# Patient Record
Sex: Male | Born: 1955 | Race: White | Hispanic: No | Marital: Married | Smoking: Former smoker
Health system: Southern US, Community
[De-identification: ages and names within clinical notes are randomized; demographics above are authoritative.]

## PROBLEM LIST (undated history)

## (undated) DIAGNOSIS — M5030 Other cervical disc degeneration, unspecified cervical region: Secondary | ICD-10-CM

## (undated) DIAGNOSIS — I499 Cardiac arrhythmia, unspecified: Secondary | ICD-10-CM

## (undated) DIAGNOSIS — Z87442 Personal history of urinary calculi: Secondary | ICD-10-CM

## (undated) DIAGNOSIS — Z889 Allergy status to unspecified drugs, medicaments and biological substances status: Secondary | ICD-10-CM

## (undated) DIAGNOSIS — E119 Type 2 diabetes mellitus without complications: Secondary | ICD-10-CM

## (undated) DIAGNOSIS — K219 Gastro-esophageal reflux disease without esophagitis: Secondary | ICD-10-CM

## (undated) DIAGNOSIS — Z9889 Other specified postprocedural states: Secondary | ICD-10-CM

## (undated) DIAGNOSIS — I10 Essential (primary) hypertension: Secondary | ICD-10-CM

## (undated) DIAGNOSIS — M1990 Unspecified osteoarthritis, unspecified site: Secondary | ICD-10-CM

## (undated) DIAGNOSIS — E785 Hyperlipidemia, unspecified: Secondary | ICD-10-CM

## (undated) DIAGNOSIS — R112 Nausea with vomiting, unspecified: Secondary | ICD-10-CM

## (undated) DIAGNOSIS — N189 Chronic kidney disease, unspecified: Secondary | ICD-10-CM

## (undated) DIAGNOSIS — R7303 Prediabetes: Secondary | ICD-10-CM

## (undated) HISTORY — PX: WISDOM TOOTH EXTRACTION: SHX21

## (undated) HISTORY — DX: Hyperlipidemia, unspecified: E78.5

## (undated) HISTORY — PX: CARDIAC CATHETERIZATION: SHX172

## (undated) HISTORY — DX: Type 2 diabetes mellitus without complications: E11.9

## (undated) HISTORY — PX: COLONOSCOPY: SHX174

---

## 1993-02-21 HISTORY — PX: ANKLE FRACTURE SURGERY: SHX122

## 1997-07-09 ENCOUNTER — Encounter: Admission: RE | Admit: 1997-07-09 | Discharge: 1997-07-09 | Payer: Self-pay | Admitting: Hematology and Oncology

## 1997-08-07 ENCOUNTER — Encounter: Admission: RE | Admit: 1997-08-07 | Discharge: 1997-08-07 | Payer: Self-pay | Admitting: Hematology and Oncology

## 1997-09-03 ENCOUNTER — Encounter: Admission: RE | Admit: 1997-09-03 | Discharge: 1997-09-03 | Payer: Self-pay | Admitting: Internal Medicine

## 1999-09-17 ENCOUNTER — Ambulatory Visit (HOSPITAL_COMMUNITY): Admission: RE | Admit: 1999-09-17 | Discharge: 1999-09-17 | Payer: Self-pay | Admitting: *Deleted

## 1999-09-17 ENCOUNTER — Encounter: Payer: Self-pay | Admitting: *Deleted

## 2000-02-22 HISTORY — PX: BACK SURGERY: SHX140

## 2000-09-18 ENCOUNTER — Encounter: Payer: Self-pay | Admitting: Neurosurgery

## 2000-09-18 ENCOUNTER — Ambulatory Visit (HOSPITAL_COMMUNITY): Admission: RE | Admit: 2000-09-18 | Discharge: 2000-09-18 | Payer: Self-pay | Admitting: Neurosurgery

## 2000-09-28 ENCOUNTER — Encounter: Admission: RE | Admit: 2000-09-28 | Discharge: 2000-09-28 | Payer: Self-pay | Admitting: Neurosurgery

## 2000-09-28 ENCOUNTER — Encounter: Payer: Self-pay | Admitting: Neurosurgery

## 2000-10-16 ENCOUNTER — Encounter: Admission: RE | Admit: 2000-10-16 | Discharge: 2000-10-16 | Payer: Self-pay | Admitting: Neurosurgery

## 2000-10-16 ENCOUNTER — Encounter: Payer: Self-pay | Admitting: Neurosurgery

## 2001-01-05 ENCOUNTER — Encounter: Payer: Self-pay | Admitting: Neurosurgery

## 2001-01-09 ENCOUNTER — Encounter: Payer: Self-pay | Admitting: Neurosurgery

## 2001-01-09 ENCOUNTER — Inpatient Hospital Stay (HOSPITAL_COMMUNITY): Admission: RE | Admit: 2001-01-09 | Discharge: 2001-01-12 | Payer: Self-pay | Admitting: Neurosurgery

## 2003-04-15 ENCOUNTER — Emergency Department (HOSPITAL_COMMUNITY): Admission: EM | Admit: 2003-04-15 | Discharge: 2003-04-15 | Payer: Self-pay | Admitting: Emergency Medicine

## 2004-03-16 ENCOUNTER — Ambulatory Visit: Payer: Self-pay | Admitting: Internal Medicine

## 2004-08-19 ENCOUNTER — Inpatient Hospital Stay (HOSPITAL_BASED_OUTPATIENT_CLINIC_OR_DEPARTMENT_OTHER): Admission: RE | Admit: 2004-08-19 | Discharge: 2004-08-19 | Payer: Self-pay | Admitting: Interventional Cardiology

## 2004-10-15 ENCOUNTER — Ambulatory Visit (HOSPITAL_COMMUNITY): Admission: RE | Admit: 2004-10-15 | Discharge: 2004-10-15 | Payer: Self-pay | Admitting: Neurosurgery

## 2005-01-03 ENCOUNTER — Ambulatory Visit: Payer: Self-pay | Admitting: Internal Medicine

## 2005-09-23 ENCOUNTER — Ambulatory Visit: Payer: Self-pay | Admitting: Internal Medicine

## 2006-02-15 ENCOUNTER — Ambulatory Visit (HOSPITAL_COMMUNITY): Admission: RE | Admit: 2006-02-15 | Discharge: 2006-02-15 | Payer: Self-pay | Admitting: Neurosurgery

## 2006-07-31 ENCOUNTER — Ambulatory Visit: Payer: Self-pay | Admitting: Internal Medicine

## 2007-12-31 ENCOUNTER — Encounter: Admission: RE | Admit: 2007-12-31 | Discharge: 2007-12-31 | Payer: Self-pay | Admitting: Neurosurgery

## 2009-01-02 ENCOUNTER — Encounter: Admission: RE | Admit: 2009-01-02 | Discharge: 2009-01-02 | Payer: Self-pay | Admitting: Neurosurgery

## 2009-02-19 ENCOUNTER — Encounter: Admission: RE | Admit: 2009-02-19 | Discharge: 2009-02-19 | Payer: Self-pay | Admitting: Neurosurgery

## 2009-07-03 ENCOUNTER — Emergency Department: Payer: Self-pay | Admitting: Emergency Medicine

## 2010-07-06 NOTE — Assessment & Plan Note (Signed)
[facility]                             PULMONARY OFFICE NOTE   TAHEEM, FRICKE                          MRN:          [id number]  DATE:07/31/2006                            DOB:          [birth year]    PROBLEMS:  Allergic rhinitis.   HISTORY:  Now off of allergy vaccine about four years. He has been worse  in the last two weeks. Nasonex had helped, but he has run out of that  also. Mostly head congestion with a lot of sneezing in the past week. He  tried over-the-counter Zyrtec with some relief. There has been no wheeze  or chest tightness.   MEDICATIONS:  He continues Diovan, Norvasc, and Vytorin.   ALLERGIES:  No medication allergy.   OBJECTIVE:  Weight 229 pounds, blood pressure 146/92, pulse 58, room air  saturation 98%. There is moderate nasal congestion, pharynx is  glandular, I do not see post-nasal drainage or nasal polyps. Voice  quality is normal. Lungs are clear. Heart sounds are normal. He is  overweight.   IMPRESSION:  Exacerbation of rhinitis. This is probably a combination of  allergy and air quality irritation.   PLAN:  1. Sudafed PE.  2. Neo-Synephrine inhalation with Depo-Medrol 80 mg IM and steroid      talk.  3. We refilled Nasonex with discussion. We are going to watch to see      how he does through this summer and fall, but for now we are      scheduling return in one year, but earlier p.r.n.     Clinton D. Maple Hudson, MD, Tonny Bollman, FACP  Electronically Signed    CDY/MedQ  DD: 08/06/2006  DT: 08/07/2006  Job #: [id number]   cc:   Lovenia Kim, D.O.

## 2010-07-09 NOTE — Op Note (Signed)
. [facility]  Patient:    John Andrade, John Andrade Visit Number: [id number] MRN: [id number]          Service Type: SUR Location: [id number] Attending Physician:  Emeterio Reeve Dictated by:   Payton Doughty, M.D. Proc. 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  POSTOPERATIVE DIAGNOSIS:  Spondylosis L5-S1, herniated disk L4-5.  OPERATION PERFORMED:  L4-5, L5-S1 laminectomy and diskectomy, posterior lumbar interbody fusion with Ray threaded fusion cage.  SURGEON:  Payton Doughty, M.D.  PREP:  Sterile Betadine prep and scrub with alcohol wipe.  COMPLICATIONS:  None.  ASSISTANT:  Kyle L. Franky Macho, M.D.  ANESTHESIA:  General.  DESCRIPTION OF PROCEDURE:  The patient is a 55 year old right-handed white male with severe right L5 radiculopathy.  He has spondylosis at 5-1 and compression of the 5 root in the foramen and herniated disk at 4-5 eccentric to the right side.  The patient was taken to the operating room, smoothly anesthetized and intubated, and placed prone on the operating table.  Following shave, prep and drape in the usual sterile fashion, the skin was infiltrated with 1% lidocaine with 1:400,000 epinephrine.  The old skin incision was reopened and extended in the cephalic direction approximately 1 cm.  The lamina and facet joints of L4, L5 and S1 were exposed bilaterally in the subperiosteal plane. Intraoperative x-ray confirmed correctness of the level.  The pars interarticularis, lamina and inferior facet of L4 and L5 and the superior facet of L5 and S1 were removed using a high speed drill and the bone set aside for grafting.  The ligamentum flavum was removed and the 4, 5 and 1 roots dissected as they rounded their respective pedicles.  On the left side, the anatomy was reasonably normal at 4-5.  At 5-1 there was  extensive degenerative change in facet arthropathy.  On the right side at 4-5 there was a herniated disk compressing the right 5 root as it traversed the disk space. At L5-S1 there was severe spondylitic disease with entrapment of the 5 root laterally underneath the pars after it had been removed.  Once the pars was removed, the nerve was freed up and dissected out lateral to the transverse processes.  The S1 root was definitely scarred down and scar was lysed and diskectomy carried out underneath it.  Diskectomy was then completed at L5-S1 and L4-5 bilaterally.  Ray threaded fusion cages were then placed, 12 x 26 at 5-1 and 14 x 26 at 4-5.  Intraoperative x-ray showed good placement of cages. They were packed with bone graft harvested from the facet joints and capped. The wound was irrigated and hemostasis assured.  A fat graft was taken and placed over the right L5 root.  The fascia was then reapproximated with 0 Vicryl in interrupted fashion.  Subcutaneous tissues reapproximated with 0 Vicryl in interrupted fashion.  Subcuticular tissues were reapproximated with 3-0 Vicryl in interrupted fashion.  The skin was closed with 3-0 nylon in running locked fashion.  Betadine Telfa dressing was applied and made occlusive with Op-Site.  The patient was transferred to the recovery room in good condition. Dictated by:   Payton Doughty, M.D. Attending Physician:  Emeterio Reeve DD:  01/09/01 TD:  01/09/01 Job: [id number]

## 2010-07-09 NOTE — Cardiovascular Report (Signed)
NAME:  John Andrade, John Andrade NO.:  [id number]   MEDICAL RECORD NO.:  [id number]          PATIENT TYPE:  OIB   LOCATION:  6501                         FACILITY:  [facility]   PHYSICIAN:  Lyn Records III, M.D.DATE OF BIRTH:  Oct 30, 1955   DATE OF PROCEDURE:  08/19/2004  DATE OF DISCHARGE:                              CARDIAC CATHETERIZATION   INDICATION FOR THE STUDY:  Recurring left precordial chest discomfort in a  55 year old gentleman with a strong family history of coronary artery  disease. Recent Cardiolite study demonstrated a small region of  reversibility of the mid anterolateral region. The study is being done to  rule out significant coronary artery disease.   PROCEDURE PERFORMED:  1.  Left heart catheterization.  2.  Selective coronary angiography.  3.  Left ventriculography.   DATE OF PROCEDURE:  August 19, 2004.   DESCRIPTION:  After informed consent, a 4-French sheath was placed in the  right femoral artery using the modified Seldinger technique. A 4-French A2  multipurpose catheter was used for hemodynamic recordings, left  ventriculography by hand injection, and selective right coronary without and  with installation of 200 mcg of intracoronary nitroglycerin. A #4 4-French  left Judkins catheter was used for left coronary angiography. The patient  tolerated the procedure without complications.   RESULTS:  1.  Hemodynamic data:      1.  The aortic pressure 140/86.      2.  Left ventricular pressure 138/11 mmHg.  2.  Coronary angiography.      1.  Left main coronary: Normal.      2.  Left anterior descending coronary: The left anterior descending          coronary artery is large, wraps around the left ventricular apex.          Gives origin to a large branching diagonal and contains minimal 20-          30% narrowing in the mid vessel both before and after this large          diagonal. No significant LAD or branch stenoses are noted.      3.   Circumflex artery: The circumflex coronary artery is large giving          origin to a single trifurcating obtuse marginal. Luminal          irregularities are noted in the mid vessel. No significant          obstructions are noted.      4.  Right coronary: The right coronary artery is a large vessel given          the PDA and two large left ventricular branches and also an acute          marginal branch. Minimal luminal irregularities are noted.  3.  Left ventriculography: Left ventricular cavity size and systolic      function are normal. Ejection fraction is greater than 60%. No      significant MR is noted.   CONCLUSION:  1.  Essentially normal coronary arteries for age.  Minimal plaquing is noted      in the left anterior descending, right and circumflex.  2.  Normal left ventricular function.   PLAN:  No further cardiac evaluation. Chest discomfort is either  musculoskeletal, GI or some other source other than cardiac. No other  specific cardiac evaluation is felt to be needed at this time.       HWS/MEDQ  D:  08/19/2004  T:  08/19/2004  Job:  [id number]   cc:   Lovenia Kim, D.O.  [address], [address]  [place]  [place] [address]  Fax: [phone]

## 2010-07-09 NOTE — Assessment & Plan Note (Signed)
[facility]                               PULMONARY OFFICE NOTE   John Andrade, John Andrade                          MRN:          [id number]  DATE:09/23/2005                            DOB:          Sep 25, 1955    PULMONARY ALLERGY FOLLOW-UP:   PROBLEM:  Allergic rhinitis.   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  he is  followed by Lovenia Kim, DO, for primary care and comes now to  establish for continuity with me at this practice.  His vaccine has been at  1:10 since 2003 with injections given by his wife.  He has had no problems  at all.  He stopped his vaccine 2 weeks ago when the date on the vials  expired.  He thinks he has been a little more sneezy in the last 2 weeks.  Over the past month he has had more sense of throat congestion in the  mornings and sometimes at other times of day.  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  Nasonex helped a great deal but he ran out.  Ears stop up at night when he  is lying down and improve when he gets up in the morning.  Wife has told him  repeatedly that he does not snore much.   MEDICATIONS:  Allergy vaccine, Diovan, Norvasc, Vytorin, Allegra 180 mg.   No medication allergies.   OBJECTIVE:  VITAL SIGNS:  Weight 248 pounds, BP 122/80, pulse regular at 71,  room air saturation 96%.  GENERAL:  He is quite obese, mildly hoarse, jiggling his left foot.  HEENT:  Conjunctivae are clear.  Nasal mucosa is quite wet, almost boggy but  not obstructed.  I see no polyps.  His pharynx is clear without erythema.  Spacing is about 2-3/4 with no stridor or thyromegaly.  CHEST:  Lung fields are clear and quiet.  CARDIAC:  Heart sounds regular without murmur or gallop.   IMPRESSION:  1.  Allergic rhinitis.  2.  Eustachian dysfunction.  3.  Hoarseness  probably reflects a combination of high voice overuse but      also I cannot exclude possible contributions from allergic rhinitis and      reflux.  This was discussed with him.   PLAN:  1.  We discussed allergy vaccine strategy and have chosen to discontinue at      this time for observation.  We can retest and restart in the future      p.r.n.  2.  We have refilled Nasonex.  3.  Reflux precautions were discussed.  4.  Schedule return with me one year, earlier p.r.n.                                   Clinton D. Maple Hudson, MD, FCCP, FACP   CDY/MedQ  DD:  09/23/2005  DT:  09/24/2005  Job #:  [id number]   cc:   Lovenia Kim, DO

## 2010-07-09 NOTE — H&P (Signed)
[place]  Patient:    John Andrade, John Andrade Visit Number: [id number] MRN: [id number]          Service Type: Attending:  Payton Doughty, M.D. Dictated by:   Payton Doughty, M.D. Adm. Date:  01/09/01                           History and Physical  ADMITTING DIAGNOSIS:  Spondylosis L4-5, L5-S1.  SERVICE:  Neurosurgery.  HISTORY OF PRESENT ILLNESS:  This is a 55 year old right-handed white gentleman who in 80 and 1992 Dr. Roxan Hockey operated on for an L5-S1 disk and recurrence.  He did well, then 1998 has had recurrence of his right leg pain with a small disk at the right side at L5-S1.  Ever since then he has episodic back pain and discomfort down his leg.  About four months ago, now, he was bending to pick up the gasoline cans and had the sharp onset of back and right lower extremity pain which has persisted until now.  Affects the left leg a little bit but mostly it is on the right and he has numbness of the right great toe.  Bending bothers it and he does not describe any difficulties with his bowel and bladder.  MRI showed disk at 4-5 slightly eccentric to the right, relatively small, with no change in his 5-1 pathology.  We tried a transforaminal and translaminar injection at L4-5 and L5-S1.  Those elicited a concordant pain response at 4-5 and worsened his pain.  He is really incapacitated with back and leg pain and is therefore admitted for a lumbar disk infusion.  MEDICAL HISTORY:  Otherwise benign.  MEDICATIONS:  He is on Tylenol for his sinuses.  OPERATIONS:  His only operations have been his lumbar disk operation and he also had an ankle operation in 1989.  SOCIAL HISTORY:  He does not smoke or drink.  He is a Education officer, environmental.  FAMILY HISTORY:  His mom is 67 in good health.  Daddy is 2 in good health after a coronary artery bypass.  REVIEW OF SYSTEMS:  Remarkable for glasses, nasal congestion, sinus headache, back pain, leg pain, and he has broken  both wrists within the past year or so playing basketball.  PHYSICAL EXAMINATION:  HEENT:  Within normal limits.  NECK:  He has reasonable range of motion of his neck.  CHEST:  Clear.  CARDIAC:  Regular rate and rhythm.  ABDOMEN:  Nontender, no hepatosplenomegaly.  EXTREMITIES:  Without clubbing or cyanosis.  GENITOURINARY:  Deferred.  PERIPHERAL PULSES:  Good.  NEUROLOGIC:  He is awake, alert, and oriented.  His cranial nerves appear to function normally.  Motor exam showed 5/5 strength throughout the upper and lower extremities save for the right dorsiflexor which is 4/5.  Sensory deficit is described in a right S1 distribution.  Reflexes are 2 at the knees, 2 at the left ankle, flicker at the right ankle.  Straight leg raising was negative in the past, is now positive on the right.  CLINICAL IMPRESSION:  Right L5 and S1 radiculopathies related to disk.  PLAN:  Lumbar laminectomy and diskectomy, posterior lumbar interbody fusion with Ray threaded fusion cages at L4-5 and L5-S1.  The risks and benefits of this approach have been discussed with him and he wished to proceed. Dictated by:   Payton Doughty, M.D. Attending:  Payton Doughty, M.D. DD:  01/09/01 TD:  01/09/01 Job: [id number]  [id number]

## 2010-07-09 NOTE — Discharge Summary (Signed)
[place]  Patient:    John Andrade, MAN Visit Number: [id number] MRN: [id number]          Service Type: Attending:  Payton Doughty, M.D. Dictated by:   Payton Doughty, M.D. Adm. Date:  01/09/01 Disc. Date: 01/12/01                             Discharge Summary  ADMISSION DIAGNOSIS:  Spondylosis L4-L5, L5-S1.  DISCHARGE DIAGNOSIS:  Spondylosis L4-L5, L5-S1.  OPERATIVE PROCEDURE:  L4-L5, L5-S1 laminectomy and diskectomy, posterior lumbar laminectomy fusion with right interbody fusion.  SERVICE:  Neurosurgery.  COMPLICATIONS:  None.  DISPOSITION: Discharged satisfied and well.  HISTORY OF PRESENT ILLNESS:  55 year old right handed white gentleman whose history and physical is recounted on the chart.  He has had diskectomies at L5-S1 several times in the past by Dr. Roxan Hockey and has new disc at L4-L5 with increasing low back pain.  Conservative measures have failed and he is admitted for fusion.  His medical history is benign.  MEDICATIONS:  Tylenol.  OPERATIONS:  Lumbar disc operation and ankle operation.  PHYSICAL EXAMINATION:  GENERAL:  Remarkable for a little bit of obesity.  NEUROLOGICAL:  Remarkable for a right L5-S1 radiculopathy.  HOSPITAL COURSE:  The patient was taken to the operating room after ascertaining normal laboratory values and underwent an L4-L5, L5-S1 laminectomy with diskectomy and posterior lumbar interbody fusion. Postoperatively he has done well.  On the first postoperative day his Foley catheter is removed.  On the second postoperative day his PCA was stopped.  He participated in physical therapy, is up, eating, walking, voiding normally. His incision is dry and clean.  He is being discharged home to the care of his family on Percocet for pain.  FOLLOW UP:  Follow up will be with [facility] office in about 10 days for sutures. Dictated by:   Payton Doughty, M.D. Attending:  Payton Doughty, M.D. DD:   01/12/01 TD:  01/12/01 Job: [id number]

## 2010-11-15 ENCOUNTER — Other Ambulatory Visit: Payer: Self-pay | Admitting: Neurosurgery

## 2010-11-15 DIAGNOSIS — M47816 Spondylosis without myelopathy or radiculopathy, lumbar region: Secondary | ICD-10-CM

## 2010-11-24 ENCOUNTER — Ambulatory Visit
Admission: RE | Admit: 2010-11-24 | Discharge: 2010-11-24 | Disposition: A | Discharge status: Home / Self Care | Payer: BC Managed Care – PPO | Source: Ambulatory Visit | Attending: Neurosurgery | Admitting: Neurosurgery

## 2010-11-24 DIAGNOSIS — M47816 Spondylosis without myelopathy or radiculopathy, lumbar region: Secondary | ICD-10-CM

## 2011-01-25 ENCOUNTER — Other Ambulatory Visit: Payer: Self-pay | Admitting: Orthopaedic Surgery

## 2011-01-25 DIAGNOSIS — M25562 Pain in left knee: Secondary | ICD-10-CM

## 2011-01-27 ENCOUNTER — Ambulatory Visit
Admission: RE | Admit: 2011-01-27 | Discharge: 2011-01-27 | Disposition: A | Discharge status: Home / Self Care | Payer: BC Managed Care – PPO | Source: Ambulatory Visit | Attending: Orthopaedic Surgery | Admitting: Orthopaedic Surgery

## 2011-01-27 DIAGNOSIS — M25562 Pain in left knee: Secondary | ICD-10-CM

## 2012-04-12 ENCOUNTER — Other Ambulatory Visit: Payer: Self-pay

## 2012-04-18 ENCOUNTER — Encounter (HOSPITAL_BASED_OUTPATIENT_CLINIC_OR_DEPARTMENT_OTHER): Payer: Self-pay | Admitting: *Deleted

## 2012-04-18 NOTE — Progress Notes (Signed)
04/18/12 1404  OBSTRUCTIVE SLEEP APNEA  Have you ever been diagnosed with sleep apnea through a sleep study? No  Do you snore loudly (loud enough to be heard through closed doors)?  0  Do you often feel tired, fatigued, or sleepy during the daytime? 0  Has anyone observed you stop breathing during your sleep? 0  Do you have, or are you being treated for high blood pressure? 1  BMI more than 35 kg/m2? 1  Age over 55 years old? 1  Gender: 1  Obstructive Sleep Apnea Score 4  Score 4 or greater  Results sent to PCP

## 2012-04-18 NOTE — Progress Notes (Signed)
To come in for bmet-ekg Had cath 2006 after father had MI-was clean

## 2012-04-18 NOTE — H&P (Signed)
John Andrade is an 57 y.o. male.   Chief Complaint: Left knee pain and pop HPI: Time he is having continued left knee pain.  He is been bothered her probably more than a year with pain and a pop with activity.  He has had one injection which helped only temporarily and has been on anti-inflammatory medications.  He has trouble if he is doing any twisting or kneeling or climbing activities.  MRI scan done previously was relatively benign.  We have discussed proceeding with a knee arthroscopy looking for torn meniscus.  Past Medical History  Diagnosis Date  . DDD (degenerative disc disease), cervical   . DJD (degenerative joint disease)   . Wears glasses   . Hypertension   . Multiple allergies   . GERD (gastroesophageal reflux disease)     Past Surgical History  Procedure Laterality Date  . Back surgery  2002    lumb lam-fusion  . Ankle fracture surgery  1995    right  . Colonoscopy      History reviewed. No pertinent family history. Social History:  reports that he has never smoked. He does not have any smokeless tobacco history on file. He reports that he does not drink alcohol or use illicit drugs.  Allergies: No Known Allergies  No prescriptions prior to admission    No results found for this or any previous visit (from the past 48 hour(s)). No results found.  Review of Systems  Constitutional: Negative.   HENT: Negative.   Eyes: Negative.   Respiratory: Negative.   Cardiovascular: Negative.   Gastrointestinal: Negative.   Genitourinary: Negative.   Musculoskeletal: Positive for joint pain.  Skin: Negative.   Neurological: Negative.   Endo/Heme/Allergies: Negative.   Psychiatric/Behavioral: Negative.     Height 5\' 6"  (1.676 m), weight 104.327 kg (230 lb). Physical Exam  Constitutional: He is oriented to person, place, and time. He appears well-nourished.  HENT:  Head: Atraumatic.  Eyes: EOM are normal.  Neck: Neck supple.  Cardiovascular: Regular rhythm.    Respiratory: Breath sounds normal.  GI: Soft. Bowel sounds are normal.  Musculoskeletal:  Left knee exam: Range of motion 0-1 35.  No effusion.  Very significant pop when he twists his knee.  He does have some anteromedial pain.  No significant crepitation.  Good neurovascular status distally.  Neurological: He is oriented to person, place, and time.  Skin: Skin is dry.  Psychiatric: He has a normal mood and affect.     Assessment/Plan Assessment: Left knee pop with benign MRI scan possible torn meniscus Plan.  We have discussed proceeding with a arthroscopy for diagnostic and therapeutic purposes.  I reviewed the risks of anesthesia and infection and potential DVT related to knee arthroscopy.  Will need postoperative physical therapy to optimize results.  Desire Fulp R 04/18/2012, 5:26 PM

## 2012-04-23 ENCOUNTER — Encounter (HOSPITAL_BASED_OUTPATIENT_CLINIC_OR_DEPARTMENT_OTHER)
Admission: RE | Admit: 2012-04-23 | Discharge: 2012-04-23 | Disposition: A | Discharge status: Home / Self Care | Payer: BC Managed Care – PPO | Source: Ambulatory Visit | Attending: Orthopaedic Surgery | Admitting: Orthopaedic Surgery

## 2012-04-23 ENCOUNTER — Encounter (HOSPITAL_BASED_OUTPATIENT_CLINIC_OR_DEPARTMENT_OTHER): Admission: RE | Admit: 2012-04-23 | Payer: BC Managed Care – PPO | Source: Ambulatory Visit

## 2012-04-23 LAB — BASIC METABOLIC PANEL
BUN: 14 mg/dL (ref 6–23)
CO2: 24 mEq/L (ref 19–32)
Calcium: 9.6 mg/dL (ref 8.4–10.5)
Chloride: 105 mEq/L (ref 96–112)
Creatinine, Ser: 0.85 mg/dL (ref 0.50–1.35)
GFR calc Af Amer: >90 mL/min (ref >90)
GFR calc non Af Amer: >90 mL/min (ref >90)
Glucose, Bld: 112 mg/dL — ABNORMAL HIGH (ref 70–99)
Potassium: 4 mEq/L (ref 3.5–5.1)
Sodium: 139 mEq/L (ref 135–145)

## 2012-04-24 ENCOUNTER — Encounter (HOSPITAL_BASED_OUTPATIENT_CLINIC_OR_DEPARTMENT_OTHER): Payer: Self-pay | Admitting: *Deleted

## 2012-04-24 ENCOUNTER — Encounter (HOSPITAL_BASED_OUTPATIENT_CLINIC_OR_DEPARTMENT_OTHER)
Admission: RE | Disposition: A | Discharge status: Home / Self Care | Payer: Self-pay | Source: Ambulatory Visit | Attending: Orthopaedic Surgery

## 2012-04-24 ENCOUNTER — Ambulatory Visit (HOSPITAL_BASED_OUTPATIENT_CLINIC_OR_DEPARTMENT_OTHER): Payer: BC Managed Care – PPO | Admitting: Anesthesiology

## 2012-04-24 ENCOUNTER — Ambulatory Visit (HOSPITAL_BASED_OUTPATIENT_CLINIC_OR_DEPARTMENT_OTHER)
Admission: RE | Admit: 2012-04-24 | Discharge: 2012-04-24 | Disposition: A | Discharge status: Home / Self Care | Payer: BC Managed Care – PPO | Source: Ambulatory Visit | Attending: Orthopaedic Surgery | Admitting: Orthopaedic Surgery

## 2012-04-24 ENCOUNTER — Encounter (HOSPITAL_BASED_OUTPATIENT_CLINIC_OR_DEPARTMENT_OTHER): Payer: Self-pay | Admitting: Anesthesiology

## 2012-04-24 DIAGNOSIS — M2240 Chondromalacia patellae, unspecified knee: Secondary | ICD-10-CM | POA: Insufficient documentation

## 2012-04-24 DIAGNOSIS — M23329 Other meniscus derangements, posterior horn of medial meniscus, unspecified knee: Secondary | ICD-10-CM | POA: Insufficient documentation

## 2012-04-24 DIAGNOSIS — M1990 Unspecified osteoarthritis, unspecified site: Secondary | ICD-10-CM | POA: Insufficient documentation

## 2012-04-24 DIAGNOSIS — M5030 Other cervical disc degeneration, unspecified cervical region: Secondary | ICD-10-CM | POA: Insufficient documentation

## 2012-04-24 DIAGNOSIS — I10 Essential (primary) hypertension: Secondary | ICD-10-CM | POA: Insufficient documentation

## 2012-04-24 DIAGNOSIS — M23322 Other meniscus derangements, posterior horn of medial meniscus, left knee: Secondary | ICD-10-CM

## 2012-04-24 HISTORY — DX: Essential (primary) hypertension: I10

## 2012-04-24 HISTORY — DX: Other cervical disc degeneration, unspecified cervical region: M50.30

## 2012-04-24 HISTORY — DX: Gastro-esophageal reflux disease without esophagitis: K21.9

## 2012-04-24 HISTORY — DX: Unspecified osteoarthritis, unspecified site: M19.90

## 2012-04-24 HISTORY — DX: Allergy status to unspecified drugs, medicaments and biological substances: Z88.9

## 2012-04-24 HISTORY — PX: KNEE ARTHROSCOPY: SHX127

## 2012-04-24 LAB — POCT HEMOGLOBIN-HEMACUE: Hemoglobin: 15 g/dL (ref 13.0–17.0)

## 2012-04-24 SURGERY — ARTHROSCOPY, KNEE
Anesthesia: General | Site: Knee | Laterality: Left | Wound class: Clean

## 2012-04-24 MED ORDER — PROPOFOL 10 MG/ML IV BOLUS
INTRAVENOUS | Status: DC | PRN
  Administered 2012-04-24: 300 mg via INTRAVENOUS

## 2012-04-24 MED ORDER — OXYCODONE-ACETAMINOPHEN 5-325 MG PO TABS: 1.0000 | ORAL_TABLET | ORAL | Status: DC | PRN

## 2012-04-24 MED ORDER — MIDAZOLAM HCL 5 MG/5ML IJ SOLN
INTRAMUSCULAR | Status: DC | PRN
  Administered 2012-04-24: 2 mg via INTRAVENOUS

## 2012-04-24 MED ORDER — OXYCODONE HCL 5 MG PO TABS
5.0000 mg | ORAL_TABLET | Freq: Once | ORAL | Status: AC | PRN
  Administered 2012-04-24: 5 mg via ORAL

## 2012-04-24 MED ORDER — CHLORHEXIDINE GLUCONATE 4 % EX LIQD: 60.0000 mL | Freq: Once | CUTANEOUS | Status: DC

## 2012-04-24 MED ORDER — DEXAMETHASONE SODIUM PHOSPHATE 4 MG/ML IJ SOLN
INTRAMUSCULAR | Status: DC | PRN
  Administered 2012-04-24: 10 mg via INTRAVENOUS

## 2012-04-24 MED ORDER — LIDOCAINE HCL (CARDIAC) 20 MG/ML IV SOLN
INTRAVENOUS | Status: DC | PRN
  Administered 2012-04-24: 100 mg via INTRAVENOUS

## 2012-04-24 MED ORDER — ONDANSETRON HCL 4 MG/2ML IJ SOLN
INTRAMUSCULAR | Status: DC | PRN
  Administered 2012-04-24: 4 mg via INTRAVENOUS

## 2012-04-24 MED ORDER — MIDAZOLAM HCL 2 MG/2ML IJ SOLN: 1.0000 mg | INTRAMUSCULAR | Status: DC | PRN

## 2012-04-24 MED ORDER — CEFAZOLIN SODIUM-DEXTROSE 2-3 GM-% IV SOLR
2.0000 g | INTRAVENOUS | Status: AC
  Administered 2012-04-24: 2 g via INTRAVENOUS

## 2012-04-24 MED ORDER — ONDANSETRON HCL 4 MG/2ML IJ SOLN: 4.0000 mg | Freq: Once | INTRAMUSCULAR | Status: DC | PRN

## 2012-04-24 MED ORDER — LACTATED RINGERS IV SOLN
INTRAVENOUS | Status: DC
  Administered 2012-04-24: 08:00:00 via INTRAVENOUS

## 2012-04-24 MED ORDER — SODIUM CHLORIDE 0.9 % IR SOLN
Status: DC | PRN
  Administered 2012-04-24: 1500 mL

## 2012-04-24 MED ORDER — BUPIVACAINE-EPINEPHRINE 0.5% -1:200000 IJ SOLN
INTRAMUSCULAR | Status: DC | PRN
  Administered 2012-04-24: 20 mL

## 2012-04-24 MED ORDER — MEPERIDINE HCL 25 MG/ML IJ SOLN: 6.2500 mg | INTRAMUSCULAR | Status: DC | PRN

## 2012-04-24 MED ORDER — HYDROMORPHONE HCL PF 1 MG/ML IJ SOLN
0.2500 mg | INTRAMUSCULAR | Status: DC | PRN
  Administered 2012-04-24 (×3): 0.5 mg via INTRAVENOUS

## 2012-04-24 MED ORDER — FENTANYL CITRATE 0.05 MG/ML IJ SOLN: 50.0000 ug | INTRAMUSCULAR | Status: DC | PRN

## 2012-04-24 MED ORDER — KETOROLAC TROMETHAMINE 30 MG/ML IJ SOLN
INTRAMUSCULAR | Status: DC | PRN
  Administered 2012-04-24: 30 mg via INTRAVENOUS

## 2012-04-24 MED ORDER — FENTANYL CITRATE 0.05 MG/ML IJ SOLN
INTRAMUSCULAR | Status: DC | PRN
  Administered 2012-04-24 (×2): 50 ug via INTRAVENOUS

## 2012-04-24 MED ORDER — LACTATED RINGERS IV SOLN
INTRAVENOUS | Status: DC
  Administered 2012-04-24: 07:00:00 via INTRAVENOUS

## 2012-04-24 MED ORDER — OXYCODONE HCL 5 MG/5ML PO SOLN: 5.0000 mg | Freq: Once | ORAL | Status: AC | PRN

## 2012-04-24 SURGICAL SUPPLY — 42 items
BANDAGE ELASTIC 6 VELCRO ST LF (GAUZE/BANDAGES/DRESSINGS) ×2 IMPLANT
BANDAGE GAUZE ELAST BULKY 4 IN (GAUZE/BANDAGES/DRESSINGS) ×2 IMPLANT
BLADE CUDA 5.5 (BLADE) IMPLANT
BLADE GREAT WHITE 4.2 (BLADE) ×2 IMPLANT
CANISTER OMNI JUG 16 LITER (MISCELLANEOUS) ×2 IMPLANT
CANISTER SUCTION 2500CC (MISCELLANEOUS) IMPLANT
DRAPE ARTHROSCOPY W/POUCH 114 (DRAPES) ×2 IMPLANT
DRAPE U-SHAPE 47X51 STRL (DRAPES) ×2 IMPLANT
DRSG EMULSION OIL 3X3 NADH (GAUZE/BANDAGES/DRESSINGS) ×2 IMPLANT
DURAPREP 26ML APPLICATOR (WOUND CARE) ×2 IMPLANT
ELECT MENISCUS 165MM 90D (ELECTRODE) IMPLANT
ELECT REM PT RETURN 9FT ADLT (ELECTROSURGICAL)
ELECTRODE REM PT RTRN 9FT ADLT (ELECTROSURGICAL) IMPLANT
GLOVE BIO SURGEON STRL SZ 6.5 (GLOVE) ×2 IMPLANT
GLOVE BIO SURGEON STRL SZ8.5 (GLOVE) ×2 IMPLANT
GLOVE BIOGEL PI IND STRL 7.0 (GLOVE) ×1 IMPLANT
GLOVE BIOGEL PI IND STRL 8 (GLOVE) ×1 IMPLANT
GLOVE BIOGEL PI IND STRL 8.5 (GLOVE) ×1 IMPLANT
GLOVE BIOGEL PI INDICATOR 7.0 (GLOVE) ×1
GLOVE BIOGEL PI INDICATOR 8 (GLOVE) ×1
GLOVE BIOGEL PI INDICATOR 8.5 (GLOVE) ×1
GLOVE EXAM NITRILE EXT CUFF MD (GLOVE) ×2 IMPLANT
GLOVE SS BIOGEL STRL SZ 8 (GLOVE) ×1 IMPLANT
GLOVE SUPERSENSE BIOGEL SZ 8 (GLOVE) ×1
GOWN BRE IMP PREV XXLGXLNG (GOWN DISPOSABLE) ×2 IMPLANT
GOWN PREVENTION PLUS XLARGE (GOWN DISPOSABLE) ×2 IMPLANT
GOWN PREVENTION PLUS XXLARGE (GOWN DISPOSABLE) ×2 IMPLANT
IV NS IRRIG 3000ML ARTHROMATIC (IV SOLUTION) ×2 IMPLANT
KNEE WRAP E Z 3 GEL PACK (MISCELLANEOUS) ×2 IMPLANT
PACK ARTHROSCOPY DSU (CUSTOM PROCEDURE TRAY) ×2 IMPLANT
PACK BASIN DAY SURGERY FS (CUSTOM PROCEDURE TRAY) ×2 IMPLANT
PENCIL BUTTON HOLSTER BLD 10FT (ELECTRODE) IMPLANT
SET ARTHROSCOPY TUBING (MISCELLANEOUS) ×1
SET ARTHROSCOPY TUBING LN (MISCELLANEOUS) ×1 IMPLANT
SHEET MEDIUM DRAPE 40X70 STRL (DRAPES) ×2 IMPLANT
SPONGE GAUZE 4X4 12PLY (GAUZE/BANDAGES/DRESSINGS) ×2 IMPLANT
SYR 3ML 18GX1 1/2 (SYRINGE) IMPLANT
TOWEL OR 17X24 6PK STRL BLUE (TOWEL DISPOSABLE) ×2 IMPLANT
TOWEL OR NON WOVEN STRL DISP B (DISPOSABLE) ×2 IMPLANT
WAND 30 DEG SABER W/CORD (SURGICAL WAND) IMPLANT
WAND STAR VAC 90 (SURGICAL WAND) IMPLANT
WATER STERILE IRR 1000ML POUR (IV SOLUTION) ×2 IMPLANT

## 2012-04-24 NOTE — Anesthesia Postprocedure Evaluation (Signed)
Anesthesia Post Note  Patient: John Andrade  Procedure(s) Performed: Procedure(s) (LRB): ARTHROSCOPY KNEE, PARTIAL MEDIAL MENISECTOMY AND CHONDROPLASTY (Left)  Anesthesia type: general  Patient location: PACU  Post pain: Pain level controlled  Post assessment: Patient's Cardiovascular Status Stable  Last Vitals:  Filed Vitals:   04/24/12 0927  BP: 136/88  Pulse: 55  Temp: 36.7 C  Resp: 20    Post vital signs: Reviewed and stable  Level of consciousness: sedated  Complications: No apparent anesthesia complications

## 2012-04-24 NOTE — Anesthesia Preprocedure Evaluation (Signed)
Anesthesia Evaluation  Patient identified by MRN, date of birth, ID band Patient awake    Reviewed: Allergy & Precautions, H&P , NPO status , Patient's Chart, lab work & pertinent test results  Airway Mallampati: II TM Distance: >3 FB Neck ROM: Full    Dental   Pulmonary          Cardiovascular hypertension, Pt. on medications     Neuro/Psych    GI/Hepatic GERD-  Medicated and Controlled,  Endo/Other    Renal/GU      Musculoskeletal   Abdominal   Peds  Hematology   Anesthesia Other Findings   Reproductive/Obstetrics                           Anesthesia Physical Anesthesia Plan  ASA: III  Anesthesia Plan: General   Post-op Pain Management:    Induction: Intravenous  Airway Management Planned: LMA  Additional Equipment:   Intra-op Plan:   Post-operative Plan: Extubation in OR  Informed Consent: I have reviewed the patients History and Physical, chart, labs and discussed the procedure including the risks, benefits and alternatives for the proposed anesthesia with the patient or authorized representative who has indicated his/her understanding and acceptance.     Plan Discussed with: CRNA and Surgeon  Anesthesia Plan Comments:         Anesthesia Quick Evaluation  

## 2012-04-24 NOTE — Interval H&P Note (Signed)
History and Physical Interval Note:  04/24/2012 7:22 AM  John Andrade  has presented today for surgery, with the diagnosis of LEFT KNEE MEDIAL MENISCAL TEAR NAD CHONDRAMALACIA  The various methods of treatment have been discussed with the patient and family. After consideration of risks, benefits and other options for treatment, the patient has consented to  Procedure(s): ARTHROSCOPY KNEE (Left) as a surgical intervention .  The patient's history has been reviewed, patient examined, no change in status, stable for surgery.  I have reviewed the patient's chart and labs.  Questions were answered to the patient's satisfaction.     DALLDORF,PETER G

## 2012-04-24 NOTE — Transfer of Care (Signed)
Immediate Anesthesia Transfer of Care Note  Patient: John Andrade  Procedure(s) Performed: Procedure(s): ARTHROSCOPY KNEE, PARTIAL MEDIAL MENISECTOMY AND CHONDROPLASTY (Left)  Patient Location: PACU  Anesthesia Type:General  Level of Consciousness: awake  Airway & Oxygen Therapy: Patient Spontanous Breathing and Patient connected to face mask oxygen  Post-op Assessment: Report given to PACU RN and Post -op Vital signs reviewed and stable  Post vital signs: Reviewed and stable  Complications: No apparent anesthesia complications

## 2012-04-24 NOTE — Anesthesia Procedure Notes (Signed)
Procedure Name: LMA Insertion Performed by: York Grice Pre-anesthesia Checklist: Patient identified, Timeout performed, Emergency Drugs available, Suction available and Patient being monitored Patient Re-evaluated:Patient Re-evaluated prior to inductionOxygen Delivery Method: Circle system utilized Preoxygenation: Pre-oxygenation with 100% oxygen Intubation Type: IV induction Ventilation: Mask ventilation without difficulty LMA: LMA with gastric port inserted LMA Size: 4.0 Number of attempts: 1 Tube secured with: Tape Dental Injury: Teeth and Oropharynx as per pre-operative assessment

## 2012-04-24 NOTE — Op Note (Signed)
#  [id number] 

## 2012-04-25 ENCOUNTER — Encounter (HOSPITAL_BASED_OUTPATIENT_CLINIC_OR_DEPARTMENT_OTHER): Payer: Self-pay | Admitting: Orthopaedic Surgery

## 2012-04-25 NOTE — Op Note (Signed)
NAME:  John Andrade, John Andrade NO.:  [id number]  MEDICAL RECORD NO.:  [id number]  LOCATION:                                 FACILITY:  PHYSICIAN:  Lubertha Basque. Dalldorf, M.D.DATE OF BIRTH:  1955/06/01  DATE OF PROCEDURE:  04/24/2012 DATE OF DISCHARGE:                              OPERATIVE REPORT   PREOPERATIVE DIAGNOSES: 1. Left knee torn meniscus. 2. Left knee chondromalacia.  POSTOPERATIVE DIAGNOSES: 1. Left knee torn medial meniscus. 2. Left knee chondromalacia of patella.  PROCEDURES: 1. Left knee partial medial meniscectomy. 2. Left knee abrasion chondroplasty, patellofemoral.  ANESTHESIA:  General.  ATTENDING SURGEON:  Lubertha Basque. Jerl Santos, M.D.  ASSISTING:  Lindwood Qua, PA-C  INDICATION FOR PROCEDURE:  The patient is a 57 year old man with a couple of years of left knee pain and popping.  About a year ago, he underwent an MRI scan, which was relatively normal.  He was treated with some conservative measures and told to live with the problem. Unfortunately, he persisted with a painful pop and presented again about a month ago.  We have offered him an arthroscopy.  Informed operative consent was obtained after discussion of possible complications including reaction to anesthesia and infection.  SUMMARY OF FINDINGS AND PROCEDURE:  Under general anesthesia, an arthroscopy of the left knee was performed.  The predominant finding was a displaceable medial meniscus tear involving the posterior horn.  This was addressed with about 10% partial medial meniscectomy.  He had some mild chondromalacia in this compartment.  The ACL was normal.  The lateral compartment showed no evidence of meniscal or articular cartilage injury.  He had some chondromalacia of patellofemoral addressed with abrasion to bleeding bone and a tiny area and a chondroplasty, but the patella tracked relatively well.  He was discharged home the same day.  DESCRIPTION OF PROCEDURE:  The  patient was taken to the operating suite, where general anesthetic was applied without difficulty.  He was positioned in the supine and prepped and draped in normal sterile fashion.  After administration of IV Kefzol, and an appropriate time- out, an arthroscopy of the left knee was performed through total of 2 portals.  Findings were as noted above and procedure consisted predominantly of the partial medial meniscectomy which was done with a basket and shaver back to stable tissues.  We also performed the chondroplasties and abrasion as indicated and described above.  The knee was thoroughly irrigated, followed by placement of Marcaine with epinephrine and morphine.  Adaptic was placed over the portals, followed by dry gauze and a loose Ace wrap.  Estimated blood loss and intraoperative fluids can be obtained from anesthesia records.  DISPOSITION:  The patient was extubated in the operating room and taken to recovery room in stable addition.  He was to go home the same day and follow up in the office closely.  I will contact him by phone tonight.     Lubertha Basque Jerl Santos, M.D.     PGD/MEDQ  D:  04/24/2012  T:  04/24/2012  Job:  [id number]

## 2012-12-02 ENCOUNTER — Encounter: Payer: Self-pay | Admitting: *Deleted

## 2012-12-31 ENCOUNTER — Encounter: Payer: Self-pay | Admitting: Emergency Medicine

## 2012-12-31 ENCOUNTER — Ambulatory Visit: Payer: BC Managed Care – PPO | Admitting: Emergency Medicine

## 2012-12-31 ENCOUNTER — Ambulatory Visit: Payer: Self-pay | Admitting: Emergency Medicine

## 2012-12-31 VITALS — BP 138/92 | HR 68 | Temp 98.2°F | Resp 18 | Ht 65.0 in | Wt 252.0 lb

## 2012-12-31 DIAGNOSIS — I10 Essential (primary) hypertension: Secondary | ICD-10-CM | POA: Insufficient documentation

## 2012-12-31 DIAGNOSIS — E785 Hyperlipidemia, unspecified: Secondary | ICD-10-CM

## 2012-12-31 DIAGNOSIS — R972 Elevated prostate specific antigen [PSA]: Secondary | ICD-10-CM

## 2012-12-31 DIAGNOSIS — E559 Vitamin D deficiency, unspecified: Secondary | ICD-10-CM

## 2012-12-31 DIAGNOSIS — E1169 Type 2 diabetes mellitus with other specified complication: Secondary | ICD-10-CM | POA: Insufficient documentation

## 2012-12-31 DIAGNOSIS — E669 Obesity, unspecified: Secondary | ICD-10-CM

## 2012-12-31 LAB — CBC WITH DIFFERENTIAL/PLATELET
Basophils Absolute: 0 10*3/uL (ref 0.0–0.1)
Basophils Relative: 0 % (ref 0–1)
Eosinophils Absolute: 0.2 10*3/uL (ref 0.0–0.7)
Eosinophils Relative: 3 % (ref 0–5)
HCT: 44.4 % (ref 39.0–52.0)
Hemoglobin: 15.5 g/dL (ref 13.0–17.0)
Lymphocytes Relative: 41 % (ref 12–46)
Lymphs Abs: 2.9 10*3/uL (ref 0.7–4.0)
MCH: 29.2 pg (ref 26.0–34.0)
MCHC: 34.9 g/dL (ref 30.0–36.0)
MCV: 83.8 fL (ref 78.0–100.0)
Monocytes Absolute: 0.7 10*3/uL (ref 0.1–1.0)
Monocytes Relative: 10 % (ref 3–12)
Neutro Abs: 3.2 10*3/uL (ref 1.7–7.7)
Neutrophils Relative %: 46 % (ref 43–77)
Platelets: 315 10*3/uL (ref 150–400)
RBC: 5.3 MIL/uL (ref 4.22–5.81)
RDW: 13.4 % (ref 11.5–15.5)
WBC: 7 10*3/uL (ref 4.0–10.5)

## 2012-12-31 NOTE — Addendum Note (Signed)
Addended by: Loree Fee R on: 12/31/2012 04:31 PM   Modules accepted: Level of Service, SmartSet

## 2012-12-31 NOTE — Patient Instructions (Signed)
Fat and Cholesterol Control Diet Fat and cholesterol levels in your blood and organs are influenced by your diet. High levels of fat and cholesterol may lead to diseases of the heart, small and large blood vessels, gallbladder, liver, and pancreas. CONTROLLING FAT AND CHOLESTEROL WITH DIET Although exercise and lifestyle factors are important, your diet is key. That is because certain foods are known to raise cholesterol and others to lower it. The goal is to balance foods for their effect on cholesterol and more importantly, to replace saturated and trans fat with other types of fat, such as monounsaturated fat, polyunsaturated fat, and omega-3 fatty acids. On average, a person should consume no more than 15 to 17 g of saturated fat daily. Saturated and trans fats are considered "bad" fats, and they will raise LDL cholesterol. Saturated fats are primarily found in animal products such as meats, butter, and cream. However, that does not mean you need to give up all your favorite foods. Today, there are good tasting, low-fat, low-cholesterol substitutes for most of the things you like to eat. Choose low-fat or nonfat alternatives. Choose round or loin cuts of red meat. These types of cuts are lowest in fat and cholesterol. Chicken (without the skin), fish, veal, and ground turkey breast are great choices. Eliminate fatty meats, such as hot dogs and salami. Even shellfish have little or no saturated fat. Have a 3 oz (85 g) portion when you eat lean meat, poultry, or fish. Trans fats are also called "partially hydrogenated oils." They are oils that have been scientifically manipulated so that they are solid at room temperature resulting in a longer shelf life and improved taste and texture of foods in which they are added. Trans fats are found in stick margarine, some tub margarines, cookies, crackers, and baked goods.  When baking and cooking, oils are a great substitute for butter. The monounsaturated oils are  especially beneficial since it is believed they lower LDL and raise HDL. The oils you should avoid entirely are saturated tropical oils, such as coconut and palm.  Remember to eat a lot from food groups that are naturally free of saturated and trans fat, including fish, fruit, vegetables, beans, grains (barley, rice, couscous, bulgur wheat), and pasta (without cream sauces).  IDENTIFYING FOODS THAT LOWER FAT AND CHOLESTEROL  Soluble fiber may lower your cholesterol. This type of fiber is found in fruits such as apples, vegetables such as broccoli, potatoes, and carrots, legumes such as beans, peas, and lentils, and grains such as barley. Foods fortified with plant sterols (phytosterol) may also lower cholesterol. You should eat at least 2 g per day of these foods for a cholesterol lowering effect.  Read package labels to identify low-saturated fats, trans fat free, and low-fat foods at the supermarket. Select cheeses that have only 2 to 3 g saturated fat per ounce. Use a heart-healthy tub margarine that is free of trans fats or partially hydrogenated oil. When buying baked goods (cookies, crackers), avoid partially hydrogenated oils. Breads and muffins should be made from whole grains (whole-wheat or whole oat flour, instead of "flour" or "enriched flour"). Buy non-creamy canned soups with reduced salt and no added fats.  FOOD PREPARATION TECHNIQUES  Never deep-fry. If you must fry, either stir-fry, which uses very little fat, or use non-stick cooking sprays. When possible, broil, bake, or roast meats, and steam vegetables. Instead of putting butter or margarine on vegetables, use lemon and herbs, applesauce, and cinnamon (for squash and sweet potatoes). Use nonfat   yogurt, salsa, and low-fat dressings for salads.  LOW-SATURATED FAT / LOW-FAT FOOD SUBSTITUTES Meats / Saturated Fat (g)  Avoid: Steak, marbled (3 oz/85 g) / 11 g  Choose: Steak, lean (3 oz/85 g) / 4 g  Avoid: Hamburger (3 oz/85 g) / 7  g  Choose: Hamburger, lean (3 oz/85 g) / 5 g  Avoid: Ham (3 oz/85 g) / 6 g  Choose: Ham, lean cut (3 oz/85 g) / 2.4 g  Avoid: Chicken, with skin, dark meat (3 oz/85 g) / 4 g  Choose: Chicken, skin removed, dark meat (3 oz/85 g) / 2 g  Avoid: Chicken, with skin, light meat (3 oz/85 g) / 2.5 g  Choose: Chicken, skin removed, light meat (3 oz/85 g) / 1 g Dairy / Saturated Fat (g)  Avoid: Whole milk (1 cup) / 5 g  Choose: Low-fat milk, 2% (1 cup) / 3 g  Choose: Low-fat milk, 1% (1 cup) / 1.5 g  Choose: Skim milk (1 cup) / 0.3 g  Avoid: Hard cheese (1 oz/28 g) / 6 g  Choose: Skim milk cheese (1 oz/28 g) / 2 to 3 g  Avoid: Cottage cheese, 4% fat (1 cup) / 6.5 g  Choose: Low-fat cottage cheese, 1% fat (1 cup) / 1.5 g  Avoid: Ice cream (1 cup) / 9 g  Choose: Sherbet (1 cup) / 2.5 g  Choose: Nonfat frozen yogurt (1 cup) / 0.3 g  Choose: Frozen fruit bar / trace  Avoid: Whipped cream (1 tbs) / 3.5 g  Choose: Nondairy whipped topping (1 tbs) / 1 g Condiments / Saturated Fat (g)  Avoid: Mayonnaise (1 tbs) / 2 g  Choose: Low-fat mayonnaise (1 tbs) / 1 g  Avoid: Butter (1 tbs) / 7 g  Choose: Extra light margarine (1 tbs) / 1 g  Avoid: Coconut oil (1 tbs) / 11.8 g  Choose: Olive oil (1 tbs) / 1.8 g  Choose: Corn oil (1 tbs) / 1.7 g  Choose: Safflower oil (1 tbs) / 1.2 g  Choose: Sunflower oil (1 tbs) / 1.4 g  Choose: Soybean oil (1 tbs) / 2.4 g  Choose: Canola oil (1 tbs) / 1 g Document Released: 02/07/2005 Document Revised: 06/04/2012 Document Reviewed: 07/29/2010 [employer] Patient Information 2014 [employer]. Obesity Obesity is having too much body fat and a body mass index (BMI) of 30 or more. BMI is a number based on your height and weight. The number is an estimate of how much body fat you have. Obesity can happen if you eat more calories than you can burn by exercising or other activity. It can cause major health problems or emergencies.  HOME  CARE  Exercise and be active as told by your doctor. Try:  Using stairs when you can.  Parking farther away from store doors.  Gardening, biking, or walking.  Eat healthy foods and drinks that are low in calories. Eat more fruits and vegetables.  Limit fast food, sweets, and snack foods that are made with ingredients that are not natural (processed food).  Eat smaller amounts of food.  Keep a journal and write down what you eat every day. Websites can help with this.  Avoid drinking alcohol. Drink more water and drinks without calories.   Take vitamins and dietary pills (supplements) only as told by your doctor.  Try going to weight-loss support groups or classes to help lessen stress. Dieticians and counselors may also help. GET HELP RIGHT AWAY IF:  You have chest   pain or tightness.  You have trouble breathing or feel short of breath.  You feel weak or have loss of feeling (numbness) in your legs.  You feel confused or have trouble talking.  You have sudden changes in your vision. MAKE SURE YOU:  Understand these instructions.  Will watch your condition.  Will get help right away if you are not doing well or get worse. Document Released: 05/02/2011 Document Reviewed: 05/02/2011 [employer] Patient Information 2014 [employer].  

## 2012-12-31 NOTE — Progress Notes (Addendum)
Subjective:    Patient ID: John Andrade, male    DOB: [birth year], 57 y.o.   MRN: [id number]  HPI Comments: 57 YO OBESE MALE presents for 3 month F/U for HTN, Cholesterol, Pre-Dm, D. Deficient. He has tried to decrease wt thru portion sizes and multiple diet plans/ herbals/ RX without significant improvement. He is not exercising and notes Right elbow has bee painful on/ off for weeks, without history of trauma. He tried Zetia and does not recall any SE, but did not refill RX AD. He did not  add B12 AD, but notes he's feeling well overall. BP at home is good.        Current Outpatient Prescriptions on File Prior to Visit  Medication Sig Dispense Refill  . acetaminophen (TYLENOL) 500 MG tablet Take 500 mg by mouth every 6 (six) hours as needed for pain.      Marland Kitchen amLODipine (NORVASC) 10 MG tablet Take 10 mg by mouth every evening.      . cholecalciferol (VITAMIN D) 1000 UNITS tablet Take 1,000 Units by mouth daily. 8,000iu daily      . lisinopril (PRINIVIL,ZESTRIL) 20 MG tablet Take 20 mg by mouth every evening.      . loratadine (CLARITIN) 10 MG tablet Take 10 mg by mouth every evening.      Marland Kitchen omeprazole (PRILOSEC) 40 MG capsule Take 40 mg by mouth daily.      . mometasone (NASONEX) 50 MCG/ACT nasal spray Place 2 sprays into the nose daily.      . montelukast (SINGULAIR) 10 MG tablet Take 10 mg by mouth at bedtime.      Marland Kitchen oxyCODONE-acetaminophen (ROXICET) 5-325 MG per tablet Take 1 tablet by mouth every 4 (four) hours as needed for pain.  40 tablet  0   No current facility-administered medications on file prior to visit.   ALLERGIES Phentermine; Pravastatin; and Zocor  Past Medical History  Diagnosis Date  . DDD (degenerative disc disease), cervical   . DJD (degenerative joint disease)   . Wears glasses   . Hypertension   . Multiple allergies   . GERD (gastroesophageal reflux disease)   . Hyperlipidemia   . Obese     History  Substance Use Topics  . Smoking status: Former Smoker    Quit date: 02/22/1975  . Smokeless tobacco: Not on file  . Alcohol Use: No    Review of Systems  Musculoskeletal: Positive for myalgias.  All other systems reviewed and are negative.   BP 138/92  Pulse 68  Temp(Src) 98.2 F (36.8 C) (Temporal)  Resp 18  Ht 5\' 5"  (1.651 m)  Wt 252 lb (114.306 kg)  BMI 41.93 kg/m2     Objective:   Physical Exam  Nursing note and vitals reviewed. Constitutional: He is oriented to person, place, and time. He appears well-developed and well-nourished.  HENT:  Head: Normocephalic and atraumatic.  Right Ear: External ear normal.  Left Ear: External ear normal.  Nose: Nose normal.  Mouth/Throat: Oropharynx is clear and moist.  Eyes: Conjunctivae and EOM are normal. Pupils are equal, round, and reactive to light.  Neck: Normal range of motion. Neck supple.  Cardiovascular: Normal rate, regular rhythm, normal heart sounds and intact distal pulses.   Pulmonary/Chest: Effort normal and breath sounds normal.  Abdominal: Soft.  Musculoskeletal: Normal range of motion.  + RT external elbow with tenderness, no obvious deformity, strength equal Bilaterally  Neurological: He is alert and oriented to person, place, and time. He has  normal reflexes.  Skin: Skin is warm and dry.  Psychiatric: He has a normal mood and affect. Judgment normal.          Assessment & Plan:  3 month F/U for Obesity, HTN, Cholesterol, Pre-Dm, D. Deficient. Check labs continue to improve diet, exercise, and wt loss. Rt elbow pain explained need for OTC brace and trial of Ibuprofen 800mg  TID with food and h2o x days, if no change pt w/c. Ck BP if >130/80 call for RX change  01/01/13 PATIENT REFUSES METFORMIN RX DESPITE RISKS WITH OBESITY

## 2012-12-31 NOTE — Progress Notes (Signed)
This encounter was created in error - please disregard.

## 2012-12-31 NOTE — Progress Notes (Deleted)
  Subjective:    Patient ID: John Andrade, male    DOB: 09-08-1955, 57 y.o.   MRN: [id number]  HPI Comments: 57 yo AAF presents with increasing episodes of CP over last week, starts mid chest and radiates down left arm and in to back. +FMH with Father with MI.  Hyperlipidemia Associated symptoms include chest pain.  Hypertension Associated symptoms include chest pain.  Gastrophageal Reflux He complains of chest pain.   MEDS Atorvastatin 40 Fluoxetine 20 X2 Metformin 500 mg Metoprolol 200 Xanax 0.25 Prempro ASA 81  PMH HTN Hyperlipidemia DM GERD Depression Anemia Colon CA  Review of Systems  Respiratory: Negative.   Cardiovascular: Positive for chest pain.       Objective:   Physical Exam  Nursing note and vitals reviewed. Constitutional: He appears well-developed.  Cardiovascular: Normal rate, regular rhythm, normal heart sounds and intact distal pulses.   Neurological: He is alert.  Skin: Skin is warm and dry.  Psychiatric: He has a normal mood and affect. His behavior is normal. Judgment and thought content normal.    EKG NSCSPT      Assessment & Plan:  Chest pain advised ER, refused transport via EMS. Advised against driving. Shene [place] Nurse at ED given verbal report.

## 2013-01-01 ENCOUNTER — Other Ambulatory Visit: Payer: Self-pay | Admitting: *Deleted

## 2013-01-01 ENCOUNTER — Encounter: Payer: Self-pay | Admitting: Internal Medicine

## 2013-01-01 DIAGNOSIS — E785 Hyperlipidemia, unspecified: Secondary | ICD-10-CM

## 2013-01-01 LAB — HEPATIC FUNCTION PANEL
ALT: 17 U/L (ref 0–53)
AST: 16 U/L (ref 0–37)
Albumin: 4.4 g/dL (ref 3.5–5.2)
Alkaline Phosphatase: 89 U/L (ref 39–117)
Bilirubin, Direct: 0.1 mg/dL (ref 0.0–0.3)
Indirect Bilirubin: 0.4 mg/dL (ref 0.0–0.9)
Total Bilirubin: 0.5 mg/dL (ref 0.3–1.2)
Total Protein: 7.5 g/dL (ref 6.0–8.3)

## 2013-01-01 LAB — LIPID PANEL
Cholesterol: 239 mg/dL — ABNORMAL HIGH (ref 0–200)
HDL: 43 mg/dL (ref >39)
LDL Cholesterol: 156 mg/dL — ABNORMAL HIGH (ref 0–99)
Total CHOL/HDL Ratio: 5.6 Ratio
Triglycerides: 198 mg/dL — ABNORMAL HIGH (ref <150)
VLDL: 40 mg/dL (ref 0–40)

## 2013-01-01 LAB — BASIC METABOLIC PANEL WITH GFR
BUN: 13 mg/dL (ref 6–23)
CO2: 24 mEq/L (ref 19–32)
Calcium: 10.1 mg/dL (ref 8.4–10.5)
Chloride: 103 mEq/L (ref 96–112)
Creat: 0.93 mg/dL (ref 0.50–1.35)
GFR, Est African American: >89 mL/min
GFR, Est Non African American: >89 mL/min
Glucose, Bld: 71 mg/dL (ref 70–99)
Potassium: 4.4 mEq/L (ref 3.5–5.3)
Sodium: 137 mEq/L (ref 135–145)

## 2013-01-01 LAB — INSULIN, FASTING: Insulin fasting, serum: 33 u[IU]/mL — ABNORMAL HIGH (ref 3–28)

## 2013-01-01 LAB — HEMOGLOBIN A1C
Hgb A1c MFr Bld: 5.8 % — ABNORMAL HIGH (ref <5.7)
Mean Plasma Glucose: 120 mg/dL — ABNORMAL HIGH (ref <117)

## 2013-01-01 LAB — PSA: PSA: 1.51 ng/mL (ref <=4.00)

## 2013-01-01 LAB — MAGNESIUM: Magnesium: 1.9 mg/dL (ref 1.5–2.5)

## 2013-01-01 LAB — VITAMIN D 25 HYDROXY (VIT D DEFICIENCY, FRACTURES): Vit D, 25-Hydroxy: 70 ng/mL (ref 30–89)

## 2013-01-01 MED ORDER — METFORMIN HCL 500 MG PO TABS: ORAL_TABLET | ORAL | Status: DC

## 2013-01-01 MED ORDER — EZETIMIBE 10 MG PO TABS: 10.0000 mg | ORAL_TABLET | Freq: Every day | ORAL | Status: DC

## 2013-01-01 NOTE — Addendum Note (Signed)
Addended by: Loree Fee R on: 01/01/2013 06:14 AM   Modules accepted: Orders

## 2013-01-20 ENCOUNTER — Other Ambulatory Visit: Payer: Self-pay | Admitting: Physician Assistant

## 2013-01-21 ENCOUNTER — Other Ambulatory Visit: Payer: Self-pay | Admitting: Internal Medicine

## 2013-01-21 MED ORDER — OMEPRAZOLE 40 MG PO CPDR: DELAYED_RELEASE_CAPSULE | ORAL | Status: DC

## 2013-02-21 HISTORY — PX: ELBOW LIGAMENT RECONSTRUCTION: SHX6359

## 2013-03-19 ENCOUNTER — Other Ambulatory Visit: Payer: Self-pay | Admitting: Physician Assistant

## 2013-04-24 ENCOUNTER — Other Ambulatory Visit: Payer: Self-pay | Admitting: Orthopedic Surgery

## 2013-04-24 DIAGNOSIS — S42401A Unspecified fracture of lower end of right humerus, initial encounter for closed fracture: Secondary | ICD-10-CM

## 2013-04-29 ENCOUNTER — Ambulatory Visit
Admission: RE | Admit: 2013-04-29 | Discharge: 2013-04-29 | Disposition: A | Discharge status: Home / Self Care | Payer: BC Managed Care – PPO | Source: Ambulatory Visit | Attending: Orthopedic Surgery | Admitting: Orthopedic Surgery

## 2013-04-29 DIAGNOSIS — S42401A Unspecified fracture of lower end of right humerus, initial encounter for closed fracture: Secondary | ICD-10-CM

## 2013-04-30 ENCOUNTER — Ambulatory Visit: Payer: Self-pay | Admitting: Emergency Medicine

## 2013-05-06 ENCOUNTER — Encounter: Payer: Self-pay | Admitting: Emergency Medicine

## 2013-05-06 ENCOUNTER — Ambulatory Visit (INDEPENDENT_AMBULATORY_CARE_PROVIDER_SITE_OTHER): Payer: BC Managed Care – PPO | Admitting: Emergency Medicine

## 2013-05-06 VITALS — BP 138/90 | HR 66 | Temp 98.2°F | Resp 18 | Ht 65.0 in | Wt 255.0 lb

## 2013-05-06 DIAGNOSIS — R7309 Other abnormal glucose: Secondary | ICD-10-CM

## 2013-05-06 DIAGNOSIS — I10 Essential (primary) hypertension: Secondary | ICD-10-CM

## 2013-05-06 DIAGNOSIS — E669 Obesity, unspecified: Secondary | ICD-10-CM

## 2013-05-06 DIAGNOSIS — E782 Mixed hyperlipidemia: Secondary | ICD-10-CM

## 2013-05-06 LAB — LIPID PANEL
Cholesterol: 212 mg/dL — ABNORMAL HIGH (ref 0–200)
HDL: 41 mg/dL (ref >39)
LDL Cholesterol: 137 mg/dL — ABNORMAL HIGH (ref 0–99)
Total CHOL/HDL Ratio: 5.2 Ratio
Triglycerides: 169 mg/dL — ABNORMAL HIGH (ref <150)
VLDL: 34 mg/dL (ref 0–40)

## 2013-05-06 LAB — HEPATIC FUNCTION PANEL
ALT: 29 U/L (ref 0–53)
AST: 15 U/L (ref 0–37)
Albumin: 4.3 g/dL (ref 3.5–5.2)
Alkaline Phosphatase: 83 U/L (ref 39–117)
Bilirubin, Direct: 0.1 mg/dL (ref 0.0–0.3)
Indirect Bilirubin: 0.5 mg/dL (ref 0.2–1.2)
Total Bilirubin: 0.6 mg/dL (ref 0.2–1.2)
Total Protein: 7.2 g/dL (ref 6.0–8.3)

## 2013-05-06 LAB — CBC WITH DIFFERENTIAL/PLATELET
Basophils Absolute: 0.1 10*3/uL (ref 0.0–0.1)
Basophils Relative: 1 % (ref 0–1)
Eosinophils Absolute: 0.2 10*3/uL (ref 0.0–0.7)
Eosinophils Relative: 3 % (ref 0–5)
HCT: 44.9 % (ref 39.0–52.0)
Hemoglobin: 15.2 g/dL (ref 13.0–17.0)
Lymphocytes Relative: 34 % (ref 12–46)
Lymphs Abs: 2.1 10*3/uL (ref 0.7–4.0)
MCH: 28.8 pg (ref 26.0–34.0)
MCHC: 33.9 g/dL (ref 30.0–36.0)
MCV: 85 fL (ref 78.0–100.0)
Monocytes Absolute: 0.4 10*3/uL (ref 0.1–1.0)
Monocytes Relative: 7 % (ref 3–12)
Neutro Abs: 3.4 10*3/uL (ref 1.7–7.7)
Neutrophils Relative %: 55 % (ref 43–77)
Platelets: 323 10*3/uL (ref 150–400)
RBC: 5.28 MIL/uL (ref 4.22–5.81)
RDW: 13.7 % (ref 11.5–15.5)
WBC: 6.2 10*3/uL (ref 4.0–10.5)

## 2013-05-06 LAB — HEMOGLOBIN A1C
Hgb A1c MFr Bld: 5.9 % — ABNORMAL HIGH (ref <5.7)
Mean Plasma Glucose: 123 mg/dL — ABNORMAL HIGH (ref <117)

## 2013-05-06 LAB — BASIC METABOLIC PANEL WITH GFR
BUN: 15 mg/dL (ref 6–23)
CO2: 28 mEq/L (ref 19–32)
Calcium: 9.9 mg/dL (ref 8.4–10.5)
Chloride: 104 mEq/L (ref 96–112)
Creat: 1.01 mg/dL (ref 0.50–1.35)
GFR, Est African American: >89 mL/min
GFR, Est Non African American: 83 mL/min
Glucose, Bld: 112 mg/dL — ABNORMAL HIGH (ref 70–99)
Potassium: 4.5 mEq/L (ref 3.5–5.3)
Sodium: 139 mEq/L (ref 135–145)

## 2013-05-06 MED ORDER — EZETIMIBE 10 MG PO TABS: 10.0000 mg | ORAL_TABLET | Freq: Every day | ORAL | Status: DC

## 2013-05-06 MED ORDER — MONTELUKAST SODIUM 10 MG PO TABS: 10.0000 mg | ORAL_TABLET | Freq: Every day | ORAL | Status: DC

## 2013-05-06 MED ORDER — METFORMIN HCL 500 MG PO TABS: 500.0000 mg | ORAL_TABLET | Freq: Two times a day (BID) | ORAL | Status: DC

## 2013-05-06 NOTE — Progress Notes (Signed)
Subjective:    Patient ID: John Andrade, male    DOB: 1955/06/14, 58 y.o.   MRN: [id number]  HPI Comments: 58 yo male presents for 3 month F/U for Obese, HTN, Cholesterol, Pre-Dm, D. Deficient. He is not exercising because of his back. He did not start Zetia AD and declined Metformin at last visit. He notes 10 # weight loss. He is decreasing calorie intake but has not started eating healthier. He is going to start isogenics this week for weight loss. He has chronic back pain and has been advised will need further surgery but wants to lose weight before considering.  LAST LABS CHOL         239   12/31/2012 HDL           43   12/31/2012 LDLCALC      156   12/31/2012 TRIG         198   12/31/2012 CHOLHDL      5.6   12/31/2012 ALT           17   12/31/2012 AST           16   12/31/2012 ALKPHOS       89   12/31/2012 BILITOT      0.5   12/31/2012 CREATININE     0.93   12/31/2012 BUN              13   12/31/2012 NA              137   12/31/2012 K               4.4   12/31/2012 CL              103   12/31/2012 CO2              24   12/31/2012 HGBA1C      5.8   12/31/2012 Insulin 33     Review of Systems  Musculoskeletal: Positive for back pain.  All other systems reviewed and are negative.   BP 138/90  Pulse 66  Temp(Src) 98.2 F (36.8 C) (Temporal)  Resp 18  Ht 5\' 5"  (1.651 m)  Wt 255 lb (115.667 kg)  BMI 42.43 kg/m2     Objective:   Physical Exam  Nursing note and vitals reviewed. Constitutional: He is oriented to person, place, and time. He appears well-developed and well-nourished.  obese  HENT:  Head: Normocephalic and atraumatic.  Right Ear: External ear normal.  Left Ear: External ear normal.  Nose: Nose normal.  Eyes: Conjunctivae and EOM are normal.  Neck: Normal range of motion. Neck supple. No JVD present. No thyromegaly present.  Cardiovascular: Normal rate, regular rhythm, normal heart sounds and intact distal pulses.   Pulmonary/Chest: Effort normal and  breath sounds normal.  Abdominal: Soft. Bowel sounds are normal. He exhibits no distension and no mass. There is no tenderness. There is no rebound and no guarding.  Musculoskeletal: Normal range of motion. He exhibits no edema and no tenderness.  Limp after sitting, mild discomfort with ROM  Lymphadenopathy:    He has no cervical adenopathy.  Neurological: He is alert and oriented to person, place, and time. He has normal reflexes. No cranial nerve deficit. Coordination normal.  Skin: Skin is warm and dry.  Psychiatric: He has a normal mood and affect. His behavior is normal. Judgment and thought content normal.  Assessment & Plan:  1.  3 month F/U for Obesity, HTN, Cholesterol, Pre-Dm, D. Deficient. Needs healthy diet, cardio QD and obtain healthy weight. Check Labs, Check BP if >130/80 call office  2. Chronic back pain- Advised Ortho f/u, water exercise

## 2013-05-06 NOTE — Patient Instructions (Signed)
   Bad carbs also include fruit juice, alcohol, and sweet tea. These are empty calories that do not signal to your brain that you are full.   Please remember the good carbs are still carbs which convert into sugar. So please measure them out no more than 1/2-1 cup of rice, oatmeal, pasta, and beans.  Veggies are however free foods! Pile them on.   I like lean protein at every meal such as chicken, turkey, pork chops, cottage cheese, etc. Just do not fry these meats and please center your meal around vegetable, the meats should be a side dish.   No all fruit is created equal. Please see the list below, the fruit at the bottom is higher in sugars than the fruit at the top   We want weight loss that will last so you should lose 1-2 pounds a week.  THAT IS IT! Please pick THREE things a month to change. Once it is a habit check off the item. Then pick another three items off the list to become habits.  If you are already doing a habit on the list GREAT!  Cross that item off! o Don't drink your calories. Ie, alcohol, soda, fruit juice, and sweet tea.  o Drink more water. Drink a glass when you feel hungry or before each meal.  o Eat breakfast - Complex carb and protein (likeDannon light and fit yogurt, oatmeal, fruit, eggs, turkey bacon). o Measure your cereal.  Eat no more than one cup a day. (ie Kashi) o Eat an apple a day. o Add a vegetable a day. o Try a new vegetable a month. o Use Pam! Stop using oil or butter to [place]. o Don't finish your plate or use smaller plates. o Share your dessert. o Eat sugar free Jello for dessert or frozen grapes. o Don't eat 2-3 hours before bed. o Switch to whole wheat bread, pasta, and brown rice. o Make healthier choices when you eat out. No fries! o Pick baked chicken, NOT fried. o Don't forget to SLOW DOWN when you eat. It is not going anywhere.  o Take the stairs. o Park far away in the parking lot o Lift soup cans (or weights) for 10 minutes while  watching TV. o Walk at work for 10 minutes during break. o Walk outside 1 time a week with your friend, kids, dog, or significant other. o Start a walking group at church. o Walk the mall as much as you can tolerate.  o Keep a food diary. o Weigh yourself daily. o Walk for 15 minutes 3 days per week. o [place] at home more often and eat out less.  If life happens and you go back to old habits, it is okay.  Just start over. You can do it!   If you experience chest pain, get short of breath, or tired during the exercise, please stop immediately and inform your doctor.   

## 2013-05-07 LAB — INSULIN, FASTING: Insulin fasting, serum: 30 u[IU]/mL — ABNORMAL HIGH (ref 3–28)

## 2013-05-09 ENCOUNTER — Other Ambulatory Visit: Payer: Self-pay | Admitting: *Deleted

## 2013-05-09 MED ORDER — EZETIMIBE 10 MG PO TABS: 10.0000 mg | ORAL_TABLET | Freq: Every day | ORAL | Status: DC

## 2013-06-15 ENCOUNTER — Other Ambulatory Visit: Payer: Self-pay | Admitting: Physician Assistant

## 2013-06-23 ENCOUNTER — Other Ambulatory Visit: Payer: Self-pay | Admitting: Internal Medicine

## 2013-06-24 ENCOUNTER — Encounter: Payer: Self-pay | Admitting: Physician Assistant

## 2013-06-24 ENCOUNTER — Ambulatory Visit (INDEPENDENT_AMBULATORY_CARE_PROVIDER_SITE_OTHER): Payer: BC Managed Care – PPO | Admitting: Physician Assistant

## 2013-06-24 VITALS — BP 132/88 | HR 60 | Temp 98.1°F | Resp 16 | Ht 65.0 in | Wt 253.0 lb

## 2013-06-24 DIAGNOSIS — L500 Allergic urticaria: Secondary | ICD-10-CM

## 2013-06-24 DIAGNOSIS — B354 Tinea corporis: Secondary | ICD-10-CM

## 2013-06-24 MED ORDER — CLOTRIMAZOLE-BETAMETHASONE 1-0.05 % EX CREA: 1.0000 "application " | TOPICAL_CREAM | Freq: Two times a day (BID) | CUTANEOUS | Status: DC

## 2013-06-24 NOTE — Patient Instructions (Addendum)
Go back to other detergent or a hypoallergenic detergent Stop metformin for now Use cream twice a day for 2 weeks Follow up in two weeks- call or OV- may try to restart metformin is the cream helps the rash.   Rash A rash is a change in the color or texture of your skin. There are many different types of rashes. You may have other problems that accompany your rash. CAUSES   Infections.  Allergic reactions. This can include allergies to pets or foods.  Certain medicines.  Exposure to certain chemicals, soaps, or cosmetics.  Heat.  Exposure to poisonous plants.  Tumors, both cancerous and noncancerous. SYMPTOMS   Redness.  Scaly skin.  Itchy skin.  Dry or cracked skin.  Bumps.  Blisters.  Pain. DIAGNOSIS  Your caregiver may do a physical exam to determine what type of rash you have. A skin sample (biopsy) may be taken and examined under a microscope. TREATMENT  Treatment depends on the type of rash you have. Your caregiver may prescribe certain medicines. For serious conditions, you may need to see a skin doctor (dermatologist). HOME CARE INSTRUCTIONS   Avoid the substance that caused your rash.  Do not scratch your rash. This can cause infection.  You may take cool baths to help stop itching.  Only take over-the-counter or prescription medicines as directed by your caregiver.  Keep all follow-up appointments as directed by your caregiver. SEEK IMMEDIATE MEDICAL CARE IF:  You have increasing pain, swelling, or redness.  You have a fever.  You have new or severe symptoms.  You have body aches, diarrhea, or vomiting.  Your rash is not better after 3 days. MAKE SURE YOU:  Understand these instructions.  Will watch your condition.  Will get help right away if you are not doing well or get worse. Document Released: 01/28/2002 Document Revised: 05/02/2011 Document Reviewed: 11/22/2010 [facility] Patient Information 2014 [place]ExitCare, [place].  We are starting  you on Metformin to prevent or treat diabetes. Metformin does not cause low blood sugars. In order to create energy your cells need insulin and sugar but sometime your cells do not accept the insulin and this can cause increased sugars and decreased energy. The Metformin helps your cells accept insulin and the sugar to give you more energy.   The two most common side effects are nausea and diarrhea, follow these rules to avoid it! You can take imodium per box instructions when starting metformin if needed.   Rules of metformin: 1) start out slow with only one pill daily. Our goal for you is 4 pills a day or 2000mg  total.  2) take with your largest meal. 3) Take with least amount of carbs.   Call if you have any problems.     Bad carbs also include fruit juice, alcohol, and sweet tea. These are empty calories that do not signal to your brain that you are full.   Please remember the good carbs are still carbs which convert into sugar. So please measure them out no more than 1/2-1 cup of rice, oatmeal, pasta, and beans.  Veggies are however free foods! Pile them on.   I like lean protein at every meal such as chicken, Mal[place]urkey, pork chops, cottage cheese, etc. Just do not fry these meats and please center your meal around vegetable, the meats should be a side dish.   No all fruit is created equal. Please see the list below, the fruit at the bottom is higher in sugars than the  fruit at the top

## 2013-06-24 NOTE — Progress Notes (Signed)
   Subjective:    Patient ID: John Andrade, male    DOB: [birth year], 58 y.o.   MRN: [id number]  Rash This is a new problem. The problem has been gradually improving (has cleared up on chest but it is on stomach, back, and legs. ) since onset. The affected locations include the abdomen, chest, torso and back. The rash is characterized by scaling and redness (not itchy). He was exposed to a new medication and a new detergent/soap (states it was a few days after starting metformin). Associated symptoms include diarrhea, a fever and vomiting (Saturday to Monday had a stomach virus, but that has improved- rash was 1-2 weeks after this). Pertinent negatives include no anorexia, congestion, cough, eye pain, facial edema, fatigue, joint pain, nail changes, rhinorrhea, shortness of breath or sore throat. Past treatments include anti-itch cream.    Review of Systems  Constitutional: Positive for fever. Negative for fatigue.  HENT: Negative for congestion, rhinorrhea and sore throat.   Eyes: Negative for pain.  Respiratory: Negative for cough and shortness of breath.   Gastrointestinal: Positive for vomiting (Saturday to Monday had a stomach virus, but that has improved- rash was 1-2 weeks after this) and diarrhea. Negative for anorexia.  Musculoskeletal: Negative for joint pain.  Skin: Positive for rash. Negative for nail changes.       Objective:   Physical Exam  Constitutional: He is oriented to person, place, and time. He appears well-developed and well-nourished.  HENT:  Head: Normocephalic and atraumatic.  Right Ear: External ear normal.  Left Ear: External ear normal.  Mouth/Throat: Oropharynx is clear and moist.  Eyes: Conjunctivae and EOM are normal. Pupils are equal, round, and reactive to light.  Neck: Normal range of motion. Neck supple.  Cardiovascular: Normal rate, regular rhythm and normal heart sounds.   Pulmonary/Chest: Effort normal and breath sounds normal.  Abdominal: Soft.  Bowel sounds are normal.  Musculoskeletal: Normal range of motion.  Neurological: He is alert and oriented to person, place, and time. No cranial nerve deficit.  Skin: Skin is warm and dry. Rash (Well circumscribed annular scaly erythematous patches on lower back, left leg, and abdomen. in clusters) noted.  Psychiatric: He has a normal mood and affect. His behavior is normal.       Assessment & Plan:  ? Urticaria vs Tinea corpus- switch back to normal soap, will try to avoid prednisone for the time being and will do topical treatment, clotrim/betazone, stop metformin for now  Follow up in 2 weeks.

## 2013-07-01 ENCOUNTER — Other Ambulatory Visit: Payer: Self-pay | Admitting: Physician Assistant

## 2013-07-01 ENCOUNTER — Other Ambulatory Visit: Payer: Self-pay | Admitting: Emergency Medicine

## 2013-07-06 ENCOUNTER — Other Ambulatory Visit: Payer: Self-pay | Admitting: Emergency Medicine

## 2013-07-08 ENCOUNTER — Encounter: Payer: Self-pay | Admitting: Physician Assistant

## 2013-07-08 ENCOUNTER — Ambulatory Visit (INDEPENDENT_AMBULATORY_CARE_PROVIDER_SITE_OTHER): Payer: BC Managed Care – PPO | Admitting: Physician Assistant

## 2013-07-08 VITALS — BP 140/88 | HR 72 | Temp 97.9°F | Resp 16 | Ht 65.0 in | Wt 253.0 lb

## 2013-07-08 DIAGNOSIS — L500 Allergic urticaria: Secondary | ICD-10-CM

## 2013-07-08 MED ORDER — FLUCONAZOLE 150 MG PO TABS: 150.0000 mg | ORAL_TABLET | Freq: Every day | ORAL | Status: DC

## 2013-07-08 MED ORDER — DEXAMETHASONE SODIUM PHOSPHATE 100 MG/10ML IJ SOLN
10.0000 mg | Freq: Once | INTRAMUSCULAR | Status: AC
  Administered 2013-07-08: 10 mg via INTRAMUSCULAR

## 2013-07-08 MED ORDER — PREDNISONE 20 MG PO TABS: ORAL_TABLET | ORAL | Status: DC

## 2013-07-08 NOTE — Patient Instructions (Signed)
Stop vitamin D, Zetia, Singulair, and the metformin for now Continue the Claritin or switch to zyrtec Prednisone injection today but I'm also going to send in a taper of the pills if it does not get better.  Going to send in diflucan.    Hives Hives are itchy, red, puffy (swollen) areas of the skin. Hives can change in size and location on your body. Hives can come and go for hours, days, or weeks. Hives do not spread from person to person (noncontagious). Scratching, exercise, and stress can make your hives worse. HOME CARE  Avoid things that cause your hives (triggers).  Take antihistamine medicines as told by your doctor. Do not drive while taking an antihistamine.  Take any other medicines for itching as told by your doctor.  Wear loose-fitting clothing.  Keep all doctor visits as told. GET HELP RIGHT AWAY IF:   You have a fever.  Your tongue or lips are puffy.  You have trouble breathing or swallowing.  You feel tightness in the throat or chest.  You have belly (abdominal) pain.  You have lasting or severe itching that is not helped by medicine.  You have painful or puffy joints. These problems may be the first sign of a life-threatening allergic reaction. Call your local emergency services (911 in [place] MAKE SURE YOU:   Understand these instructions.  Will watch your condition.  Will get help right away if you are not doing well or get worse. Document Released: 11/17/2007 Document Revised: 08/09/2011 Document Reviewed: 05/03/2011 [facility] Patient Information 2014 [place]ExitCare, [place]LLC.

## 2013-07-08 NOTE — Progress Notes (Signed)
   Subjective:    Patient ID: John Andrade, male    DOB: December 15, 1955, 58 y.o.   MRN: [id number]  HPI Patient is here for 2 weeks follow up. The problem has been gradually improving (has cleared up on chest but it is on stomach, back, and legs. ) since onset but is still on his posterior legs the worse..  The rash is characterized by scaling and redness (not itchy). He has stopped his metformin but continues to have the rash, we did not do prednisone last time due to his DM.  Pertinent negatives include no anorexia, congestion, cough, eye pain, facial edema, fatigue, joint pain, nail changes, rhinorrhea, shortness of breath or sore throat.    Review of Systems  Constitutional: Negative for fatigue.  HENT: Negative for congestion, rhinorrhea and sore throat.   Eyes: Negative for pain.  Respiratory: Negative for cough and shortness of breath.   Gastrointestinal: Negative for anorexia.  Musculoskeletal: Negative for joint pain.  Skin: Positive for rash. Negative for nail changes.       Objective:   Physical Exam  Constitutional: He is oriented to person, place, and time. He appears well-developed and well-nourished.  HENT:  Head: Normocephalic and atraumatic.  Right Ear: External ear normal.  Left Ear: External ear normal.  Mouth/Throat: Oropharynx is clear and moist.  Eyes: Conjunctivae and EOM are normal. Pupils are equal, round, and reactive to light.  Neck: Normal range of motion. Neck supple.  Cardiovascular: Normal rate, regular rhythm and normal heart sounds.   Pulmonary/Chest: Effort normal and breath sounds normal.  Abdominal: Soft. Bowel sounds are normal.  Musculoskeletal: Normal range of motion.  Neurological: He is alert and oriented to person, place, and time. No cranial nerve deficit.  Skin: Skin is warm and dry. Rash (Well circumscribed annular scaly erythematous patches on lower back, left leg, and abdomen. in clusters) noted.  Psychiatric: He has a normal mood and affect.  His behavior is normal.      Assessment & Plan:  1. Allergic urticaria Stop metformin, zetia, and singulair, get on zyrtec - dexamethasone (DECADRON) injection 10 mg; Inject 1 mL (10 mg total) into the muscle once.

## 2013-08-13 ENCOUNTER — Encounter: Payer: Self-pay | Admitting: Physician Assistant

## 2013-08-13 ENCOUNTER — Other Ambulatory Visit: Payer: Self-pay | Admitting: Emergency Medicine

## 2013-08-14 ENCOUNTER — Ambulatory Visit (INDEPENDENT_AMBULATORY_CARE_PROVIDER_SITE_OTHER): Payer: BC Managed Care – PPO | Admitting: Emergency Medicine

## 2013-08-14 ENCOUNTER — Encounter: Payer: Self-pay | Admitting: Emergency Medicine

## 2013-08-14 VITALS — BP 136/82 | HR 66 | Temp 98.2°F | Resp 18 | Ht 65.0 in | Wt 259.0 lb

## 2013-08-14 DIAGNOSIS — M5430 Sciatica, unspecified side: Secondary | ICD-10-CM

## 2013-08-14 DIAGNOSIS — M5441 Lumbago with sciatica, right side: Secondary | ICD-10-CM

## 2013-08-14 DIAGNOSIS — I10 Essential (primary) hypertension: Secondary | ICD-10-CM

## 2013-08-14 DIAGNOSIS — Z1212 Encounter for screening for malignant neoplasm of rectum: Secondary | ICD-10-CM

## 2013-08-14 DIAGNOSIS — Z0000 Encounter for general adult medical examination without abnormal findings: Secondary | ICD-10-CM

## 2013-08-14 DIAGNOSIS — E782 Mixed hyperlipidemia: Secondary | ICD-10-CM

## 2013-08-14 DIAGNOSIS — L259 Unspecified contact dermatitis, unspecified cause: Secondary | ICD-10-CM

## 2013-08-14 DIAGNOSIS — L309 Dermatitis, unspecified: Secondary | ICD-10-CM

## 2013-08-14 DIAGNOSIS — Z111 Encounter for screening for respiratory tuberculosis: Secondary | ICD-10-CM

## 2013-08-14 DIAGNOSIS — M5442 Lumbago with sciatica, left side: Secondary | ICD-10-CM

## 2013-08-14 DIAGNOSIS — R7309 Other abnormal glucose: Secondary | ICD-10-CM

## 2013-08-14 LAB — HEMOGLOBIN A1C
Hgb A1c MFr Bld: 6.3 % — ABNORMAL HIGH (ref <5.7)
Mean Plasma Glucose: 134 mg/dL — ABNORMAL HIGH (ref <117)

## 2013-08-14 LAB — CBC WITH DIFFERENTIAL/PLATELET
Basophils Absolute: 0.1 10*3/uL (ref 0.0–0.1)
Basophils Relative: 1 % (ref 0–1)
Eosinophils Absolute: 0.1 10*3/uL (ref 0.0–0.7)
Eosinophils Relative: 2 % (ref 0–5)
HCT: 43.6 % (ref 39.0–52.0)
Hemoglobin: 15.4 g/dL (ref 13.0–17.0)
Lymphocytes Relative: 33 % (ref 12–46)
Lymphs Abs: 1.7 10*3/uL (ref 0.7–4.0)
MCH: 29.7 pg (ref 26.0–34.0)
MCHC: 35.3 g/dL (ref 30.0–36.0)
MCV: 84.2 fL (ref 78.0–100.0)
Monocytes Absolute: 0.5 10*3/uL (ref 0.1–1.0)
Monocytes Relative: 9 % (ref 3–12)
Neutro Abs: 2.9 10*3/uL (ref 1.7–7.7)
Neutrophils Relative %: 55 % (ref 43–77)
Platelets: 330 10*3/uL (ref 150–400)
RBC: 5.18 MIL/uL (ref 4.22–5.81)
RDW: 13.7 % (ref 11.5–15.5)
WBC: 5.3 10*3/uL (ref 4.0–10.5)

## 2013-08-14 MED ORDER — FLUCONAZOLE 150 MG PO TABS: 150.0000 mg | ORAL_TABLET | ORAL | Status: DC

## 2013-08-14 MED ORDER — OMEPRAZOLE 40 MG PO CPDR: 40.0000 mg | DELAYED_RELEASE_CAPSULE | Freq: Two times a day (BID) | ORAL | Status: DC

## 2013-08-14 MED ORDER — AMLODIPINE BESYLATE 10 MG PO TABS: ORAL_TABLET | ORAL | Status: DC

## 2013-08-14 MED ORDER — LISINOPRIL 20 MG PO TABS: ORAL_TABLET | ORAL | Status: DC

## 2013-08-14 NOTE — Patient Instructions (Addendum)
Food Choices for Gastroesophageal Reflux Disease When you have gastroesophageal reflux disease (GERD), the foods you eat and your eating habits are very important. Choosing the right foods can help ease your discomfort.  WHAT GUIDELINES DO I NEED TO FOLLOW?   Choose fruits, vegetables, whole grains, and low-fat dairy products.   Choose low-fat meat, fish, and poultry.  Limit fats such as oils, salad dressings, butter, nuts, and avocado.   Keep a food diary. This helps you identify foods that cause symptoms.   Avoid foods that cause symptoms. These may be different for everyone.   Eat small meals often instead of 3 large meals a day.   Eat your meals slowly, in a place where you are relaxed.   Limit fried foods.   [place] foods using methods other than frying.   Avoid drinking alcohol.   Avoid drinking large amounts of liquids with your meals.   Avoid bending over or lying down until 2-3 hours after eating.  WHAT FOODS ARE NOT RECOMMENDED?  These are some foods and drinks that may make your symptoms worse: Vegetables Tomatoes. Tomato juice. Tomato and spaghetti sauce. Chili peppers. Onion and garlic. Horseradish. Fruits Oranges, grapefruit, and lemon (fruit and juice). Meats High-fat meats, fish, and poultry. This includes hot dogs, ribs, ham, sausage, salami, and bacon. Dairy Whole milk and chocolate milk. Sour cream. Cream. Butter. Ice cream. Cream cheese.  Drinks Coffee and tea. Bubbly (carbonated) drinks or energy drinks. Condiments Hot sauce. Barbecue sauce.  Sweets/Desserts Chocolate and cocoa. Donuts. Peppermint and spearmint. Fats and Oils High-fat foods. This includes JamaicaFrench fries and potato chips. Other Vinegar. Strong spices. This includes black pepper, white pepper, red pepper, cayenne, curry powder, cloves, ginger, and chili powder. The items listed above may not be a complete list of foods and drinks to avoid. Contact your dietitian for more  information. Document Released: 08/09/2011 Document Revised: 02/12/2013 Document Reviewed: 12/12/2012 [facility]Exit[facility] Patient Information 2015 [place]ExitCare, [place]LLC. This information is not intended to replace advice given to you by your health care provider. Make sure you discuss any questions you have with your health care provider. Tuberculin Skin Test The PPD skin test is a method used to help with the diagnosis of a disease called tuberculosis (TB). HOW THE TEST IS DONE  The test site (usually the forearm) is cleansed. The PPD extract is then injected under the top layer of skin, causing a blister to form on the skin. The reaction will take 48 - 72 hours to develop. You must return to your health care provider within that time to have the area checked. This will determine whether you have had a significant reaction to the PPD test. A reaction is measured in millimeters of hard swelling (induration) at the site. PREPARATION FOR TEST  There is no special preparation for this test. People with a skin rash or other skin irritations on their arms may need to have the test performed at a different spot on the body. Tell your health care provider if you have ever had a positive PPD skin test. If so, you should not have a repeat PPD test. Tell your doctor if you have a medical condition or if you take certain drugs, such as steroids, that can affect your immune system. These situations may lead to inaccurate test results. NORMAL FINDINGS A negative reaction (no induration) or a level of hard swelling that falls below a certain cutoff may mean that a person has not been infected with the bacteria that cause  TB. There are different cutoffs for children, people with HIV, and other risk groups. Unfortunately, this is not a perfect test, and up to 20% of people infected with tuberculosis may not have a reaction on the PPD skin test. In addition, certain conditions that affect the immune system (cancer, recent chemotherapy,  late-stage AIDS) may cause a false-negative test result.  The reaction will take 48 - 72 hours to develop. You must return to your health care provider within that time to have the area checked. Follow your caregiver's instructions as to where and when to report for this to be done. Ranges for normal findings may vary among different laboratories and hospitals. You should always check with your doctor after having lab work or other tests done to discuss the meaning of your test results and whether your values are considered within normal limits. WHAT ABNORMAL RESULTS MEAN  The results of the test depend on the size of the skin reaction and on the person being tested.  A small reaction (5 mm of hard swelling at the site) is considered to be positive in people who have HIV, who are taking steroid therapy, or who have been in close contact with a person who has active tuberculosis. Larger reactions (greater than or equal to 10 mm) are considered positive in people with diabetes or kidney failure, and in health care workers, among others. In people with no known risks for tuberculosis, a positive reaction requires 15 mm or more of hard swelling at the site. RISKS AND COMPLICATIONS There is a very small risk of severe redness and swelling of the arm in people who have had a previous positive PPD test and who have the test again. There also have been a few rare cases of this reaction in people who have not been tested before. CONSIDERATIONS  A positive skin test does not necessarily mean that a person has active tuberculosis. More tests will be done to check whether active disease is present. Many people who were born outside the [place] may have had a vaccine called "BCG," which can lead to a false-positive test result. MEANING OF TEST  Your caregiver will go over the test results with you and discuss the importance and meaning of your results, as well as treatment options and the need for additional  tests if necessary. OBTAINING THE TEST RESULTS It is your responsibility to obtain your test results. Ask the lab or department performing the test when and how you will get your results. Document Released: 11/17/2004 Document Revised: 05/02/2011 Document Reviewed: 01/20/2008 [facility] Patient Information 2015 [place], [place]. This information is not intended to replace advice given to you by your health care provider. Make sure you discuss any questions you have with your health care provider.  Yeast Infection of the Skin Some yeast on the skin is normal, but sometimes it causes an infection. If you have a yeast infection, it shows up as white or light brown patches on brown skin. You can see it better in the summer on tan skin. It causes light-colored holes in your suntan. It can happen on any area of the body. This cannot be passed from person to person. HOME CARE  Scrub your skin daily with a dandruff shampoo. Your rash may take a couple weeks to get well.  Do not scratch or itch the rash. GET HELP RIGHT AWAY IF:   You get another infection from scratching. The skin may get warm, red, and may ooze fluid.  The  infection does not seem to be getting better. MAKE SURE YOU:  Understand these instructions.  Will watch your condition.  Will get help right away if you are not doing well or get worse. Document Released: 01/21/2008 Document Revised: 05/02/2011 Document Reviewed: 01/21/2008 [facility] Patient Information 2015 [place] SpringsExitCare, [place]LLC. This information is not intended to replace advice given to you by your health care provider. Make sure you discuss any questions you have with your health care provider.

## 2013-08-14 NOTE — Progress Notes (Signed)
Subjective:    Patient ID: John Andrade, male    DOB: 11-11-1955, 58 y.o.   MRN: [id number]  HPI Comments: 58 yo Obese WM CPE and presents for 3 month F/U for HTN, Cholesterol, Pre-Dm, D. Deficient. He has been out of Lisinopril x 1 week. He is not checking his BP on a routine basis. He has not started exercising due to busy schedule. He is not eating any differently than previous visit. He has chosen in the past not to take statins but cannot remember the reason. He does not occasionally chest discomfort but notes it feels like heartburn.   He was treated for rash and advised to d/c MF/ ZETIA/ Singulair by Marchelle FolksAmanda. He had to have several shots/ prednisone/ creams/ Diflucan and initial improvement but comes back. He notes does not usually itch but it is red. He has had rash since March.  He has been having more pain in low back and needs to get rescheduled with Dr Channing MuttersOy for re-eval with surgery history.   He has had increased hoarseness over last couple of days.    WBC             6.2   05/06/2013 HGB            15.2   05/06/2013 HCT            44.9   05/06/2013 PLT             323   05/06/2013 GLUCOSE         112   05/06/2013 CHOL            212   05/06/2013 TRIG            169   05/06/2013 HDL              41   05/06/2013 LDLCALC         137   05/06/2013 ALT              29   05/06/2013 AST              15   05/06/2013 NA              139   05/06/2013 K               4.5   05/06/2013 CL              104   05/06/2013 CREATININE     1.01   05/06/2013 BUN              15   05/06/2013 CO2              28   05/06/2013 PSA            1.51   12/31/2012 HGBA1C          5.9   05/06/2013   Hypertension      Medication List       This list is accurate as of: 08/14/13 11:59 PM.  Always use your most recent med list.               amLODipine 10 MG tablet  Commonly known as:  NORVASC  TAKE 1 TABLET DAILY     fluconazole 150 MG tablet  Commonly known as:  DIFLUCAN  Take 1 tablet (150 mg total) by  mouth once a week.     lisinopril 20 MG tablet  Commonly known as:  PRINIVIL,ZESTRIL  TAKE 1 TABLET DAILY     omeprazole 40 MG capsule  Commonly known as:  PRILOSEC  Take 1 capsule (40 mg total) by mouth 2 (two) times daily.       Allergies  Allergen Reactions  . Phentermine Other (See Comments)    constipation  . Pravastatin   . Zocor [Simvastatin]    Past Medical History  Diagnosis Date  . DDD (degenerative disc disease), cervical   . DJD (degenerative joint disease)   . Wears glasses   . Hypertension   . Multiple allergies   . GERD (gastroesophageal reflux disease)   . Hyperlipidemia   . Obese    Past Surgical History  Procedure Laterality Date  . Back surgery  2002    lumb lam-fusion  . Ankle fracture surgery  1995    right  . Colonoscopy    . Knee arthroscopy Left 04/24/2012    Procedure: ARTHROSCOPY KNEE, PARTIAL MEDIAL MENISECTOMY AND CHONDROPLASTY;  Surgeon: Velna OchsPeter G Dalldorf, MD;  Location: [facility];  Service: Orthopedics;  Laterality: Left;  . Elbow ligament reconstruction Right 2015    [place]   History  Substance Use Topics  . Smoking status: Former Smoker    Quit date: 02/22/1975  . Smokeless tobacco: Not on file  . Alcohol Use: No   Family History  Problem Relation Age of Onset  . Hypertension Mother   . Dementia Mother   . Cancer Father   . Hypertension Father   . Heart disease Father   . Hyperlipidemia Father   . Diabetes Sister   . Cancer Maternal Grandmother     lung    MAINTENANCE: Colonoscopy:2010 [id number]:2014 WNL Dentist:rarely  CATH: 2006 EF 58%  IMMUNIZATIONS: Tdap:2010 Pneumovax: Zostavax: Influenza:2014  Patient Care Team: Lucky CowboyWilliam McKeown, MD as PCP - General (Internal Medicine) Emeline Generalanjan Roy, MD as Referring Physician (Neurosurgery) Lesleigh NoeHenry W Smith III, MD as Consulting Physician (Cardiology) Griffith CitronJeffrey R Medoff, MD as Consulting Physician (Gastroenterology) [facility] Joesphine BareGreeson, (Dentist)  Review of  Systems  HENT: Positive for voice change.   Skin: Positive for rash.  All other systems reviewed and are negative.  BP 136/82  Pulse 66  Temp(Src) 98.2 F (36.8 C) (Temporal)  Resp 18  Ht 5\' 5"  (1.651 m)  Wt 259 lb (117.482 kg)  BMI 43.10 kg/m2     Objective:   Physical Exam  Nursing note and vitals reviewed. Constitutional: He is oriented to person, place, and time. He appears well-developed and well-nourished.  obese  HENT:  Head: Normocephalic and atraumatic.  Right Ear: External ear normal.  Left Ear: External ear normal.  Nose: Nose normal.  Mouth/Throat: Oropharynx is clear and moist.  + hoarseness  Eyes: Conjunctivae and EOM are normal. Pupils are equal, round, and reactive to light. Right eye exhibits no discharge. Left eye exhibits no discharge. No scleral icterus.  Neck: Normal range of motion. Neck supple. No JVD present. No tracheal deviation present. No thyromegaly present.  Cardiovascular: Normal rate, regular rhythm, normal heart sounds and intact distal pulses.   Pulmonary/Chest: Effort normal and breath sounds normal.  Abdominal: Soft. Bowel sounds are normal. He exhibits no distension and no mass. There is no tenderness. There is no rebound and no guarding.  Musculoskeletal: Normal range of motion. He exhibits no edema and no tenderness.  Lymphadenopathy:    He has no cervical adenopathy.  Neurological: He is alert and oriented to person, place, and time. He has normal reflexes. No cranial nerve deficit.  He exhibits normal muscle tone. Coordination normal.  Skin: Skin is warm and dry. No rash noted. No erythema. No pallor.  Left ankle/ both buttocks and posterior thighs with multiple irreg circular, raised patches  Psychiatric: He has a normal mood and affect. His behavior is normal. Judgment and thought content normal.      AORTA SCAN WNL EKG NSCSPT     Assessment & Plan:  1. CPE- Update screening labs/ History/ Immunizations/ Testing as needed.  Advised healthy diet, QD exercise, increase H20 and continue RX/ Vitamins AD.  2. 3 month F/U for Obesity, NON-Compliant HTN, Cholesterol, Pre-Dm, D. Deficient. Needs healthy diet, cardio QD and obtain healthy weight. Check Labs, Check BP if >130/80 call office. IF Labs abnormal may need repeat Cardiac eval  3. Back pain- f/u Dr Channing Mutters  4. ? Hoarseness/ GERD vs Chest pain- Increase Omeprazole to BID and call with results, hygiene explained. w/c if SX increase or ER.  5. Irreg Rash- ref DERM, Diflucan 150 mg 1 qwk #4

## 2013-08-15 LAB — BASIC METABOLIC PANEL WITH GFR
BUN: 13 mg/dL (ref 6–23)
CO2: 22 mEq/L (ref 19–32)
Calcium: 9.5 mg/dL (ref 8.4–10.5)
Chloride: 101 mEq/L (ref 96–112)
Creat: 0.89 mg/dL (ref 0.50–1.35)
GFR, Est African American: >89 mL/min
GFR, Est Non African American: >89 mL/min
Glucose, Bld: 117 mg/dL — ABNORMAL HIGH (ref 70–99)
Potassium: 4.1 mEq/L (ref 3.5–5.3)
Sodium: 140 mEq/L (ref 135–145)

## 2013-08-15 LAB — TSH: TSH: 1.444 u[IU]/mL (ref 0.350–4.500)

## 2013-08-15 LAB — ~~LOC~~ ALLERGY PANEL
Allergen, Cedar tree, t12: <0.1 kU/L
Allergen, Comm Silver Birch, t9: <0.1 kU/L
Allergen, D pternoyssinus,d7: <0.1 kU/L
Allergen, Mulberry, t76: <0.1 kU/L
Alternaria Alternata: <0.1 kU/L
Aspergillus fumigatus, m3: <0.1 kU/L
Bahia Grass: <0.1 kU/L
Bermuda Grass: <0.1 kU/L
Box Elder IgE: <0.1 kU/L
Cat Dander: <0.1 kU/L
Cladosporium Herbarum: <0.1 kU/L
Cockroach: <0.1 kU/L
Common Ragweed: <0.1 kU/L
D. farinae: <0.1 kU/L
Dog Dander: <0.1 kU/L
Elm IgE: <0.1 kU/L
Johnson Grass: <0.1 kU/L
Mucor Racemosus: <0.1 kU/L
Mugwort: <0.1 kU/L
Nettle: <0.1 kU/L
Oak: <0.1 kU/L
Pecan/Hickory Tree IgE: <0.1 kU/L
Penicillium Notatum: <0.1 kU/L
Plantain: <0.1 kU/L
Rough Pigweed  IgE: <0.1 kU/L
Sheep Sorrel IgE: <0.1 kU/L
Stemphylium Botryosum: <0.1 kU/L
Sweet Gum: <0.1 kU/L
Timothy Grass: <0.1 kU/L

## 2013-08-15 LAB — TESTOSTERONE: Testosterone: 197 ng/dL — ABNORMAL LOW (ref 300–890)

## 2013-08-15 LAB — HEPATIC FUNCTION PANEL
ALT: 25 U/L (ref 0–53)
AST: 18 U/L (ref 0–37)
Albumin: 4.5 g/dL (ref 3.5–5.2)
Alkaline Phosphatase: 84 U/L (ref 39–117)
Bilirubin, Direct: 0.1 mg/dL (ref 0.0–0.3)
Indirect Bilirubin: 0.4 mg/dL (ref 0.2–1.2)
Total Bilirubin: 0.5 mg/dL (ref 0.2–1.2)
Total Protein: 7.2 g/dL (ref 6.0–8.3)

## 2013-08-15 LAB — URINALYSIS, MICROSCOPIC ONLY
Bacteria, UA: NONE SEEN
Casts: NONE SEEN
Crystals: NONE SEEN
Squamous Epithelial / LPF: NONE SEEN

## 2013-08-15 LAB — URINALYSIS, ROUTINE W REFLEX MICROSCOPIC
Bilirubin Urine: NEGATIVE
Glucose, UA: NEGATIVE mg/dL
Hgb urine dipstick: NEGATIVE
Ketones, ur: NEGATIVE mg/dL
Nitrite: NEGATIVE
Protein, ur: 30 mg/dL — AB
Specific Gravity, Urine: 1.02 (ref 1.005–1.030)
Urobilinogen, UA: 0.2 mg/dL (ref 0.0–1.0)
pH: 5.5 (ref 5.0–8.0)

## 2013-08-15 LAB — ALLERGEN FOOD PROFILE SPECIFIC IGE
Apple: <0.1 kU/L
Chicken IgE: <0.1 kU/L
Corn: <0.1 kU/L
Egg White IgE: <0.1 kU/L
Fish Cod: <0.1 kU/L
IgE (Immunoglobulin E), Serum: 3.4 IU/mL (ref 0.0–180.0)
Milk IgE: <0.1 kU/L
Orange: <0.1 kU/L
Peanut IgE: <0.1 kU/L
Shrimp IgE: <0.1 kU/L
Soybean IgE: <0.1 kU/L
Tomato IgE: <0.1 kU/L
Tuna IgE: <0.1 kU/L
Wheat IgE: <0.1 kU/L

## 2013-08-15 LAB — LIPID PANEL
Cholesterol: 264 mg/dL — ABNORMAL HIGH (ref 0–200)
HDL: 52 mg/dL (ref >39)
LDL Cholesterol: 181 mg/dL — ABNORMAL HIGH (ref 0–99)
Total CHOL/HDL Ratio: 5.1 Ratio
Triglycerides: 157 mg/dL — ABNORMAL HIGH (ref <150)
VLDL: 31 mg/dL (ref 0–40)

## 2013-08-15 LAB — MICROALBUMIN / CREATININE URINE RATIO
Creatinine, Urine: 182.6 mg/dL
Microalb Creat Ratio: 74 mg/g — ABNORMAL HIGH (ref 0.0–30.0)
Microalb, Ur: 13.51 mg/dL — ABNORMAL HIGH (ref 0.00–1.89)

## 2013-08-15 LAB — INSULIN, FASTING: Insulin fasting, serum: 61 u[IU]/mL — ABNORMAL HIGH (ref 3–28)

## 2013-08-15 LAB — PSA: PSA: 2.29 ng/mL (ref <=4.00)

## 2013-08-15 LAB — VITAMIN D 25 HYDROXY (VIT D DEFICIENCY, FRACTURES): Vit D, 25-Hydroxy: 63 ng/mL (ref 30–89)

## 2013-08-15 LAB — MAGNESIUM: Magnesium: 1.7 mg/dL (ref 1.5–2.5)

## 2013-08-16 ENCOUNTER — Encounter: Payer: Self-pay | Admitting: Emergency Medicine

## 2013-08-16 LAB — TB SKIN TEST
Induration: 0 mm
TB Skin Test: NEGATIVE

## 2013-08-18 ENCOUNTER — Encounter: Payer: Self-pay | Admitting: Emergency Medicine

## 2013-08-19 ENCOUNTER — Other Ambulatory Visit: Payer: Self-pay | Admitting: Emergency Medicine

## 2013-08-19 DIAGNOSIS — E782 Mixed hyperlipidemia: Secondary | ICD-10-CM

## 2013-08-19 MED ORDER — PITAVASTATIN CALCIUM 4 MG PO TABS: 4.0000 mg | ORAL_TABLET | Freq: Every day | ORAL | Status: DC

## 2013-08-22 ENCOUNTER — Other Ambulatory Visit (INDEPENDENT_AMBULATORY_CARE_PROVIDER_SITE_OTHER): Payer: BC Managed Care – PPO

## 2013-08-22 DIAGNOSIS — Z1212 Encounter for screening for malignant neoplasm of rectum: Secondary | ICD-10-CM

## 2013-08-22 DIAGNOSIS — Z0000 Encounter for general adult medical examination without abnormal findings: Secondary | ICD-10-CM

## 2013-08-22 LAB — POC HEMOCCULT BLD/STL (HOME/3-CARD/SCREEN)
Card #2 Fecal Occult Blod, POC: NEGATIVE
Card #3 Fecal Occult Blood, POC: NEGATIVE
Fecal Occult Blood, POC: NEGATIVE

## 2013-08-30 ENCOUNTER — Telehealth: Payer: Self-pay | Admitting: *Deleted

## 2013-08-30 NOTE — Telephone Encounter (Signed)
Pt called & checked Status of referral to Dr Channing Muttersoy they say they havent  Gotten anything asking can you please check this & even call them they say they will take the appt,  Over the phone.  [phone]    please let pt know  [phone]

## 2013-09-04 NOTE — Telephone Encounter (Signed)
Dr. Channing Muttersoy will contact patient to schedule. Referral was in process. lmom to pt

## 2013-09-11 ENCOUNTER — Other Ambulatory Visit: Payer: Self-pay | Admitting: Emergency Medicine

## 2013-09-13 ENCOUNTER — Other Ambulatory Visit: Payer: Self-pay | Admitting: Physician Assistant

## 2013-10-01 ENCOUNTER — Other Ambulatory Visit: Payer: Self-pay | Admitting: Physician Assistant

## 2013-11-06 ENCOUNTER — Other Ambulatory Visit: Payer: Self-pay | Admitting: Neurosurgery

## 2013-11-06 DIAGNOSIS — M47816 Spondylosis without myelopathy or radiculopathy, lumbar region: Secondary | ICD-10-CM

## 2013-11-11 ENCOUNTER — Other Ambulatory Visit: Payer: Self-pay | Admitting: Emergency Medicine

## 2013-11-13 ENCOUNTER — Other Ambulatory Visit: Payer: Self-pay | Admitting: Physician Assistant

## 2013-11-25 ENCOUNTER — Ambulatory Visit
Admission: RE | Admit: 2013-11-25 | Discharge: 2013-11-25 | Disposition: A | Discharge status: Home / Self Care | Payer: BC Managed Care – PPO | Source: Ambulatory Visit | Attending: Neurosurgery | Admitting: Neurosurgery

## 2013-11-25 ENCOUNTER — Ambulatory Visit: Payer: Self-pay | Admitting: Physician Assistant

## 2013-11-25 DIAGNOSIS — M47816 Spondylosis without myelopathy or radiculopathy, lumbar region: Secondary | ICD-10-CM

## 2013-12-23 ENCOUNTER — Other Ambulatory Visit: Payer: Self-pay | Admitting: Neurosurgery

## 2013-12-23 DIAGNOSIS — M48061 Spinal stenosis, lumbar region without neurogenic claudication: Secondary | ICD-10-CM

## 2013-12-30 ENCOUNTER — Ambulatory Visit
Admission: RE | Admit: 2013-12-30 | Discharge: 2013-12-30 | Disposition: A | Discharge status: Home / Self Care | Payer: BC Managed Care – PPO | Source: Ambulatory Visit | Attending: Neurosurgery | Admitting: Neurosurgery

## 2013-12-30 DIAGNOSIS — M48061 Spinal stenosis, lumbar region without neurogenic claudication: Secondary | ICD-10-CM

## 2013-12-30 MED ORDER — IOHEXOL 300 MG/ML  SOLN
1.0000 mL | Freq: Once | INTRAMUSCULAR | Status: AC | PRN
  Administered 2013-12-30: 1 mL via EPIDURAL

## 2013-12-30 MED ORDER — TRIAMCINOLONE ACETONIDE 40 MG/ML IJ SUSP (RADIOLOGY)
60.0000 mg | Freq: Once | INTRAMUSCULAR | Status: AC
  Administered 2013-12-30: 60 mg via EPIDURAL

## 2013-12-30 NOTE — Discharge Instructions (Signed)

## 2014-01-22 ENCOUNTER — Other Ambulatory Visit: Payer: Self-pay | Admitting: Neurosurgery

## 2014-01-22 DIAGNOSIS — M5126 Other intervertebral disc displacement, lumbar region: Secondary | ICD-10-CM

## 2014-01-23 ENCOUNTER — Ambulatory Visit
Admission: RE | Admit: 2014-01-23 | Discharge: 2014-01-23 | Disposition: A | Discharge status: Home / Self Care | Payer: BC Managed Care – PPO | Source: Ambulatory Visit | Attending: Neurosurgery | Admitting: Neurosurgery

## 2014-01-23 DIAGNOSIS — M5126 Other intervertebral disc displacement, lumbar region: Secondary | ICD-10-CM

## 2014-01-23 MED ORDER — IOHEXOL 180 MG/ML  SOLN
1.0000 mL | Freq: Once | INTRAMUSCULAR | Status: AC | PRN
  Administered 2014-01-23: 1 mL via EPIDURAL

## 2014-01-23 MED ORDER — METHYLPREDNISOLONE ACETATE 40 MG/ML INJ SUSP (RADIOLOG
120.0000 mg | Freq: Once | INTRAMUSCULAR | Status: AC
  Administered 2014-01-23: 120 mg via EPIDURAL

## 2014-02-18 ENCOUNTER — Other Ambulatory Visit: Payer: Self-pay | Admitting: Internal Medicine

## 2014-02-18 ENCOUNTER — Other Ambulatory Visit: Payer: Self-pay | Admitting: Physician Assistant

## 2014-03-24 HISTORY — PX: BACK SURGERY: SHX140

## 2014-04-25 ENCOUNTER — Other Ambulatory Visit: Payer: Self-pay | Admitting: Physician Assistant

## 2014-07-25 ENCOUNTER — Other Ambulatory Visit: Payer: Self-pay | Admitting: Physician Assistant

## 2014-07-30 ENCOUNTER — Other Ambulatory Visit: Payer: Self-pay | Admitting: Neurosurgery

## 2014-07-30 DIAGNOSIS — M5126 Other intervertebral disc displacement, lumbar region: Secondary | ICD-10-CM

## 2014-08-14 ENCOUNTER — Ambulatory Visit
Admission: RE | Admit: 2014-08-14 | Discharge: 2014-08-14 | Disposition: A | Discharge status: Home / Self Care | Payer: BLUE CROSS/BLUE SHIELD | Source: Ambulatory Visit | Attending: Neurosurgery | Admitting: Neurosurgery

## 2014-08-14 DIAGNOSIS — M5126 Other intervertebral disc displacement, lumbar region: Secondary | ICD-10-CM

## 2014-08-14 MED ORDER — GADOBENATE DIMEGLUMINE 529 MG/ML IV SOLN
20.0000 mL | Freq: Once | INTRAVENOUS | Status: AC | PRN
  Administered 2014-08-14: 20 mL via INTRAVENOUS

## 2014-08-18 ENCOUNTER — Encounter: Payer: Self-pay | Admitting: Emergency Medicine

## 2014-10-10 ENCOUNTER — Other Ambulatory Visit: Payer: Self-pay | Admitting: Orthopaedic Surgery

## 2014-10-10 DIAGNOSIS — M25562 Pain in left knee: Secondary | ICD-10-CM

## 2014-10-14 ENCOUNTER — Other Ambulatory Visit: Payer: Self-pay | Admitting: Physician Assistant

## 2014-10-17 ENCOUNTER — Ambulatory Visit
Admission: RE | Admit: 2014-10-17 | Discharge: 2014-10-17 | Disposition: A | Discharge status: Home / Self Care | Payer: BLUE CROSS/BLUE SHIELD | Source: Ambulatory Visit | Attending: Orthopaedic Surgery | Admitting: Orthopaedic Surgery

## 2014-10-17 DIAGNOSIS — M25562 Pain in left knee: Secondary | ICD-10-CM

## 2014-10-22 ENCOUNTER — Encounter (HOSPITAL_BASED_OUTPATIENT_CLINIC_OR_DEPARTMENT_OTHER): Payer: Self-pay | Admitting: *Deleted

## 2014-10-22 ENCOUNTER — Encounter (HOSPITAL_BASED_OUTPATIENT_CLINIC_OR_DEPARTMENT_OTHER)
Admission: RE | Admit: 2014-10-22 | Discharge: 2014-10-22 | Disposition: A | Discharge status: Home / Self Care | Payer: BLUE CROSS/BLUE SHIELD | Source: Ambulatory Visit | Attending: Orthopaedic Surgery | Admitting: Orthopaedic Surgery

## 2014-10-22 ENCOUNTER — Other Ambulatory Visit: Payer: Self-pay | Admitting: Orthopaedic Surgery

## 2014-10-22 DIAGNOSIS — K219 Gastro-esophageal reflux disease without esophagitis: Secondary | ICD-10-CM | POA: Diagnosis not present

## 2014-10-22 DIAGNOSIS — I10 Essential (primary) hypertension: Secondary | ICD-10-CM | POA: Diagnosis not present

## 2014-10-22 DIAGNOSIS — Z6841 Body Mass Index (BMI) 40.0 and over, adult: Secondary | ICD-10-CM | POA: Diagnosis not present

## 2014-10-22 DIAGNOSIS — Z01818 Encounter for other preprocedural examination: Secondary | ICD-10-CM | POA: Diagnosis not present

## 2014-10-22 DIAGNOSIS — M25462 Effusion, left knee: Secondary | ICD-10-CM | POA: Diagnosis not present

## 2014-10-22 DIAGNOSIS — M9420 Chondromalacia, unspecified site: Secondary | ICD-10-CM | POA: Diagnosis not present

## 2014-10-22 DIAGNOSIS — M23222 Derangement of posterior horn of medial meniscus due to old tear or injury, left knee: Secondary | ICD-10-CM | POA: Diagnosis not present

## 2014-10-22 DIAGNOSIS — E785 Hyperlipidemia, unspecified: Secondary | ICD-10-CM | POA: Diagnosis not present

## 2014-10-22 DIAGNOSIS — M1990 Unspecified osteoarthritis, unspecified site: Secondary | ICD-10-CM | POA: Diagnosis not present

## 2014-10-22 DIAGNOSIS — S83242D Other tear of medial meniscus, current injury, left knee, subsequent encounter: Secondary | ICD-10-CM | POA: Diagnosis not present

## 2014-10-22 DIAGNOSIS — Z87891 Personal history of nicotine dependence: Secondary | ICD-10-CM | POA: Diagnosis not present

## 2014-10-22 DIAGNOSIS — Z888 Allergy status to other drugs, medicaments and biological substances status: Secondary | ICD-10-CM | POA: Diagnosis not present

## 2014-10-22 DIAGNOSIS — M5136 Other intervertebral disc degeneration, lumbar region: Secondary | ICD-10-CM | POA: Diagnosis not present

## 2014-10-22 DIAGNOSIS — M238X2 Other internal derangements of left knee: Secondary | ICD-10-CM | POA: Diagnosis not present

## 2014-10-22 DIAGNOSIS — M2242 Chondromalacia patellae, left knee: Secondary | ICD-10-CM | POA: Diagnosis not present

## 2014-10-22 DIAGNOSIS — M23204 Derangement of unspecified medial meniscus due to old tear or injury, left knee: Secondary | ICD-10-CM | POA: Diagnosis present

## 2014-10-22 LAB — BASIC METABOLIC PANEL
Anion gap: 8 (ref 5–15)
BUN: 13 mg/dL (ref 6–20)
CO2: 24 mmol/L (ref 22–32)
Calcium: 9.8 mg/dL (ref 8.9–10.3)
Chloride: 107 mmol/L (ref 101–111)
Creatinine, Ser: 0.98 mg/dL (ref 0.61–1.24)
GFR calc Af Amer: >60 mL/min (ref >60)
GFR calc non Af Amer: >60 mL/min (ref >60)
Glucose, Bld: 122 mg/dL — ABNORMAL HIGH (ref 65–99)
Potassium: 4.5 mmol/L (ref 3.5–5.1)
Sodium: 139 mmol/L (ref 135–145)

## 2014-10-24 NOTE — H&P (Signed)
John Andrade is an 59 y.o. male.   Chief Complaint: Left knee pain HPI: John Andrade is here again with continued left knee pain.  This is the knee on which we performed an arthroscopy 2 years back.  He did great until experiencing recurrent discomfort over the last several months.  It began after his recent back surgery through Dr. Carloyn Manner.  He cannot recall any specific traumatic event.  He cannot kneel or twist.  Trouble getting in and out a car.  He cannot sleep at night and his wife is here to attest to that fact.  She says he screams out in pain frequently.  He is using a cane for ambulation which is unusual for him.  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  He says the pain is intermittent and severe and getting worse.  He has been through an MRI scan since last time he was here.  We did give him an injection a couple of weeks ago which helped for maybe an hour.   MRI:  I reviewed an MRI scan films and report of a study done at [facility] on 10/17/14.  This shows evidence of his previous partial medial meniscectomy.  The radiologist has not read any new tears but looking at several of the sagittal cuts it looks as though there is a true meniscal tear.  The lateral meniscus looks normal.  He has minimal if any degenerative change.  Past Medical History  Diagnosis Date  . Wears glasses   . Hypertension   . Multiple allergies   . GERD (gastroesophageal reflux disease)   . Hyperlipidemia   . Obese   . DDD lumbar   . DJD (degenerative joint disease)     Past Surgical History  Procedure Laterality Date  . Ankle fracture surgery  1995    right  . Colonoscopy    . Knee arthroscopy Left 04/24/2012    Procedure: ARTHROSCOPY KNEE, PARTIAL MEDIAL MENISECTOMY AND CHONDROPLASTY;  Surgeon: Hessie Dibble, MD;  Location: [facility];  Service: Orthopedics;  Laterality: Left;  . Elbow  ligament reconstruction Right 2015    [facility]  . Back surgery  2002    lumb lam-fusion  . Back surgery  03-2014    Dr Carloyn Manner in [place]    Family History  Problem Relation Age of Onset  . Hypertension Mother   . Dementia Mother   . Cancer Father   . Hypertension Father   . Heart disease Father   . Hyperlipidemia Father   . Diabetes Sister   . Cancer Maternal Grandmother     lung   Social History:  reports that he quit smoking about 39 years ago. He does not have any smokeless tobacco history on file. He reports that he does not drink alcohol or use illicit drugs.  Allergies:  Allergies  Allergen Reactions  . Statins Other (See Comments)    Flu-like symptoms  . Phentermine Other (See Comments)    constipation    No prescriptions prior to admission    Results for orders placed or performed during the hospital encounter of 10/28/14 (from the past 48 hour(s))  Basic metabolic panel     Status: Abnormal   Collection Time: 10/22/14 10:40 AM  Result Value Ref Range   Sodium 139 135 - 145 mmol/L   Potassium 4.5 3.5 - 5.1  mmol/L   Chloride 107 101 - 111 mmol/L   CO2 24 22 - 32 mmol/L   Glucose, Bld 122 (H) 65 - 99 mg/dL   BUN 13 6 - 20 mg/dL   Creatinine, Ser 0.98 0.61 - 1.24 mg/dL   Calcium 9.8 8.9 - 10.3 mg/dL   GFR calc non Af Amer >60 >60 mL/min   GFR calc Af Amer >60 >60 mL/min    Comment: (NOTE) The eGFR has been calculated using the CKD EPI equation. This calculation has not been validated in all clinical situations. eGFR's persistently <60 mL/min signify possible Chronic Kidney Disease.    Anion gap 8 5 - 15   No results found.  Review of Systems  Musculoskeletal: Positive for joint pain.       Left knee  All other systems reviewed and are negative.   Height $Remov'5\' 5"'ehnXxK$  (1.651 m), weight 117.028 kg (258 lb). Physical Exam  Constitutional: He is oriented to person, place, and time. He appears well-developed and well-nourished.  HENT:  Head: Normocephalic and  atraumatic.  Eyes: Conjunctivae are normal. Pupils are equal, round, and reactive to light.  Neck: Normal range of motion.  Cardiovascular: Normal rate and regular rhythm.   Respiratory: Effort normal.  GI: Soft.  Musculoskeletal:  Left knee has trace effusion.  His motion is from 0-100 at which point he has some terrible pain.  He has exquisite medial joint line pain.  McMurray's test causes pain in the medial direction.  There is some mild crepitation towards full extension.  Ligaments are stable.   Hip motion is full and painfree and SLR is negative on both sides.  There is no palpable LAD behind either knee.  Sensation and motor function are intact on both sides and there are palpable pulses on both sides.    Neurological: He is alert and oriented to person, place, and time.  Skin: Skin is warm and dry.  Psychiatric: He has a normal mood and affect. His behavior is normal. Judgment and thought content normal.     Assessment/Plan Assessment: Left knee probable torn medial meniscus with arthroscopy 2014 injected 10/10/14  Plan: John Andrade has some terrible recurrent pain in his right knee.  He is relegated to using a cane.  This persists despite the use of a brace and an injection and Mobic.  His wife is here to attest to the fact that he is screaming out in pain during the day and at night.  Despite the fact that the radiologist is reading no new meniscal tears it looks to me on several cuts as though he does have a tear.  I do feel a knee arthroscopy is warranted.  They have a vacation coming up in about a month and would like this taken care of as soon as possible. I reviewed risks of anesthesia and infection as well as potential for DVT related to a knee arthroscopy.  I've stressed the importance of some postoperative physical therapy to optimize results and we will try to set up an appointment.  Two to four  weeks for recovery would be typical but that is a little variable.    Amea Mcphail, CAROLOS FECHER 10/24/2014, 10:02 AM

## 2014-10-28 ENCOUNTER — Ambulatory Visit (HOSPITAL_BASED_OUTPATIENT_CLINIC_OR_DEPARTMENT_OTHER): Payer: BLUE CROSS/BLUE SHIELD | Admitting: Anesthesiology

## 2014-10-28 ENCOUNTER — Encounter (HOSPITAL_BASED_OUTPATIENT_CLINIC_OR_DEPARTMENT_OTHER)
Admission: RE | Disposition: A | Discharge status: Home / Self Care | Payer: Self-pay | Source: Ambulatory Visit | Attending: Orthopaedic Surgery

## 2014-10-28 ENCOUNTER — Encounter (HOSPITAL_BASED_OUTPATIENT_CLINIC_OR_DEPARTMENT_OTHER): Payer: Self-pay | Admitting: *Deleted

## 2014-10-28 ENCOUNTER — Ambulatory Visit (HOSPITAL_BASED_OUTPATIENT_CLINIC_OR_DEPARTMENT_OTHER)
Admission: RE | Admit: 2014-10-28 | Discharge: 2014-10-28 | Disposition: A | Discharge status: Home / Self Care | Payer: BLUE CROSS/BLUE SHIELD | Source: Ambulatory Visit | Attending: Orthopaedic Surgery | Admitting: Orthopaedic Surgery

## 2014-10-28 DIAGNOSIS — M238X2 Other internal derangements of left knee: Secondary | ICD-10-CM | POA: Insufficient documentation

## 2014-10-28 DIAGNOSIS — M2242 Chondromalacia patellae, left knee: Secondary | ICD-10-CM | POA: Insufficient documentation

## 2014-10-28 DIAGNOSIS — M25462 Effusion, left knee: Secondary | ICD-10-CM | POA: Insufficient documentation

## 2014-10-28 DIAGNOSIS — E785 Hyperlipidemia, unspecified: Secondary | ICD-10-CM | POA: Insufficient documentation

## 2014-10-28 DIAGNOSIS — M23222 Derangement of posterior horn of medial meniscus due to old tear or injury, left knee: Secondary | ICD-10-CM | POA: Diagnosis not present

## 2014-10-28 DIAGNOSIS — Z6841 Body Mass Index (BMI) 40.0 and over, adult: Secondary | ICD-10-CM | POA: Insufficient documentation

## 2014-10-28 DIAGNOSIS — Z87891 Personal history of nicotine dependence: Secondary | ICD-10-CM | POA: Insufficient documentation

## 2014-10-28 DIAGNOSIS — K219 Gastro-esophageal reflux disease without esophagitis: Secondary | ICD-10-CM | POA: Insufficient documentation

## 2014-10-28 DIAGNOSIS — M5136 Other intervertebral disc degeneration, lumbar region: Secondary | ICD-10-CM | POA: Insufficient documentation

## 2014-10-28 DIAGNOSIS — I10 Essential (primary) hypertension: Secondary | ICD-10-CM | POA: Insufficient documentation

## 2014-10-28 DIAGNOSIS — M1990 Unspecified osteoarthritis, unspecified site: Secondary | ICD-10-CM | POA: Insufficient documentation

## 2014-10-28 DIAGNOSIS — Z888 Allergy status to other drugs, medicaments and biological substances status: Secondary | ICD-10-CM | POA: Insufficient documentation

## 2014-10-28 HISTORY — PX: CHONDROPLASTY: SHX5177

## 2014-10-28 HISTORY — PX: KNEE ARTHROSCOPY WITH MEDIAL MENISECTOMY: SHX5651

## 2014-10-28 LAB — POCT HEMOGLOBIN-HEMACUE: Hemoglobin: 14 g/dL (ref 13.0–17.0)

## 2014-10-28 SURGERY — ARTHROSCOPY, KNEE, WITH MEDIAL MENISCECTOMY
Anesthesia: General | Site: Knee | Laterality: Left

## 2014-10-28 MED ORDER — BUPIVACAINE-EPINEPHRINE 0.5% -1:200000 IJ SOLN
INTRAMUSCULAR | Status: DC | PRN
  Administered 2014-10-28: 20 mL

## 2014-10-28 MED ORDER — MORPHINE SULFATE (PF) 4 MG/ML IV SOLN
INTRAVENOUS | Status: AC
  Filled 2014-10-28: qty 1

## 2014-10-28 MED ORDER — FENTANYL CITRATE (PF) 100 MCG/2ML IJ SOLN
INTRAMUSCULAR | Status: AC
  Filled 2014-10-28: qty 2

## 2014-10-28 MED ORDER — DEXTROSE 5 % IV SOLN
3.0000 g | INTRAVENOUS | Status: AC
  Administered 2014-10-28: 3 g via INTRAVENOUS

## 2014-10-28 MED ORDER — CHLORHEXIDINE GLUCONATE 4 % EX LIQD: 60.0000 mL | Freq: Once | CUTANEOUS | Status: DC

## 2014-10-28 MED ORDER — OXYCODONE HCL 5 MG/5ML PO SOLN: 5.0000 mg | Freq: Once | ORAL | Status: DC | PRN

## 2014-10-28 MED ORDER — LIDOCAINE HCL (CARDIAC) 20 MG/ML IV SOLN
INTRAVENOUS | Status: AC
Start: 2014-10-28 — End: 2014-10-28
  Filled 2014-10-28: qty 5

## 2014-10-28 MED ORDER — DEXAMETHASONE SODIUM PHOSPHATE 4 MG/ML IJ SOLN
INTRAMUSCULAR | Status: DC | PRN
  Administered 2014-10-28: 10 mg via INTRAVENOUS

## 2014-10-28 MED ORDER — METHYLPREDNISOLONE ACETATE 40 MG/ML IJ SUSP
INTRAMUSCULAR | Status: AC
  Filled 2014-10-28: qty 1

## 2014-10-28 MED ORDER — FENTANYL CITRATE (PF) 100 MCG/2ML IJ SOLN
25.0000 ug | INTRAMUSCULAR | Status: DC | PRN
  Administered 2014-10-28: 50 ug via INTRAVENOUS

## 2014-10-28 MED ORDER — CEFAZOLIN SODIUM 1-5 GM-% IV SOLN
INTRAVENOUS | Status: AC
  Filled 2014-10-28: qty 50

## 2014-10-28 MED ORDER — FENTANYL CITRATE (PF) 100 MCG/2ML IJ SOLN
50.0000 ug | INTRAMUSCULAR | Status: DC | PRN
  Administered 2014-10-28: 100 ug via INTRAVENOUS

## 2014-10-28 MED ORDER — ACETAMINOPHEN 500 MG PO TABS
ORAL_TABLET | ORAL | Status: AC
  Filled 2014-10-28: qty 2

## 2014-10-28 MED ORDER — SCOPOLAMINE 1 MG/3DAYS TD PT72
1.0000 | MEDICATED_PATCH | Freq: Once | TRANSDERMAL | Status: DC | PRN
Start: 2014-10-28 — End: 2014-10-28

## 2014-10-28 MED ORDER — METHYLPREDNISOLONE ACETATE 80 MG/ML IJ SUSP
INTRAMUSCULAR | Status: AC
  Filled 2014-10-28: qty 1

## 2014-10-28 MED ORDER — MORPHINE SULFATE (PF) 4 MG/ML IV SOLN
INTRAVENOUS | Status: DC | PRN
  Administered 2014-10-28: 4 mg

## 2014-10-28 MED ORDER — LIDOCAINE HCL (CARDIAC) 20 MG/ML IV SOLN
INTRAVENOUS | Status: DC | PRN
  Administered 2014-10-28: 100 mg via INTRAVENOUS

## 2014-10-28 MED ORDER — OXYCODONE HCL 5 MG PO TABS: 5.0000 mg | ORAL_TABLET | Freq: Once | ORAL | Status: DC | PRN

## 2014-10-28 MED ORDER — LACTATED RINGERS IV SOLN
INTRAVENOUS | Status: DC
  Administered 2014-10-28: 09:00:00 via INTRAVENOUS

## 2014-10-28 MED ORDER — ONDANSETRON HCL 4 MG/2ML IJ SOLN
INTRAMUSCULAR | Status: DC | PRN
  Administered 2014-10-28: 4 mg via INTRAVENOUS

## 2014-10-28 MED ORDER — KETOROLAC TROMETHAMINE 30 MG/ML IJ SOLN
INTRAMUSCULAR | Status: DC | PRN
  Administered 2014-10-28: 30 mg via INTRAVENOUS

## 2014-10-28 MED ORDER — MIDAZOLAM HCL 2 MG/2ML IJ SOLN
INTRAMUSCULAR | Status: AC
  Filled 2014-10-28: qty 4

## 2014-10-28 MED ORDER — CEFAZOLIN SODIUM-DEXTROSE 2-3 GM-% IV SOLR
INTRAVENOUS | Status: AC
  Filled 2014-10-28: qty 50

## 2014-10-28 MED ORDER — FENTANYL CITRATE (PF) 100 MCG/2ML IJ SOLN
INTRAMUSCULAR | Status: AC
  Filled 2014-10-28: qty 4

## 2014-10-28 MED ORDER — PROMETHAZINE HCL 25 MG/ML IJ SOLN: 6.2500 mg | INTRAMUSCULAR | Status: DC | PRN

## 2014-10-28 MED ORDER — BUPIVACAINE-EPINEPHRINE (PF) 0.5% -1:200000 IJ SOLN
INTRAMUSCULAR | Status: AC
  Filled 2014-10-28: qty 30

## 2014-10-28 MED ORDER — LACTATED RINGERS IV SOLN
INTRAVENOUS | Status: DC
  Administered 2014-10-28 (×2): via INTRAVENOUS

## 2014-10-28 MED ORDER — PROPOFOL 10 MG/ML IV BOLUS
INTRAVENOUS | Status: DC | PRN
  Administered 2014-10-28: 250 mg via INTRAVENOUS

## 2014-10-28 MED ORDER — SODIUM CHLORIDE 0.9 % IR SOLN
Status: DC | PRN
  Administered 2014-10-28: 2000 mL

## 2014-10-28 MED ORDER — KETOROLAC TROMETHAMINE 30 MG/ML IJ SOLN
INTRAMUSCULAR | Status: AC
  Filled 2014-10-28: qty 1

## 2014-10-28 MED ORDER — ACETAMINOPHEN 500 MG PO TABS
1000.0000 mg | ORAL_TABLET | Freq: Once | ORAL | Status: AC
  Administered 2014-10-28: 1000 mg via ORAL

## 2014-10-28 MED ORDER — SUCCINYLCHOLINE CHLORIDE 20 MG/ML IJ SOLN
INTRAMUSCULAR | Status: AC
  Filled 2014-10-28: qty 1

## 2014-10-28 MED ORDER — GLYCOPYRROLATE 0.2 MG/ML IJ SOLN: 0.2000 mg | Freq: Once | INTRAMUSCULAR | Status: DC | PRN

## 2014-10-28 MED ORDER — MIDAZOLAM HCL 2 MG/2ML IJ SOLN
1.0000 mg | INTRAMUSCULAR | Status: DC | PRN
  Administered 2014-10-28: 2 mg via INTRAVENOUS

## 2014-10-28 SURGICAL SUPPLY — 39 items
BANDAGE ELASTIC 6 VELCRO ST LF (GAUZE/BANDAGES/DRESSINGS) ×3 IMPLANT
BLADE CUDA 5.5 (BLADE) IMPLANT
BLADE CUTTER GATOR 3.5 (BLADE) ×3 IMPLANT
BLADE GREAT WHITE 4.2 (BLADE) IMPLANT
BLADE GREAT WHITE 4.2MM (BLADE)
BNDG GAUZE ELAST 4 BULKY (GAUZE/BANDAGES/DRESSINGS) ×3 IMPLANT
DRAPE ARTHROSCOPY W/POUCH 114 (DRAPES) ×3 IMPLANT
DRAPE U-SHAPE 47X51 STRL (DRAPES) ×3 IMPLANT
DRSG EMULSION OIL 3X3 NADH (GAUZE/BANDAGES/DRESSINGS) ×3 IMPLANT
DURAPREP 26ML APPLICATOR (WOUND CARE) ×3 IMPLANT
ELECT MENISCUS 165MM 90D (ELECTRODE) IMPLANT
ELECT REM PT RETURN 9FT ADLT (ELECTROSURGICAL)
ELECTRODE REM PT RTRN 9FT ADLT (ELECTROSURGICAL) IMPLANT
GAUZE SPONGE 4X4 12PLY STRL (GAUZE/BANDAGES/DRESSINGS) ×3 IMPLANT
GLOVE BIO SURGEON STRL SZ8 (GLOVE) ×6 IMPLANT
GLOVE BIOGEL PI IND STRL 7.0 (GLOVE) ×2 IMPLANT
GLOVE BIOGEL PI IND STRL 8 (GLOVE) ×2 IMPLANT
GLOVE BIOGEL PI INDICATOR 7.0 (GLOVE) ×4
GLOVE BIOGEL PI INDICATOR 8 (GLOVE) ×4
GLOVE ECLIPSE 6.5 STRL STRAW (GLOVE) ×3 IMPLANT
GOWN STRL REUS W/ TWL LRG LVL3 (GOWN DISPOSABLE) ×1 IMPLANT
GOWN STRL REUS W/ TWL XL LVL3 (GOWN DISPOSABLE) ×2 IMPLANT
GOWN STRL REUS W/TWL LRG LVL3 (GOWN DISPOSABLE) ×2
GOWN STRL REUS W/TWL XL LVL3 (GOWN DISPOSABLE) ×4
IV NS IRRIG 3000ML ARTHROMATIC (IV SOLUTION) ×3 IMPLANT
KNEE WRAP E Z 3 GEL PACK (MISCELLANEOUS) ×3 IMPLANT
MANIFOLD NEPTUNE II (INSTRUMENTS) ×3 IMPLANT
PACK ARTHROSCOPY DSU (CUSTOM PROCEDURE TRAY) ×3 IMPLANT
PACK BASIN DAY SURGERY FS (CUSTOM PROCEDURE TRAY) ×3 IMPLANT
PENCIL BUTTON HOLSTER BLD 10FT (ELECTRODE) IMPLANT
SET ARTHROSCOPY TUBING (MISCELLANEOUS) ×2
SET ARTHROSCOPY TUBING LN (MISCELLANEOUS) ×1 IMPLANT
SHEET MEDIUM DRAPE 40X70 STRL (DRAPES) ×3 IMPLANT
SYR 3ML 18GX1 1/2 (SYRINGE) IMPLANT
TOWEL OR 17X24 6PK STRL BLUE (TOWEL DISPOSABLE) ×3 IMPLANT
TOWEL OR NON WOVEN STRL DISP B (DISPOSABLE) ×3 IMPLANT
WAND 30 DEG SABER W/CORD (SURGICAL WAND) IMPLANT
WAND STAR VAC 90 (SURGICAL WAND) IMPLANT
WATER STERILE IRR 1000ML POUR (IV SOLUTION) ×3 IMPLANT

## 2014-10-28 NOTE — Discharge Instructions (Signed)

## 2014-10-28 NOTE — Brief Op Note (Signed)
John Andrade [id number] 10/28/2014   PRE-OP DIAGNOSIS: left TMM and CP  POST-OP DIAGNOSIS: same  PROCEDURE: left knee scope  ANESTHESIA: general  Butch Otterson G   Dictation #:  [id number]

## 2014-10-28 NOTE — Transfer of Care (Signed)
Immediate Anesthesia Transfer of Care Note  Patient: John Andrade  Procedure(s) Performed: Procedure(s): LEFT ARTHROSCOPY KNEE, PARTIAL MEDIAL MENISECTOMY, CHONDROPLASTY (Left) CHONDROPLASTY (Left)  Patient Location: PACU  Anesthesia Type:General  Level of Consciousness: sedated  Airway & Oxygen Therapy: Patient Spontanous Breathing and Patient connected to face mask oxygen  Post-op Assessment: Report given to RN and Post -op Vital signs reviewed and stable  Post vital signs: Reviewed and stable  Last Vitals:  Filed Vitals:   10/28/14 0910  BP: 113/69  Pulse: 66  Temp: 36.8 C  Resp: 20    Complications: No apparent anesthesia complications

## 2014-10-28 NOTE — Anesthesia Procedure Notes (Signed)
Procedure Name: LMA Insertion Performed by: Rhoderick Farrel, [place] Pre-anesthesia Checklist: Patient identified, Emergency Drugs available, Suction available and Patient being monitored Patient Re-evaluated:Patient Re-evaluated prior to inductionOxygen Delivery Method: Circle System Utilized Preoxygenation: Pre-oxygenation with 100% oxygen Intubation Type: IV induction Ventilation: Mask ventilation without difficulty LMA: LMA inserted LMA Size: 4.0 Number of attempts: 1 Airway Equipment and Method: Bite block Placement Confirmation: positive ETCO2 Tube secured with: Tape Dental Injury: Teeth and Oropharynx as per pre-operative assessment      

## 2014-10-28 NOTE — Interval H&P Note (Signed)
OK for surgery PD 

## 2014-10-28 NOTE — Anesthesia Preprocedure Evaluation (Signed)
Anesthesia Evaluation  Patient identified by MRN, date of birth, ID band Patient awake    Reviewed: Allergy & Precautions, H&P , NPO status , Patient's Chart, lab work & pertinent test results  History of Anesthesia Complications Negative for: history of anesthetic complications  Airway Mallampati: II  TM Distance: >3 FB Neck ROM: Full    Dental no notable dental hx. (+) Teeth Intact, Dental Advisory Given   Pulmonary former smoker,  breath sounds clear to auscultation  Pulmonary exam normal       Cardiovascular Exercise Tolerance: Good hypertension, Pt. on medications - angina- Past MI Normal cardiovascular examRhythm:Regular Rate:Normal     Neuro/Psych negative neurological ROS  negative psych ROS   GI/Hepatic Neg liver ROS, GERD-  Medicated and Controlled,  Endo/Other  Morbid obesity  Renal/GU negative Renal ROS     Musculoskeletal  (+) Arthritis -, Osteoarthritis,    Abdominal   Peds  Hematology negative hematology ROS (+)   Anesthesia Other Findings   Reproductive/Obstetrics                             Anesthesia Physical  Anesthesia Plan  ASA: III  Anesthesia Plan: General   Post-op Pain Management:    Induction: Intravenous  Airway Management Planned: LMA  Additional Equipment:   Intra-op Plan:   Post-operative Plan: Extubation in OR  Informed Consent: I have reviewed the patients History and Physical, chart, labs and discussed the procedure including the risks, benefits and alternatives for the proposed anesthesia with the patient or authorized representative who has indicated his/her understanding and acceptance.   Dental advisory given  Plan Discussed with: CRNA and Surgeon  Anesthesia Plan Comments: (Risks/benefits of general anesthesia discussed with patient including risk of damage to teeth, lips, gum, and tongue, nausea/vomiting, allergic reactions to  medications, and the possibility of heart attack, stroke and death.  All patient questions answered.  Patient wishes to proceed.)        Anesthesia Quick Evaluation

## 2014-10-29 ENCOUNTER — Encounter (HOSPITAL_BASED_OUTPATIENT_CLINIC_OR_DEPARTMENT_OTHER): Payer: Self-pay | Admitting: Orthopaedic Surgery

## 2014-10-29 NOTE — Anesthesia Postprocedure Evaluation (Signed)
  Anesthesia Post-op Note  Patient: John Andrade  Procedure(s) Performed: Procedure(s) (LRB): LEFT ARTHROSCOPY KNEE, PARTIAL MEDIAL MENISECTOMY, CHONDROPLASTY (Left) CHONDROPLASTY (Left)  Patient Location: PACU  Anesthesia Type: General  Level of Consciousness: awake and alert   Airway and Oxygen Therapy: Patient Spontanous Breathing  Post-op Pain: mild  Post-op Assessment: Post-op Vital signs reviewed, Patient's Cardiovascular Status Stable, Respiratory Function Stable, Patent Airway and No signs of Nausea or vomiting  Last Vitals:  Filed Vitals:   10/28/14 1210  BP: 150/76  Pulse: 75  Temp: 36.7 C  Resp: 20    Post-op Vital Signs: stable   Complications: No apparent anesthesia complications

## 2014-10-29 NOTE — Op Note (Signed)
NAME:  MCDANIEL, OHMS NO.:  [id number]  MEDICAL RECORD NO.:  [id number]  LOCATION:                               FACILITY:  [facility]  PHYSICIAN:  Lubertha Basque. Treshaun Carrico, M.D.DATE OF BIRTH:  1955/12/09  DATE OF PROCEDURE:  10/28/2014 DATE OF DISCHARGE:  10/28/2014                              OPERATIVE REPORT   PREOPERATIVE DIAGNOSES: 1. Left knee torn medial meniscus. 2. Left knee chondromalacia.  POSTOPERATIVE DIAGNOSES: 1. Left knee torn medial meniscus. 2. Left knee chondromalacia.  PROCEDURES: 1. Left knee partial medial meniscectomy. 2. Left knee abrasion chondroplasty.  ANESTHESIA:  General.  ATTENDING SURGEON:  Lubertha Basque. Jerl Santos, M.D.  ASSISTANT:  Elodia Florence, PA.  INDICATION FOR PROCEDURE:  The patient is a 59 year old man who has had some recent months of intense knee pain.  He had an arthroscopy of this same knee 2 years back and did very well, but experienced recurrent pain over the last several months.  He had some recent back surgery by Dr. Channing Mutters, which did not change his knee pain, and he was referred back in with continued knee difficulty.  He received an injection and some oral anti-inflammatories, but was relegated to the use of a cane.  An MRI scan was suspicious for recurrent meniscal tear.  With terrible difficulty, he was offered a repeat knee arthroscopy.  Informed operative consent was obtained after discussion of possible complications including reaction to anesthesia and infection.  SUMMARY OF FINDINGS AND PROCEDURE:  Under general anesthesia, through his 2 old portals, an arthroscopy of the left knee was performed. Suprapatellar pouch was benign while the patellofemoral joint just exhibited some mild chondromalacia addressed with a brief chondroplasty. The medial compartment exhibited a small posterior horn tear consistent with his scan and about a 5% partial medial meniscectomy was done. There were minimal degenerative changes in  this compartment.  ACL was normal and the lateral compartment exhibited no evidence of meniscal or articular cartilage injury.  He was discharged home the same day.  DESCRIPTION OF PROCEDURE:  The patient was taken to the operating suite where general anesthetic was applied without difficulty.  He was positioned supine and prepped and draped in normal sterile fashion. After the administration of preop IV Kefzol and an appropriate time-out, an arthroscopy of the left knee was performed through a total of 2 portals.  Findings were as noted above and procedure consisted of the partial medial meniscectomy done with basket and shaver involving the posterior horn of the medial meniscus.  Rest of the knee was relatively benign.  The knee was thoroughly irrigated followed by placement of Marcaine with epinephrine and morphine.  Adaptic was placed over the portals followed by dry gauze and loose Ace wrap.  Estimated blood loss and intraoperative fluids can be obtained from anesthesia records.  DISPOSITION:  The patient was extubated in the operating room and taken to the recovery room in a stable condition.  He was to go home same-day and follow up in the office closely.  I will contact him by phone tonight.     Lubertha Basque Jerl Santos, M.D.     PGD/MEDQ  D:  10/28/2014  T:  10/29/2014  Job:  [id number]

## 2015-01-11 ENCOUNTER — Other Ambulatory Visit: Payer: Self-pay | Admitting: Internal Medicine

## 2015-04-11 ENCOUNTER — Other Ambulatory Visit: Payer: Self-pay | Admitting: Internal Medicine

## 2015-04-16 ENCOUNTER — Other Ambulatory Visit: Payer: Self-pay | Admitting: Internal Medicine

## 2015-04-27 ENCOUNTER — Other Ambulatory Visit: Payer: Self-pay | Admitting: Internal Medicine

## 2015-05-08 ENCOUNTER — Encounter: Payer: Self-pay | Admitting: Physician Assistant

## 2015-05-08 ENCOUNTER — Ambulatory Visit (INDEPENDENT_AMBULATORY_CARE_PROVIDER_SITE_OTHER): Payer: BLUE CROSS/BLUE SHIELD | Admitting: Physician Assistant

## 2015-05-08 ENCOUNTER — Other Ambulatory Visit: Payer: Self-pay

## 2015-05-08 VITALS — BP 132/90 | HR 67 | Temp 97.5°F | Resp 16 | Ht 65.0 in | Wt 268.8 lb

## 2015-05-08 DIAGNOSIS — Z0000 Encounter for general adult medical examination without abnormal findings: Secondary | ICD-10-CM

## 2015-05-08 DIAGNOSIS — E119 Type 2 diabetes mellitus without complications: Secondary | ICD-10-CM | POA: Insufficient documentation

## 2015-05-08 DIAGNOSIS — E559 Vitamin D deficiency, unspecified: Secondary | ICD-10-CM | POA: Diagnosis not present

## 2015-05-08 DIAGNOSIS — G8929 Other chronic pain: Secondary | ICD-10-CM

## 2015-05-08 DIAGNOSIS — Z125 Encounter for screening for malignant neoplasm of prostate: Secondary | ICD-10-CM | POA: Diagnosis not present

## 2015-05-08 DIAGNOSIS — E785 Hyperlipidemia, unspecified: Secondary | ICD-10-CM

## 2015-05-08 DIAGNOSIS — Z79899 Other long term (current) drug therapy: Secondary | ICD-10-CM | POA: Insufficient documentation

## 2015-05-08 DIAGNOSIS — E1169 Type 2 diabetes mellitus with other specified complication: Secondary | ICD-10-CM | POA: Insufficient documentation

## 2015-05-08 DIAGNOSIS — Z0001 Encounter for general adult medical examination with abnormal findings: Secondary | ICD-10-CM

## 2015-05-08 DIAGNOSIS — R7309 Other abnormal glucose: Secondary | ICD-10-CM

## 2015-05-08 DIAGNOSIS — Z87442 Personal history of urinary calculi: Secondary | ICD-10-CM

## 2015-05-08 DIAGNOSIS — M5136 Other intervertebral disc degeneration, lumbar region: Secondary | ICD-10-CM

## 2015-05-08 DIAGNOSIS — I10 Essential (primary) hypertension: Secondary | ICD-10-CM

## 2015-05-08 LAB — CBC WITH DIFFERENTIAL/PLATELET
Basophils Absolute: 0 10*3/uL (ref 0.0–0.1)
Basophils Relative: 0 % (ref 0–1)
Eosinophils Absolute: 0.2 10*3/uL (ref 0.0–0.7)
Eosinophils Relative: 2 % (ref 0–5)
HCT: 45.7 % (ref 39.0–52.0)
Hemoglobin: 15.9 g/dL (ref 13.0–17.0)
Lymphocytes Relative: 28 % (ref 12–46)
Lymphs Abs: 2.5 10*3/uL (ref 0.7–4.0)
MCH: 30.1 pg (ref 26.0–34.0)
MCHC: 34.8 g/dL (ref 30.0–36.0)
MCV: 86.6 fL (ref 78.0–100.0)
MPV: 10.4 fL (ref 8.6–12.4)
Monocytes Absolute: 0.6 10*3/uL (ref 0.1–1.0)
Monocytes Relative: 7 % (ref 3–12)
Neutro Abs: 5.6 10*3/uL (ref 1.7–7.7)
Neutrophils Relative %: 63 % (ref 43–77)
Platelets: 312 10*3/uL (ref 150–400)
RBC: 5.28 MIL/uL (ref 4.22–5.81)
RDW: 13.7 % (ref 11.5–15.5)
WBC: 8.9 10*3/uL (ref 4.0–10.5)

## 2015-05-08 LAB — HEMOGLOBIN A1C
Hgb A1c MFr Bld: 6.2 % — ABNORMAL HIGH (ref <5.7)
Mean Plasma Glucose: 131 mg/dL — ABNORMAL HIGH (ref <117)

## 2015-05-08 MED ORDER — FEXOFENADINE HCL 180 MG PO TABS: 180.0000 mg | ORAL_TABLET | Freq: Every day | ORAL | Status: DC

## 2015-05-08 NOTE — Progress Notes (Signed)
Complete Physical  Assessment and Plan: 1. Essential hypertension - continue medications, DASH diet, exercise and monitor at home. Call if greater than 130/80.  - CBC with Differential/Platelet - BASIC METABOLIC PANEL WITH GFR - Hepatic function panel - TSH - Urinalysis, Routine w reflex microscopic (not at [facility]) - Microalbumin / creatinine urine ratio  2. Morbid obesity, unspecified obesity type (HCC) Obesity with co morbidities- long discussion about weight loss, diet, and exercise - Lipid panel - Hemoglobin A1c - Insulin, fasting  3. Hyperlipidemia - Lipid panel  4. Other abnormal glucose Discussed general issues about diabetes pathophysiology and management., Educational material distributed., Suggested low cholesterol diet., Encouraged aerobic exercise., Discussed foot care., Reminded to get yearly retinal exam. - Hemoglobin A1c - Insulin, fasting  5. Vitamin D deficiency - VITAMIN D 25 Hydroxy (Vit-D Deficiency, Fractures)  6. Medication management - Magnesium  7. Screening PSA (prostate specific antigen) - PSA  8. History of nephrolithiasis monitor  9. Encounter for general adult medical examination with abnormal findings - CBC with Differential/Platelet - BASIC METABOLIC PANEL WITH GFR - Hepatic function panel - TSH - Lipid panel - Hemoglobin A1c - Insulin, fasting - Magnesium - VITAMIN D 25 Hydroxy (Vit-D Deficiency, Fractures) - PSA - Urinalysis, Routine w reflex microscopic (not at [facility]) - Microalbumin / creatinine urine ratio  10. Chronic pain Continue follow up pain management  11. DDD (degenerative disc disease), lumbar Follows Dr. Channing Mutters  Discussed med's effects and SE's. Screening labs and tests as requested with regular follow-up as recommended. Over 40 minutes of exam, counseling, chart review and critical decision making was performed  HPI Patient presents for a complete physical. Patient left practice in 2015 but wishing to return to the  practice, has history of non compliance, long discussion about keeping appointment, taking medication and mutual respect in the practice. States that last doctor office was too big at [place].    His blood pressure has been controlled at home, today their BP is BP: 132/90 mmHg He does not workout due to back pain. He denies chest pain, shortness of breath, dizziness.  He had lower back surgery 03/2014 with Dr. Channing Mutters has fusion L1-L2, L4-5, L5-S1, he had knee surgery with Dr. Jerl Santos in sept, he is still walking with cane, had synvisc injections, finished yesterday. Seeing [facility] Robynn Pane at [facility] management.  Has appointment 30th of this month to get more injections.   He is not on cholesterol medication and denies myalgias. His cholesterol is not at goal. The cholesterol last visit was:   Lab Results  Component Value Date   CHOL 264* 08/14/2013   HDL 52 08/14/2013   LDLCALC 181* 08/14/2013   TRIG 157* 08/14/2013   CHOLHDL 5.1 08/14/2013   He has been working on diet and exercise for prediabetes, he is not on bASA, he is on ACE/ARB and denies paresthesia of the feet, polydipsia, polyuria and visual disturbances. Last A1C in the office was:  Lab Results  Component Value Date   HGBA1C 6.3* 08/14/2013  Patient is on Vitamin D supplement.   Lab Results  Component Value Date   VD25OH 63 08/14/2013   Last PSA was: Lab Results  Component Value Date   PSA 2.29 08/14/2013   BMI is Body mass index is 44.73 kg/(m^2)., he is working on diet and exercise. Wt Readings from Last 3 Encounters:  05/08/15 268 lb 12.8 oz (121.927 kg)  10/28/14 260 lb (117.935 kg)  08/14/13 259 lb (117.482 kg)    Current  Medications:  Current Outpatient Prescriptions on File Prior to Visit  Medication Sig Dispense Refill  . amLODipine (NORVASC) 10 MG tablet TAKE 1 TABLET DAILY 90 tablet 1  . lisinopril (PRINIVIL,ZESTRIL) 20 MG tablet TAKE 1 TABLET DAILY 90 tablet 1  . loratadine (CLARITIN) 10 MG tablet TAKE 1  TABLET DAILY 90 tablet 99  . montelukast (SINGULAIR) 10 MG tablet TAKE 1 TABLET (10 MG TOTAL) BY MOUTH DAILY. 30 tablet 3  . omeprazole (PRILOSEC) 40 MG capsule TAKE 1 CAPSULE (40 MG TOTAL) BY MOUTH 2 (TWO) TIMES DAILY. 180 capsule 0   No current facility-administered medications on file prior to visit.   Health Maintenance:  Immunization History  Administered Date(s) Administered  . DTaP 01/13/2010  . Influenza Whole 02/21/2012  . 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  Dentist: rare  Patient Care Team: Lucky CowboyWilliam McKeown, MD as PCP - General (Internal Medicine) Emeline Generalanjan Roy, MD as Referring Physician (Neurosurgery) Lyn RecordsHenry W Smith, MD as Consulting Physician (Cardiology) Sharrell KuJeffrey Medoff, MD as Consulting Physician (Gastroenterology)    Allergies:  Allergies  Allergen Reactions  . Statins Other (See Comments)    Flu-like symptoms  . Phentermine Other (See Comments)    constipation   Medical History:  Past Medical History  Diagnosis Date  . Wears glasses   . Hypertension   . Multiple allergies   . GERD (gastroesophageal reflux disease)   . Hyperlipidemia   . Obese   . DDD lumbar   . DJD (degenerative joint disease)    Surgical History:  Past Surgical History  Procedure Laterality Date  . Ankle fracture surgery  1995    right  . Colonoscopy    . Knee arthroscopy Left 04/24/2012    Procedure: ARTHROSCOPY KNEE, PARTIAL MEDIAL MENISECTOMY AND CHONDROPLASTY;  Surgeon: Velna OchsPeter G Dalldorf, MD;  Location: [facility];  Service: Orthopedics;  Laterality: Left;  . Elbow ligament reconstruction Right 2015    [facility]  . Back surgery  2002    lumb lam-fusion  . Back surgery  03-2014    [place] Channing [place] in [place]  . Knee arthroscopy with medial menisectomy Left 10/28/2014    Procedure: LEFT ARTHROSCOPY KNEE, PARTIAL MEDIAL MENISECTOMY,  CHONDROPLASTY;  Surgeon: Marcene CorningPeter Dalldorf, MD;  Location: [facility];  Service: Orthopedics;  Laterality: Left;  . Chondroplasty Left 10/28/2014    Procedure: CHONDROPLASTY;  Surgeon: Marcene CorningPeter Dalldorf, MD;  Location: [facility];  Service: Orthopedics;  Laterality: Left;   Family History:  Family History  Problem Relation Age of Onset  . Hypertension Mother   . Dementia Mother   . Cancer Father   . Hypertension Father   . Heart disease Father   . Hyperlipidemia Father   . Diabetes Sister   . Cancer Maternal Grandmother     lung   Social History:   Social History  Substance Use Topics  . Smoking status: Former Smoker    Quit date: 02/22/1975  . Smokeless tobacco: None  . Alcohol Use: No   Review of Systems:  Review of Systems  Constitutional: Negative.   HENT: Negative.   Eyes: Negative.   Respiratory: Negative.   Cardiovascular: Negative.   Gastrointestinal: Negative.   Genitourinary: Negative.   Musculoskeletal: Positive for myalgias, back pain and joint pain.  Skin: Negative.   Neurological: Negative.   Endo/Heme/Allergies: Negative.   Psychiatric/Behavioral: Negative.  Physical Exam: Estimated body mass index is 44.73 kg/(m^2) as calculated from the following:   Height as of this encounter:  (1.651 m).   Weight as of this encounter: 268 lb 12.8 oz (121.927 kg). BP 132/90 mmHg  Pulse 67  Temp(Src) 97.5 F (36.4 C) (Temporal)  Resp 16  Ht  (1.651 m)  Wt 268 lb 12.8 oz (121.927 kg)  BMI 44.73 kg/m2  SpO2 95% General Appearance: Well nourished, in no apparent distress.  Eyes: PERRLA, EOMs, conjunctiva no swelling or erythema, normal fundi and vessels.  Sinuses: No Frontal/maxillary tenderness  ENT/Mouth: Ext aud canals clear, normal light reflex with TMs without erythema, bulging. Good dentition. No erythema, swelling, or exudate on post pharynx. Tonsils not swollen or erythematous. Hearing normal.  Neck: Supple, thyroid  normal. No bruits  Respiratory: Respiratory effort normal, BS equal bilaterally without rales, rhonchi, wheezing or stridor.  Cardio: RRR without murmurs, rubs or gallops. Brisk peripheral pulses without edema.  Chest: symmetric, with normal excursions and percussion.  Abdomen: Soft, nontender, no guarding, rebound, hernias, masses, or organomegaly.  Lymphatics: Non tender without lymphadenopathy.  Genitourinary: defer Musculoskeletal: Full ROM all peripheral extremities,5/5 strength, and normal gait.  Skin: Warm, dry without rashes, lesions, ecchymosis. Neuro: Cranial nerves intact, reflexes equal bilaterally. Normal muscle tone, no cerebellar symptoms. Sensation intact.  Psych: Awake and oriented X 3, normal affect, Insight and Judgment appropriate.   EKG: declines AORTA SCAN:  declines  Quentin Mulling 11:15 AM [facility]

## 2015-05-08 NOTE — Patient Instructions (Addendum)
Add ENTERIC COATED low dose 81 mg Aspirin daily OR can do every other day if you have easy bruising to protect your heart and head. As well as to reduce risk of Colon Cancer by 20 %, Skin Cancer by 26 % , Melanoma by 46% and Pancreatic cancer by 60%  Please pick one of the over the counter allergy medications below and take it once daily for allergies.  Claritin or loratadine cheapest but likely the weakest  Zyrtec or certizine at night because it can make you sleepy The strongest is allegra or fexafinadine  Cheapest at [facility], [employer], [employer]   We want weight loss that will last so you should lose 1-2 pounds a week.  THAT IS IT! Please pick THREE things a month to change. Once it is a habit check off the item. Then pick another three items off the list to become habits.  If you are already doing a habit on the list GREAT!  Cross that item off! o Don't drink your calories. Ie, alcohol, soda, fruit juice, and sweet tea.  o Drink more water. Drink a glass when you feel hungry or before each meal.  o Eat breakfast - Complex carb and protein (likeDannon light and fit yogurt, oatmeal, fruit, eggs, [place]key bacon). o Measure your cereal.  Eat no more than one cup a day. (ie [place]ashi) o Eat an apple a day. o Add a vegetable a day. o Try a new vegetable a month. o Use Pam! Stop using oil or butter to [place]. o Don't finish your plate or use smaller plates. o Share your dessert. o Eat sugar free Jello for dessert or frozen grapes. o Don't eat 2-3 hours before bed. o Switch to whole wheat bread, pasta, and brown rice. o Make healthier choices when you eat out. No fries! o Pick baked chicken, NOT fried. o Don't forget to SLOW DOWN when you eat. It is not going anywhere.  o Take the stairs. o Park far away in the parking lot o [employer] Farm[employer] soup cans (or weights) for 10 minutes while watching TV. o Walk at work for 10 minutes during break. o Walk outside 1 time a week with your friend, kids, dog, or significant  other. o Start a walking group at church. o Walk the mall as much as you can tolerate.  o Keep a food diary. o Weigh yourself daily. o Walk for 15 minutes 3 days per week. o [place] at home more often and eat out less.  If life happens and you go back to old habits, it is okay.  Just start over. You can do it!   If you experience chest pain, get short of breath, or tired during the exercise, please stop immediately and inform your doctor.      Bad carbs also include fruit juice, alcohol, and sweet tea. These are empty calories that do not signal to your brain that you are full.   Please remember the good carbs are still carbs which convert into sugar. So please measure them out no more than 1/2-1 cup of rice, oatmeal, pasta, and beans  Veggies are however free foods! Pile them on.   Not all fruit is created equal. Please see the list below, the fruit at the bottom is higher in sugars than the fruit at the top. Please avoid all dried fruits.

## 2015-05-09 LAB — HEPATIC FUNCTION PANEL
ALT: 35 U/L (ref 9–46)
AST: 23 U/L (ref 10–35)
Albumin: 4.7 g/dL (ref 3.6–5.1)
Alkaline Phosphatase: 124 U/L — ABNORMAL HIGH (ref 40–115)
Bilirubin, Direct: 0.1 mg/dL (ref <=0.2)
Indirect Bilirubin: 0.5 mg/dL (ref 0.2–1.2)
Total Bilirubin: 0.6 mg/dL (ref 0.2–1.2)
Total Protein: 7.8 g/dL (ref 6.1–8.1)

## 2015-05-09 LAB — BASIC METABOLIC PANEL WITH GFR
BUN: 15 mg/dL (ref 7–25)
CO2: 26 mmol/L (ref 20–31)
Calcium: 10.2 mg/dL (ref 8.6–10.3)
Chloride: 100 mmol/L (ref 98–110)
Creat: 1.06 mg/dL (ref 0.70–1.33)
GFR, Est African American: 89 mL/min (ref >=60)
GFR, Est Non African American: 77 mL/min (ref >=60)
Glucose, Bld: 115 mg/dL — ABNORMAL HIGH (ref 65–99)
Potassium: 4.7 mmol/L (ref 3.5–5.3)
Sodium: 139 mmol/L (ref 135–146)

## 2015-05-09 LAB — LIPID PANEL
Cholesterol: 267 mg/dL — ABNORMAL HIGH (ref 125–200)
HDL: 46 mg/dL (ref >=40)
LDL Cholesterol: 180 mg/dL — ABNORMAL HIGH (ref <130)
Total CHOL/HDL Ratio: 5.8 Ratio — ABNORMAL HIGH (ref <=5.0)
Triglycerides: 206 mg/dL — ABNORMAL HIGH (ref <150)
VLDL: 41 mg/dL — ABNORMAL HIGH (ref <30)

## 2015-05-09 LAB — INSULIN, FASTING: Insulin fasting, serum: 17.1 u[IU]/mL (ref 2.0–19.6)

## 2015-05-09 LAB — PSA: PSA: 1.06 ng/mL (ref <=4.00)

## 2015-05-09 LAB — MAGNESIUM: Magnesium: 2 mg/dL (ref 1.5–2.5)

## 2015-05-09 LAB — TSH: TSH: 1.42 mIU/L (ref 0.40–4.50)

## 2015-05-09 LAB — VITAMIN D 25 HYDROXY (VIT D DEFICIENCY, FRACTURES): Vit D, 25-Hydroxy: 22 ng/mL — ABNORMAL LOW (ref 30–100)

## 2015-05-11 LAB — URINALYSIS, ROUTINE W REFLEX MICROSCOPIC

## 2015-05-11 LAB — MICROALBUMIN / CREATININE URINE RATIO

## 2015-05-28 ENCOUNTER — Other Ambulatory Visit: Payer: Self-pay | Admitting: Internal Medicine

## 2015-06-04 ENCOUNTER — Other Ambulatory Visit: Payer: Self-pay | Admitting: Orthopaedic Surgery

## 2015-06-04 DIAGNOSIS — M25552 Pain in left hip: Principal | ICD-10-CM

## 2015-06-04 DIAGNOSIS — M25551 Pain in right hip: Secondary | ICD-10-CM

## 2015-06-05 ENCOUNTER — Ambulatory Visit
Admission: RE | Admit: 2015-06-05 | Discharge: 2015-06-05 | Disposition: A | Discharge status: Home / Self Care | Payer: BLUE CROSS/BLUE SHIELD | Source: Ambulatory Visit | Attending: Orthopaedic Surgery | Admitting: Orthopaedic Surgery

## 2015-06-05 DIAGNOSIS — M25551 Pain in right hip: Secondary | ICD-10-CM

## 2015-06-05 DIAGNOSIS — M25552 Pain in left hip: Principal | ICD-10-CM

## 2015-06-05 MED ORDER — IOHEXOL 180 MG/ML  SOLN
1.0000 mL | Freq: Once | INTRAMUSCULAR | Status: AC | PRN
  Administered 2015-06-05: 1 mL via INTRA_ARTICULAR

## 2015-06-05 MED ORDER — METHYLPREDNISOLONE ACETATE 40 MG/ML INJ SUSP (RADIOLOG
120.0000 mg | Freq: Once | INTRAMUSCULAR | Status: AC
  Administered 2015-06-05: 120 mg via INTRA_ARTICULAR

## 2015-06-15 ENCOUNTER — Encounter: Payer: Self-pay | Admitting: Physician Assistant

## 2015-06-15 ENCOUNTER — Other Ambulatory Visit: Payer: Self-pay | Admitting: Orthopaedic Surgery

## 2015-06-15 ENCOUNTER — Ambulatory Visit (INDEPENDENT_AMBULATORY_CARE_PROVIDER_SITE_OTHER): Payer: BLUE CROSS/BLUE SHIELD | Admitting: Physician Assistant

## 2015-06-15 DIAGNOSIS — R945 Abnormal results of liver function studies: Secondary | ICD-10-CM

## 2015-06-15 DIAGNOSIS — R7989 Other specified abnormal findings of blood chemistry: Secondary | ICD-10-CM | POA: Diagnosis not present

## 2015-06-15 DIAGNOSIS — M160 Bilateral primary osteoarthritis of hip: Secondary | ICD-10-CM

## 2015-06-15 LAB — HEPATIC FUNCTION PANEL
ALT: 31 U/L (ref 9–46)
AST: 19 U/L (ref 10–35)
Albumin: 4.3 g/dL (ref 3.6–5.1)
Alkaline Phosphatase: 102 U/L (ref 40–115)
Bilirubin, Direct: 0.1 mg/dL (ref <=0.2)
Indirect Bilirubin: 0.6 mg/dL (ref 0.2–1.2)
Total Bilirubin: 0.7 mg/dL (ref 0.2–1.2)
Total Protein: 6.7 g/dL (ref 6.1–8.1)

## 2015-06-15 LAB — HEPATITIS C ANTIBODY: HCV Ab: NEGATIVE

## 2015-06-15 LAB — IRON AND TIBC
%SAT: 26 % (ref 15–60)
Iron: 106 ug/dL (ref 50–180)
TIBC: 406 ug/dL (ref 250–425)
UIBC: 300 ug/dL (ref 125–400)

## 2015-06-15 LAB — FERRITIN: Ferritin: 279 ng/mL (ref 20–380)

## 2015-06-15 NOTE — Patient Instructions (Signed)
Fatty Liver Fatty liver is the accumulation of fat in liver cells. It is also called hepatosteatosis or steatohepatitis. It is normal for your liver to contain some fat. If fat is more than 5 to 10% of your liver's weight, you have fatty liver.  There are often no symptoms (problems) for years while damage is still occurring. People often learn about their fatty liver when they have medical tests for other reasons. Fat can damage your liver for years or even decades without causing problems. When it becomes severe, it can cause fatigue, weight loss, weakness, and confusion. This makes you more likely to develop more serious liver problems. The liver is the largest organ in the body. It does a lot of work and often gives no warning signs when it is sick until late in a disease. The liver has many important jobs including:  Breaking down foods.  Storing vitamins, iron, and other minerals.  Making proteins.  Making bile for food digestion.  Breaking down many products including medications, alcohol and some poisons.  PROGNOSIS  Fatty liver may cause no damage or it can lead to an inflammation of the liver. This is, called steatohepatitis.  Over time the liver may become scarred and hardened. This condition is called cirrhosis. Cirrhosis is serious and may lead to liver failure or cancer. NASH is one of the leading causes of cirrhosis. About 10-20% of Americans have fatty liver and a smaller 2-5% has NASH.  TREATMENT   Weight loss, fat restriction, and exercise in overweight patients produces inconsistent results but is worth trying.  Good control of diabetes may reduce fatty liver.  Eat a balanced, healthy diet.  Increase your physical activity.  There are no medical or surgical treatments for a fatty liver or NASH, but improving your diet and increasing your exercise may help prevent or reverse some of the damage.   We want weight loss that will last so you should lose 1-2 pounds a  week.  THAT IS IT! Please pick THREE things a month to change. Once it is a habit check off the item. Then pick another three items off the list to become habits.  If you are already doing a habit on the list GREAT!  Cross that item off! o Don't drink your calories. Ie, alcohol, soda, fruit juice, and sweet tea.  o Drink more water. Drink a glass when you feel hungry or before each meal.  o Eat breakfast - Complex carb and protein (likeDannon light and fit yogurt, oatmeal, fruit, eggs, turkey bacon). o Measure your cereal.  Eat no more than one cup a day. (ie Kashi) o Eat an apple a day. o Add a vegetable a day. o Try a new vegetable a month. o Use Pam! Stop using oil or butter to [place]. o Don't finish your plate or use smaller plates. o Share your dessert. o Eat sugar free Jello for dessert or frozen grapes. o Don't eat 2-3 hours before bed. o Switch to whole wheat bread, pasta, and brown rice. o Make healthier choices when you eat out. No fries! o Pick baked chicken, NOT fried. o Don't forget to SLOW DOWN when you eat. It is not going anywhere.  o Take the stairs. o Park far away in the parking lot o Lift soup cans (or weights) for 10 minutes while watching TV. o Walk at work for 10 minutes during break. o Walk outside 1 time a week with your friend, kids, dog, or significant other. o   Start a walking group at church. o Walk the mall as much as you can tolerate.  o Keep a food diary. o Weigh yourself daily. o Walk for 15 minutes 3 days per week. o Eischeid at home more often and eat out less.  If life happens and you go back to old habits, it is okay.  Just start over. You can do it!   If you experience chest pain, get short of breath, or tired during the exercise, please stop immediately and inform your doctor.   

## 2015-06-15 NOTE — Progress Notes (Signed)
Assessment and Plan: Elevated LFTs- check hepatitis C, check LFTs, check iron/ferritin, likely due to being over weight- discussed weight loss.  Obesity with co morbidities- long discussion about weight loss, diet, and exercise  HPI 60 y.o.male presents for follow up from CPE, he had elevated LFTs at that time. Normal CT AB 2005, does have obesity and is on oxycodone with pain management, no ETOH or tylenol use. He does need a Hepatitis screening, has not had Iron, ferritin. Denies AB pain, no easy bruising/bleeding, no constipation/diarrhea, no HA, changes in vision.  Has bilateral hip/back pain, has bilateral hip pain, had injection on the right hip and states that is helped a lot.  BMI is Body mass index is 44.43 kg/(m^2)., he is working on diet and exercise. Wt Readings from Last 3 Encounters:  06/15/15 267 lb (121.11 kg)  05/08/15 268 lb 12.8 oz (121.927 kg)  10/28/14 260 lb (117.935 kg)    Past Medical History  Diagnosis Date  . Wears glasses   . Hypertension   . Multiple allergies   . GERD (gastroesophageal reflux disease)   . Hyperlipidemia   . Obese   . DDD lumbar   . DJD (degenerative joint disease)      Allergies  Allergen Reactions  . Statins Other (See Comments)    Flu-like symptoms  . Phentermine Other (See Comments)    constipation      Current Outpatient Prescriptions on File Prior to Visit  Medication Sig Dispense Refill  . amLODipine (NORVASC) 10 MG tablet TAKE 1 TABLET DAILY 90 tablet 1  . Calcium Carbonate-Vit D-Min (CALCIUM 1200 PO) Take by mouth daily.    . fexofenadine (ALLEGRA) 180 MG tablet Take 1 tablet (180 mg total) by mouth daily. 30 tablet 0  . gabapentin (NEURONTIN) 800 MG tablet   1  . lisinopril (PRINIVIL,ZESTRIL) 20 MG tablet TAKE 1 TABLET DAILY 90 tablet 1  . loratadine (CLARITIN) 10 MG tablet TAKE 1 TABLET DAILY 90 tablet 99  . meloxicam (MOBIC) 15 MG tablet   1  . montelukast (SINGULAIR) 10 MG tablet TAKE 1 TABLET (10 MG TOTAL) BY MOUTH  DAILY. 30 tablet 3  . omeprazole (PRILOSEC) 40 MG capsule TAKE 1 CAPSULE (40 MG TOTAL) BY MOUTH 2 (TWO) TIMES DAILY. 180 capsule 0  . oxyCODONE-acetaminophen (PERCOCET/ROXICET) 5-325 MG tablet   0   No current facility-administered medications on file prior to visit.    ROS: all negative except above.   Physical Exam: Filed Weights   06/15/15 1102  Weight: 267 lb (121.11 kg)   BP 132/80 mmHg  Pulse 82  Temp(Src) 97.3 F (36.3 C) (Temporal)  Resp 16  Ht  (1.651 m)  Wt 267 lb (121.11 kg)  BMI 44.43 kg/m2  SpO2 97% eneral Appearance: Well nourished, in no apparent distress.  Eyes: PERRLA, EOMs, conjunctiva no swelling or erythema, normal fundi and vessels.  Sinuses: No Frontal/maxillary tenderness  ENT/Mouth: Ext aud canals clear, normal light reflex with TMs without erythema, bulging. Good dentition. No erythema, swelling, or exudate on post pharynx. Tonsils not swollen or erythematous. Hearing normal.  Neck: Supple, thyroid normal. No bruits  Respiratory: Respiratory effort normal, BS equal bilaterally without rales, rhonchi, wheezing or stridor.  Cardio: RRR without murmurs, rubs or gallops. Brisk peripheral pulses without edema.  Chest: symmetric, with normal excursions and percussion.  Abdomen: Soft, obese,  nontender, no guarding, rebound, hernias, masses, or organomegaly.  Lymphatics: Non tender without lymphadenopathy.  Musculoskeletal: Full ROM all peripheral extremities,5/4 strength,  walks with cane, antalgic gait.  Skin: Warm, dry without rashes, lesions, ecchymosis. Neuro: Cranial nerves intact, reflexes equal bilaterally. Normal muscle tone, no cerebellar symptoms. Psych: Awake and oriented X 3, normal affect, Insight and Judgment appropriate.  Quentin MullingAmanda Collier, PA-C 11:13 AM [facility]

## 2015-06-16 LAB — ANTI-DNA ANTIBODY, DOUBLE-STRANDED: ds DNA Ab: 2 IU/mL

## 2015-06-16 LAB — ANA: Anti Nuclear Antibody(ANA): NEGATIVE

## 2015-06-22 ENCOUNTER — Ambulatory Visit
Admission: RE | Admit: 2015-06-22 | Discharge: 2015-06-22 | Disposition: A | Discharge status: Home / Self Care | Payer: BLUE CROSS/BLUE SHIELD | Source: Ambulatory Visit | Attending: Orthopaedic Surgery | Admitting: Orthopaedic Surgery

## 2015-06-22 DIAGNOSIS — M160 Bilateral primary osteoarthritis of hip: Secondary | ICD-10-CM

## 2015-06-22 MED ORDER — METHYLPREDNISOLONE ACETATE 40 MG/ML INJ SUSP (RADIOLOG
120.0000 mg | Freq: Once | INTRAMUSCULAR | Status: AC
  Administered 2015-06-22: 120 mg via INTRA_ARTICULAR

## 2015-06-22 MED ORDER — IOPAMIDOL (ISOVUE-M 200) INJECTION 41%
1.0000 mL | Freq: Once | INTRAMUSCULAR | Status: AC
  Administered 2015-06-22: 1 mL via INTRA_ARTICULAR

## 2015-07-27 ENCOUNTER — Other Ambulatory Visit: Payer: Self-pay | Admitting: Internal Medicine

## 2015-09-02 ENCOUNTER — Other Ambulatory Visit: Payer: Self-pay | Admitting: Orthopaedic Surgery

## 2015-09-07 NOTE — Pre-Procedure Instructions (Signed)
Laverda Sorensonaul T Hladik  09/07/2015     Your procedure is scheduled on : Tuesday September 22, 2015 at 7:30 AM.  [facility] AM.  Call this number if you have problems the morning of surgery: [phone]    Remember:  Do not eat food or drink liquids after midnight.  Take these medicines the morning of surgery with A SIP OF WATER : Amlodipine (Norvasc), Gabapentin (Neurontin), Loratadine (Claritin), Omeprazole (Prilosec), and Oxycodone if needed   Stop taking any vitamins, herbal medications/supplements, NSAIDs, Ibuprofen, Advil, Motrin, Aleve, Meloxicam/Mobic, L-Lysine etc on Tuesday July 25th   Do not wear jewelry.  Do not wear lotions, powders, or cologne.    Men may shave face and neck.  Do not bring valuables to the hospital.  [facility] is not responsible for any belongings or valuables.  Contacts, dentures or bridgework may not be worn into surgery.  Leave your suitcase in the car.  After surgery it may be brought to your room.  For patients admitted to the hospital, discharge time will be determined by your treatment team.  Patients discharged the day of surgery will not be allowed to drive home.   Name and phone number of your driver:    Special instructions:  Shower using CHG soap the night before and the morning of your surgery  Please read over the following fact sheets that you were given. MRSA Information

## 2015-09-08 ENCOUNTER — Encounter (HOSPITAL_COMMUNITY): Payer: Self-pay

## 2015-09-08 ENCOUNTER — Ambulatory Visit (HOSPITAL_COMMUNITY)
Admission: RE | Admit: 2015-09-08 | Discharge: 2015-09-08 | Disposition: A | Discharge status: Home / Self Care | Payer: BLUE CROSS/BLUE SHIELD | Source: Ambulatory Visit | Attending: Orthopaedic Surgery | Admitting: Orthopaedic Surgery

## 2015-09-08 ENCOUNTER — Encounter (HOSPITAL_COMMUNITY)
Admission: RE | Admit: 2015-09-08 | Discharge: 2015-09-08 | Disposition: A | Discharge status: Home / Self Care | Payer: BLUE CROSS/BLUE SHIELD | Source: Ambulatory Visit | Attending: Orthopaedic Surgery | Admitting: Orthopaedic Surgery

## 2015-09-08 DIAGNOSIS — Z01818 Encounter for other preprocedural examination: Secondary | ICD-10-CM | POA: Diagnosis not present

## 2015-09-08 HISTORY — DX: Nausea with vomiting, unspecified: R11.2

## 2015-09-08 HISTORY — DX: Other specified postprocedural states: Z98.890

## 2015-09-08 HISTORY — DX: Other specified postprocedural states: R11.2

## 2015-09-08 LAB — CBC WITH DIFFERENTIAL/PLATELET
Basophils Absolute: 0 10*3/uL (ref 0.0–0.1)
Basophils Relative: 0 %
Eosinophils Absolute: 0.2 10*3/uL (ref 0.0–0.7)
Eosinophils Relative: 3 %
HCT: 47.7 % (ref 39.0–52.0)
Hemoglobin: 15.9 g/dL (ref 13.0–17.0)
Lymphocytes Relative: 27 %
Lymphs Abs: 1.8 10*3/uL (ref 0.7–4.0)
MCH: 28.7 pg (ref 26.0–34.0)
MCHC: 33.3 g/dL (ref 30.0–36.0)
MCV: 86.1 fL (ref 78.0–100.0)
Monocytes Absolute: 0.4 10*3/uL (ref 0.1–1.0)
Monocytes Relative: 6 %
Neutro Abs: 4.4 10*3/uL (ref 1.7–7.7)
Neutrophils Relative %: 64 %
Platelets: 288 10*3/uL (ref 150–400)
RBC: 5.54 MIL/uL (ref 4.22–5.81)
RDW: 12.6 % (ref 11.5–15.5)
WBC: 6.8 10*3/uL (ref 4.0–10.5)

## 2015-09-08 LAB — TYPE AND SCREEN
ABO/RH(D): A POS
Antibody Screen: NEGATIVE

## 2015-09-08 LAB — PROTIME-INR
INR: 1 (ref 0.00–1.49)
Prothrombin Time: 13.4 seconds (ref 11.6–15.2)

## 2015-09-08 LAB — URINALYSIS, ROUTINE W REFLEX MICROSCOPIC
Bilirubin Urine: NEGATIVE
Glucose, UA: NEGATIVE mg/dL
Hgb urine dipstick: NEGATIVE
Ketones, ur: NEGATIVE mg/dL
Leukocytes, UA: NEGATIVE
Nitrite: NEGATIVE
Protein, ur: NEGATIVE mg/dL
Specific Gravity, Urine: 1.017 (ref 1.005–1.030)
pH: 5.5 (ref 5.0–8.0)

## 2015-09-08 LAB — BASIC METABOLIC PANEL
Anion gap: 7 (ref 5–15)
BUN: 10 mg/dL (ref 6–20)
CO2: 26 mmol/L (ref 22–32)
Calcium: 10 mg/dL (ref 8.9–10.3)
Chloride: 107 mmol/L (ref 101–111)
Creatinine, Ser: 1.11 mg/dL (ref 0.61–1.24)
GFR calc Af Amer: >60 mL/min (ref >60)
GFR calc non Af Amer: >60 mL/min (ref >60)
Glucose, Bld: 132 mg/dL — ABNORMAL HIGH (ref 65–99)
Potassium: 4.2 mmol/L (ref 3.5–5.1)
Sodium: 140 mmol/L (ref 135–145)

## 2015-09-08 LAB — SURGICAL PCR SCREEN
MRSA, PCR: NEGATIVE
Staphylococcus aureus: POSITIVE — AB

## 2015-09-08 LAB — ABO/RH: ABO/RH(D): A POS

## 2015-09-08 LAB — APTT: aPTT: 30 seconds (ref 24–37)

## 2015-09-08 NOTE — Progress Notes (Signed)
PCP - Dr. Lucky CowboyWilliam McKeown Cardiologist - denies  EKG - 10/22/14 CXR - 09/08/15  Echo- denies Stress test- 2007? Cardiac Cath - 2007 - pt. States that he had a stress test and Dr. Katrinka BlazingSmith wanted to follow up with a cardiac cath but cardiac cath was normal; Patient has not seen a cardiologist since  Patient denies chest pain and shortness of breath at PAT appointment.

## 2015-09-08 NOTE — Progress Notes (Signed)
   09/08/15 1026  OBSTRUCTIVE SLEEP APNEA  Have you ever been diagnosed with sleep apnea through a sleep study? No  Do you snore loudly (loud enough to be heard through closed doors)?  0  Do you often feel tired, fatigued, or sleepy during the daytime (such as falling asleep during driving or talking to someone)? 0  Has anyone observed you stop breathing during your sleep? 0  Do you have, or are you being treated for high blood pressure? 1  BMI more than 35 kg/m2? 1  Age > 50 (1-yes) 1  Neck circumference greater than:Male 16 inches or larger, Male 17inches or larger? 1  Male Gender (Yes=1) 1  Obstructive Sleep Apnea Score 5

## 2015-09-08 NOTE — Progress Notes (Signed)
Left message for patient informing of positive PCR results.  [facility]

## 2015-09-15 NOTE — H&P (Signed)
TOTAL HIP ADMISSION H&P  Patient is admitted for right total hip arthroplasty.  Subjective:  Chief Complaint: right hip pain  HPI: John Andrade, 60 y.o. male, has a history of pain and functional disability in the right hip(s) due to arthritis and patient has failed non-surgical conservative treatments for greater than 12 weeks to include NSAID's and/or analgesics, corticosteriod injections, flexibility and strengthening excercises, use of assistive devices, weight reduction as appropriate and activity modification.  Onset of symptoms was gradual starting 5 years ago with gradually worsening course since that time.The patient noted no past surgery on the right hip(s).  Patient currently rates pain in the right hip at 10 out of 10 with activity. Patient has night pain, worsening of pain with activity and weight bearing, trendelenberg gait, pain that interfers with activities of daily living and crepitus. Patient has evidence of subchondral cysts, subchondral sclerosis, periarticular osteophytes and joint space narrowing by imaging studies. This condition presents safety issues increasing the risk of falls. There is no current active infection.  Patient Active Problem List   Diagnosis Date Noted  . Other abnormal glucose 05/08/2015  . Vitamin D deficiency 05/08/2015  . Medication management 05/08/2015  . History of nephrolithiasis 05/08/2015  . Chronic pain 05/08/2015  . DDD (degenerative disc disease), lumbar 05/08/2015  . Morbidly obese (HCC)   . Hyperlipidemia   . Hypertension    Past Medical History:  Diagnosis Date  . DDD lumbar   . DJD (degenerative joint disease)   . GERD (gastroesophageal reflux disease)   . Hyperlipidemia   . Hypertension   . Multiple allergies   . Obese   . PONV (postoperative nausea and vomiting)   . Wears glasses     Past Surgical History:  Procedure Laterality Date  . ANKLE FRACTURE SURGERY  1995   right  . BACK SURGERY  2002   lumb lam-fusion  .  BACK SURGERY  03-2014   Dr Channing Mutters in [place]  . CARDIAC CATHETERIZATION     2007  . CHONDROPLASTY Left 10/28/2014   Procedure: CHONDROPLASTY;  Surgeon: Marcene Corning, MD;  Location: [facility];  Service: Orthopedics;  Laterality: Left;  . COLONOSCOPY    . ELBOW LIGAMENT RECONSTRUCTION Right 2015   [place]  . KNEE ARTHROSCOPY Left 04/24/2012   Procedure: ARTHROSCOPY KNEE, PARTIAL MEDIAL MENISECTOMY AND CHONDROPLASTY;  Surgeon: [name], MD;  Location: [facility];  Service: Orthopedics;  Laterality: Left;  . KNEE ARTHROSCOPY WITH MEDIAL MENISECTOMY Left 10/28/2014   Procedure: LEFT ARTHROSCOPY KNEE, PARTIAL MEDIAL MENISECTOMY, CHONDROPLASTY;  Surgeon: Marcene Corning, MD;  Location: [facility];  Service: Orthopedics;  Laterality: Left;    No prescriptions prior to admission.   Allergies  Allergen Reactions  . Statins Other (See Comments)    Flu-like symptoms  . Phentermine Other (See Comments)    constipation    Social History  Substance Use Topics  . Smoking status: Former Smoker    Quit date: 02/22/1975  . Smokeless tobacco: Not on file  . Alcohol use No    Family History  Problem Relation Age of Onset  . Hypertension Mother   . Dementia Mother   . Cancer Father   . Hypertension Father   . Heart disease Father   . Hyperlipidemia Father   . Diabetes Sister   . Cancer Maternal Grandmother     lung     Review of Systems  Musculoskeletal: Positive for joint pain.  Right hip  All other systems reviewed and are negative.   Objective:  Physical Exam  Constitutional: He is oriented to person, place, and time. He appears well-developed and well-nourished.  HENT:  Head: Normocephalic and atraumatic.  Eyes: Pupils are equal, round, and reactive to light.  Neck: Normal range of motion.  Cardiovascular: Normal rate and regular rhythm.   Respiratory: Effort normal.  GI: Soft.  Musculoskeletal:  He continues with very limited  motion of both hips. Internal rotation is very painful on both sides. Leg lengths look about equal. He walks with an altered antalgic gait. Sensation and motor function are intact in his feet with palpable pulses on both sides. There is no palpable lymphadenopathy behind either knee.   Neurological: He is alert and oriented to person, place, and time.  Skin: Skin is warm and dry.  Psychiatric: He has a normal mood and affect. His behavior is normal. Judgment and thought content normal.    Vital signs in last 24 hours:    Labs:   Estimated body mass index is 41.72 kg/m as calculated from the following:   Height as of 09/08/15: 5\' 6"  (1.676 m).   Weight as of 09/08/15: 117.3 kg (258 lb 8 oz).   Imaging Review Plain radiographs demonstrate severe degenerative joint disease of the right hip(s). The bone quality appears to be good for age and reported activity level.  Assessment/Plan:  End stage primary arthritis, right hip(s)  The patient history, physical examination, clinical judgement of the provider and imaging studies are consistent with end stage degenerative joint disease of the right hip(s) and total hip arthroplasty is deemed medically necessary. The treatment options including medical management, injection therapy, arthroscopy and arthroplasty were discussed at length. The risks and benefits of total hip arthroplasty were presented and reviewed. The risks due to aseptic loosening, infection, stiffness, dislocation/subluxation,  thromboembolic complications and other imponderables were discussed.  The patient acknowledged the explanation, agreed to proceed with the plan and consent was signed. Patient is being admitted for inpatient treatment for surgery, pain control, PT, OT, prophylactic antibiotics, VTE prophylaxis, progressive ambulation and ADL's and discharge planning.The patient is planning to be discharged home with home health services

## 2015-09-21 NOTE — Anesthesia Preprocedure Evaluation (Addendum)
Anesthesia Evaluation  Patient identified by MRN, date of birth, ID band Patient awake    Reviewed: Allergy & Precautions, H&P , Patient's Chart, lab work & pertinent test results, reviewed documented beta blocker date and time   Airway Mallampati: II  TM Distance: >3 FB Neck ROM: full    Dental no notable dental hx.    Pulmonary former smoker,    Pulmonary exam normal breath sounds clear to auscultation       Cardiovascular hypertension,  Rhythm:regular Rate:Normal     Neuro/Psych    GI/Hepatic GERD  ,  Endo/Other  Morbid obesity  Renal/GU      Musculoskeletal   Abdominal   Peds  Hematology   Anesthesia Other Findings Hypertension    Multiple allergies   GERD (gastroesophageal reflux disease)    Hyperlipidemia   Obese         Reproductive/Obstetrics                            Anesthesia Physical Anesthesia Plan  ASA: II  Anesthesia Plan: General   Post-op Pain Management:    Induction: Intravenous  Airway Management Planned: Oral ETT  Additional Equipment:   Intra-op Plan:   Post-operative Plan: Extubation in OR  Informed Consent: I have reviewed the patients History and Physical, chart, labs and discussed the procedure including the risks, benefits and alternatives for the proposed anesthesia with the patient or authorized representative who has indicated his/her understanding and acceptance.   Dental Advisory Given and Dental advisory given  Plan Discussed with: CRNA and Surgeon  Anesthesia Plan Comments: (  Discussed general anesthesia, including possible nausea, instrumentation of airway, sore throat,pulmonary aspiration, etc. I asked if the were any outstanding questions, or  concerns before we proceeded. )        Anesthesia Quick Evaluation

## 2015-09-22 ENCOUNTER — Inpatient Hospital Stay (HOSPITAL_COMMUNITY): Payer: BLUE CROSS/BLUE SHIELD

## 2015-09-22 ENCOUNTER — Inpatient Hospital Stay (HOSPITAL_COMMUNITY): Payer: BLUE CROSS/BLUE SHIELD | Admitting: Anesthesiology

## 2015-09-22 ENCOUNTER — Inpatient Hospital Stay (HOSPITAL_COMMUNITY)
Admission: RE | Admit: 2015-09-22 | Discharge: 2015-09-24 | DRG: 470 | Disposition: A | Discharge status: Home Health | Payer: BLUE CROSS/BLUE SHIELD | Source: Ambulatory Visit | Attending: Orthopaedic Surgery | Admitting: Orthopaedic Surgery

## 2015-09-22 ENCOUNTER — Encounter (HOSPITAL_COMMUNITY)
Admission: RE | Disposition: A | Discharge status: Home Health | Payer: Self-pay | Source: Ambulatory Visit | Attending: Orthopaedic Surgery

## 2015-09-22 ENCOUNTER — Encounter (HOSPITAL_COMMUNITY): Payer: Self-pay | Admitting: *Deleted

## 2015-09-22 DIAGNOSIS — K219 Gastro-esophageal reflux disease without esophagitis: Secondary | ICD-10-CM | POA: Diagnosis present

## 2015-09-22 DIAGNOSIS — Z6841 Body Mass Index (BMI) 40.0 and over, adult: Secondary | ICD-10-CM | POA: Diagnosis not present

## 2015-09-22 DIAGNOSIS — Z888 Allergy status to other drugs, medicaments and biological substances status: Secondary | ICD-10-CM | POA: Diagnosis not present

## 2015-09-22 DIAGNOSIS — Z87891 Personal history of nicotine dependence: Secondary | ICD-10-CM

## 2015-09-22 DIAGNOSIS — M1611 Unilateral primary osteoarthritis, right hip: Principal | ICD-10-CM | POA: Diagnosis present

## 2015-09-22 DIAGNOSIS — E785 Hyperlipidemia, unspecified: Secondary | ICD-10-CM | POA: Diagnosis present

## 2015-09-22 DIAGNOSIS — Z419 Encounter for procedure for purposes other than remedying health state, unspecified: Secondary | ICD-10-CM

## 2015-09-22 DIAGNOSIS — I10 Essential (primary) hypertension: Secondary | ICD-10-CM | POA: Diagnosis present

## 2015-09-22 DIAGNOSIS — Z981 Arthrodesis status: Secondary | ICD-10-CM | POA: Diagnosis not present

## 2015-09-22 DIAGNOSIS — Z8249 Family history of ischemic heart disease and other diseases of the circulatory system: Secondary | ICD-10-CM

## 2015-09-22 DIAGNOSIS — G8929 Other chronic pain: Secondary | ICD-10-CM | POA: Diagnosis present

## 2015-09-22 HISTORY — PX: TOTAL HIP ARTHROPLASTY: SHX124

## 2015-09-22 SURGERY — ARTHROPLASTY, HIP, TOTAL, ANTERIOR APPROACH
Anesthesia: General | Site: Hip | Laterality: Right

## 2015-09-22 MED ORDER — SODIUM CHLORIDE 0.9 % IV SOLN
INTRAVENOUS | Status: DC | PRN
  Administered 2015-09-22: 2000 mg via TOPICAL

## 2015-09-22 MED ORDER — ONDANSETRON HCL 4 MG/2ML IJ SOLN
4.0000 mg | Freq: Four times a day (QID) | INTRAMUSCULAR | Status: DC | PRN
  Administered 2015-09-22: 4 mg via INTRAVENOUS
  Filled 2015-09-22: qty 2

## 2015-09-22 MED ORDER — DEXAMETHASONE SODIUM PHOSPHATE 10 MG/ML IJ SOLN
INTRAMUSCULAR | Status: DC | PRN
  Administered 2015-09-22: 10 mg via INTRAVENOUS

## 2015-09-22 MED ORDER — MONTELUKAST SODIUM 10 MG PO TABS
10.0000 mg | ORAL_TABLET | Freq: Every day | ORAL | Status: DC
  Administered 2015-09-22 – 2015-09-23 (×2): 10 mg via ORAL
  Filled 2015-09-22 (×2): qty 1

## 2015-09-22 MED ORDER — PANTOPRAZOLE SODIUM 40 MG PO TBEC
80.0000 mg | DELAYED_RELEASE_TABLET | Freq: Every day | ORAL | Status: DC
  Administered 2015-09-23 – 2015-09-24 (×2): 80 mg via ORAL
  Filled 2015-09-22 (×2): qty 2

## 2015-09-22 MED ORDER — BUPIVACAINE LIPOSOME 1.3 % IJ SUSP
20.0000 mL | INTRAMUSCULAR | Status: DC
  Filled 2015-09-22: qty 20

## 2015-09-22 MED ORDER — ALUM & MAG HYDROXIDE-SIMETH 200-200-20 MG/5ML PO SUSP: 30.0000 mL | ORAL | Status: DC | PRN

## 2015-09-22 MED ORDER — METOCLOPRAMIDE HCL 5 MG PO TABS: 5.0000 mg | ORAL_TABLET | Freq: Three times a day (TID) | ORAL | Status: DC | PRN

## 2015-09-22 MED ORDER — ONDANSETRON HCL 4 MG PO TABS
4.0000 mg | ORAL_TABLET | Freq: Four times a day (QID) | ORAL | Status: DC | PRN
  Filled 2015-09-22: qty 1

## 2015-09-22 MED ORDER — DEXTROSE 5 % IV SOLN
500.0000 mg | Freq: Four times a day (QID) | INTRAVENOUS | Status: DC | PRN
  Administered 2015-09-22: 500 mg via INTRAVENOUS
  Filled 2015-09-22 (×2): qty 5

## 2015-09-22 MED ORDER — OXYCODONE HCL 5 MG PO TABS
10.0000 mg | ORAL_TABLET | ORAL | Status: DC | PRN
  Administered 2015-09-22 – 2015-09-23 (×4): 10 mg via ORAL
  Administered 2015-09-23 – 2015-09-24 (×2): 15 mg via ORAL
  Administered 2015-09-24: 20 mg via ORAL
  Filled 2015-09-22: qty 3
  Filled 2015-09-22: qty 2
  Filled 2015-09-22: qty 4
  Filled 2015-09-22 (×2): qty 3
  Filled 2015-09-22 (×3): qty 2

## 2015-09-22 MED ORDER — BUPIVACAINE-EPINEPHRINE 0.25% -1:200000 IJ SOLN
INTRAMUSCULAR | Status: DC | PRN
  Administered 2015-09-22: 20 mL

## 2015-09-22 MED ORDER — PHENYLEPHRINE HCL 10 MG/ML IJ SOLN
INTRAMUSCULAR | Status: DC | PRN
  Administered 2015-09-22 (×2): 120 ug via INTRAVENOUS
  Administered 2015-09-22: 160 ug via INTRAVENOUS
  Administered 2015-09-22: 120 ug via INTRAVENOUS

## 2015-09-22 MED ORDER — ROCURONIUM BROMIDE 50 MG/5ML IV SOLN
INTRAVENOUS | Status: AC
  Filled 2015-09-22: qty 1

## 2015-09-22 MED ORDER — ROCURONIUM BROMIDE 100 MG/10ML IV SOLN
INTRAVENOUS | Status: DC | PRN
  Administered 2015-09-22: 30 mg via INTRAVENOUS
  Administered 2015-09-22: 50 mg via INTRAVENOUS
  Administered 2015-09-22: 20 mg via INTRAVENOUS

## 2015-09-22 MED ORDER — ALBUMIN HUMAN 5 % IV SOLN
INTRAVENOUS | Status: DC | PRN
  Administered 2015-09-22: 09:00:00 via INTRAVENOUS

## 2015-09-22 MED ORDER — FENTANYL CITRATE (PF) 100 MCG/2ML IJ SOLN
25.0000 ug | INTRAMUSCULAR | Status: DC | PRN
  Administered 2015-09-22 (×4): 25 ug via INTRAVENOUS

## 2015-09-22 MED ORDER — FENTANYL CITRATE (PF) 100 MCG/2ML IJ SOLN
INTRAMUSCULAR | Status: AC
  Filled 2015-09-22: qty 2

## 2015-09-22 MED ORDER — HYDROMORPHONE HCL 1 MG/ML IJ SOLN
0.5000 mg | INTRAMUSCULAR | Status: DC | PRN
  Administered 2015-09-22 (×3): 1 mg via INTRAVENOUS
  Filled 2015-09-22 (×3): qty 1

## 2015-09-22 MED ORDER — DEXAMETHASONE SODIUM PHOSPHATE 10 MG/ML IJ SOLN
INTRAMUSCULAR | Status: AC
  Filled 2015-09-22: qty 1

## 2015-09-22 MED ORDER — METHOCARBAMOL 500 MG PO TABS
500.0000 mg | ORAL_TABLET | Freq: Four times a day (QID) | ORAL | Status: DC | PRN
  Administered 2015-09-22 – 2015-09-24 (×6): 500 mg via ORAL
  Filled 2015-09-22 (×8): qty 1

## 2015-09-22 MED ORDER — PROPOFOL 10 MG/ML IV BOLUS
INTRAVENOUS | Status: AC
  Filled 2015-09-22: qty 20

## 2015-09-22 MED ORDER — METOCLOPRAMIDE HCL 5 MG/ML IJ SOLN: 5.0000 mg | Freq: Three times a day (TID) | INTRAMUSCULAR | Status: DC | PRN

## 2015-09-22 MED ORDER — TRANEXAMIC ACID 1000 MG/10ML IV SOLN
1000.0000 mg | INTRAVENOUS | Status: AC
  Administered 2015-09-22: 1000 mg via INTRAVENOUS
  Filled 2015-09-22: qty 10

## 2015-09-22 MED ORDER — BISACODYL 5 MG PO TBEC: 5.0000 mg | DELAYED_RELEASE_TABLET | Freq: Every day | ORAL | Status: DC | PRN

## 2015-09-22 MED ORDER — CEFAZOLIN SODIUM-DEXTROSE 2-4 GM/100ML-% IV SOLN
2.0000 g | Freq: Four times a day (QID) | INTRAVENOUS | Status: AC
  Administered 2015-09-22 (×2): 2 g via INTRAVENOUS
  Filled 2015-09-22 (×2): qty 100

## 2015-09-22 MED ORDER — CEFAZOLIN SODIUM-DEXTROSE 2-4 GM/100ML-% IV SOLN
INTRAVENOUS | Status: AC
  Filled 2015-09-22: qty 100

## 2015-09-22 MED ORDER — PROPOFOL 10 MG/ML IV BOLUS
INTRAVENOUS | Status: DC | PRN
  Administered 2015-09-22: 200 mg via INTRAVENOUS

## 2015-09-22 MED ORDER — LISINOPRIL 20 MG PO TABS
20.0000 mg | ORAL_TABLET | Freq: Every day | ORAL | Status: DC
  Administered 2015-09-24: 20 mg via ORAL
  Filled 2015-09-22 (×2): qty 1

## 2015-09-22 MED ORDER — DOCUSATE SODIUM 100 MG PO CAPS
100.0000 mg | ORAL_CAPSULE | Freq: Two times a day (BID) | ORAL | Status: DC
  Administered 2015-09-22 – 2015-09-24 (×5): 100 mg via ORAL
  Filled 2015-09-22 (×5): qty 1

## 2015-09-22 MED ORDER — FENTANYL CITRATE (PF) 100 MCG/2ML IJ SOLN
INTRAMUSCULAR | Status: DC | PRN
  Administered 2015-09-22 (×8): 50 ug via INTRAVENOUS

## 2015-09-22 MED ORDER — DIPHENHYDRAMINE HCL 12.5 MG/5ML PO ELIX: 12.5000 mg | ORAL_SOLUTION | ORAL | Status: DC | PRN

## 2015-09-22 MED ORDER — ACETAMINOPHEN 650 MG RE SUPP
650.0000 mg | Freq: Four times a day (QID) | RECTAL | Status: DC | PRN
Start: 2015-09-22 — End: 2015-09-24

## 2015-09-22 MED ORDER — LIDOCAINE HCL (CARDIAC) 20 MG/ML IV SOLN
INTRAVENOUS | Status: DC | PRN
  Administered 2015-09-22: 100 mg via INTRAVENOUS

## 2015-09-22 MED ORDER — ASPIRIN EC 325 MG PO TBEC
325.0000 mg | DELAYED_RELEASE_TABLET | Freq: Two times a day (BID) | ORAL | Status: DC
  Administered 2015-09-23 – 2015-09-24 (×3): 325 mg via ORAL
  Filled 2015-09-22 (×3): qty 1

## 2015-09-22 MED ORDER — SUGAMMADEX SODIUM 500 MG/5ML IV SOLN
INTRAVENOUS | Status: AC
  Filled 2015-09-22: qty 5

## 2015-09-22 MED ORDER — 0.9 % SODIUM CHLORIDE (POUR BTL) OPTIME
TOPICAL | Status: DC | PRN
  Administered 2015-09-22: 1000 mL

## 2015-09-22 MED ORDER — ONDANSETRON HCL 4 MG/2ML IJ SOLN
INTRAMUSCULAR | Status: AC
  Filled 2015-09-22: qty 2

## 2015-09-22 MED ORDER — AMLODIPINE BESYLATE 10 MG PO TABS
10.0000 mg | ORAL_TABLET | Freq: Every day | ORAL | Status: DC
  Administered 2015-09-24: 10 mg via ORAL
  Filled 2015-09-22 (×2): qty 1

## 2015-09-22 MED ORDER — BUPIVACAINE-EPINEPHRINE (PF) 0.25% -1:200000 IJ SOLN
INTRAMUSCULAR | Status: AC
  Filled 2015-09-22: qty 30

## 2015-09-22 MED ORDER — MENTHOL 3 MG MT LOZG
1.0000 | LOZENGE | OROMUCOSAL | Status: DC | PRN
Start: 2015-09-22 — End: 2015-09-24

## 2015-09-22 MED ORDER — SUGAMMADEX SODIUM 500 MG/5ML IV SOLN
INTRAVENOUS | Status: DC | PRN
  Administered 2015-09-22: 250 mg via INTRAVENOUS

## 2015-09-22 MED ORDER — ACETAMINOPHEN 325 MG PO TABS: 650.0000 mg | ORAL_TABLET | Freq: Four times a day (QID) | ORAL | Status: DC | PRN

## 2015-09-22 MED ORDER — LACTATED RINGERS IV SOLN
INTRAVENOUS | Status: DC
  Administered 2015-09-23: 07:00:00 via INTRAVENOUS

## 2015-09-22 MED ORDER — MIDAZOLAM HCL 5 MG/5ML IJ SOLN
INTRAMUSCULAR | Status: DC | PRN
  Administered 2015-09-22: 2 mg via INTRAVENOUS

## 2015-09-22 MED ORDER — TRANEXAMIC ACID 1000 MG/10ML IV SOLN
1000.0000 mg | Freq: Once | INTRAVENOUS | Status: DC
  Filled 2015-09-22: qty 10

## 2015-09-22 MED ORDER — SUCCINYLCHOLINE CHLORIDE 200 MG/10ML IV SOSY
PREFILLED_SYRINGE | INTRAVENOUS | Status: AC
  Filled 2015-09-22: qty 10

## 2015-09-22 MED ORDER — GABAPENTIN 400 MG PO CAPS
800.0000 mg | ORAL_CAPSULE | Freq: Every day | ORAL | Status: DC
  Administered 2015-09-22 – 2015-09-24 (×3): 800 mg via ORAL
  Filled 2015-09-22 (×3): qty 2

## 2015-09-22 MED ORDER — FENTANYL CITRATE (PF) 250 MCG/5ML IJ SOLN
INTRAMUSCULAR | Status: AC
  Filled 2015-09-22: qty 5

## 2015-09-22 MED ORDER — LIDOCAINE 2% (20 MG/ML) 5 ML SYRINGE
INTRAMUSCULAR | Status: AC
  Filled 2015-09-22: qty 5

## 2015-09-22 MED ORDER — PHENOL 1.4 % MT LIQD: 1.0000 | OROMUCOSAL | Status: DC | PRN

## 2015-09-22 MED ORDER — PHENYLEPHRINE HCL 10 MG/ML IJ SOLN
INTRAVENOUS | Status: DC | PRN
  Administered 2015-09-22: 30 ug/min via INTRAVENOUS

## 2015-09-22 MED ORDER — TRANEXAMIC ACID 1000 MG/10ML IV SOLN
2000.0000 mg | INTRAVENOUS | Status: DC
  Filled 2015-09-22: qty 20

## 2015-09-22 MED ORDER — LACTATED RINGERS IV SOLN
INTRAVENOUS | Status: DC
  Administered 2015-09-22 (×3): via INTRAVENOUS

## 2015-09-22 MED ORDER — CHLORHEXIDINE GLUCONATE 4 % EX LIQD: 60.0000 mL | Freq: Once | CUTANEOUS | Status: DC

## 2015-09-22 MED ORDER — PHENYLEPHRINE 40 MCG/ML (10ML) SYRINGE FOR IV PUSH (FOR BLOOD PRESSURE SUPPORT)
PREFILLED_SYRINGE | INTRAVENOUS | Status: AC
  Filled 2015-09-22: qty 10

## 2015-09-22 MED ORDER — ONDANSETRON HCL 4 MG/2ML IJ SOLN
INTRAMUSCULAR | Status: DC | PRN
  Administered 2015-09-22: 4 mg via INTRAVENOUS

## 2015-09-22 MED ORDER — MIDAZOLAM HCL 2 MG/2ML IJ SOLN
INTRAMUSCULAR | Status: AC
  Filled 2015-09-22: qty 2

## 2015-09-22 MED ORDER — CEFAZOLIN SODIUM-DEXTROSE 2-4 GM/100ML-% IV SOLN
2.0000 g | INTRAVENOUS | Status: AC
  Administered 2015-09-22: 2 g via INTRAVENOUS

## 2015-09-22 MED ORDER — BUPIVACAINE LIPOSOME 1.3 % IJ SUSP
INTRAMUSCULAR | Status: DC | PRN
  Administered 2015-09-22: 20 mL

## 2015-09-22 SURGICAL SUPPLY — 50 items
BLADE SAW SGTL 18X1.27X75 (BLADE) ×2 IMPLANT
BLADE SURG ROTATE 9660 (MISCELLANEOUS) IMPLANT
CAPT HIP TOTAL 2 ×2 IMPLANT
CATH FOLEY 2WAY SLVR 30CC 16FR (CATHETERS) ×2 IMPLANT
CELLS DAT CNTRL 66122 CELL SVR (MISCELLANEOUS) ×1 IMPLANT
COVER PERINEAL POST (MISCELLANEOUS) ×2 IMPLANT
COVER SURGICAL LIGHT HANDLE (MISCELLANEOUS) ×2 IMPLANT
DRAPE C-ARM 42X72 X-RAY (DRAPES) ×2 IMPLANT
DRAPE IMP U-DRAPE 54X76 (DRAPES) ×2 IMPLANT
DRAPE STERI IOBAN 125X83 (DRAPES) ×2 IMPLANT
DRAPE U-SHAPE 47X51 STRL (DRAPES) ×6 IMPLANT
DRSG AQUACEL AG ADV 3.5X10 (GAUZE/BANDAGES/DRESSINGS) ×2 IMPLANT
DURAPREP 26ML APPLICATOR (WOUND CARE) ×2 IMPLANT
ELECT BLADE 4.0 EZ CLEAN MEGAD (MISCELLANEOUS)
ELECT CAUTERY BLADE 6.4 (BLADE) ×2 IMPLANT
ELECT REM PT RETURN 9FT ADLT (ELECTROSURGICAL) ×2
ELECTRODE BLDE 4.0 EZ CLN MEGD (MISCELLANEOUS) IMPLANT
ELECTRODE REM PT RTRN 9FT ADLT (ELECTROSURGICAL) ×1 IMPLANT
FACESHIELD WRAPAROUND (MASK) ×4 IMPLANT
GLOVE BIO SURGEON STRL SZ8 (GLOVE) ×4 IMPLANT
GLOVE BIOGEL PI IND STRL 8 (GLOVE) ×2 IMPLANT
GLOVE BIOGEL PI INDICATOR 8 (GLOVE) ×2
GOWN STRL REUS W/ TWL LRG LVL3 (GOWN DISPOSABLE) ×1 IMPLANT
GOWN STRL REUS W/ TWL XL LVL3 (GOWN DISPOSABLE) ×2 IMPLANT
GOWN STRL REUS W/TWL LRG LVL3 (GOWN DISPOSABLE) ×1
GOWN STRL REUS W/TWL XL LVL3 (GOWN DISPOSABLE) ×2
HOOD SURGICAL BLUE (PROTECTIVE WEAR) ×2 IMPLANT
KIT BASIN OR (CUSTOM PROCEDURE TRAY) ×2 IMPLANT
KIT ROOM TURNOVER OR (KITS) ×2 IMPLANT
MANIFOLD NEPTUNE II (INSTRUMENTS) ×2 IMPLANT
NEEDLE HYPO 22GX1.5 SAFETY (NEEDLE) ×2 IMPLANT
NS IRRIG 1000ML POUR BTL (IV SOLUTION) ×2 IMPLANT
PACK TOTAL JOINT (CUSTOM PROCEDURE TRAY) ×2 IMPLANT
PAD ARMBOARD 7.5X6 YLW CONV (MISCELLANEOUS) ×2 IMPLANT
RTRCTR WOUND ALEXIS 18CM MED (MISCELLANEOUS) ×2
STAPLER VISISTAT 35W (STAPLE) ×2 IMPLANT
STRIP CLOSURE SKIN 1/2X4 (GAUZE/BANDAGES/DRESSINGS) ×2 IMPLANT
SUT ETHIBOND NAB CT1 #1 30IN (SUTURE) ×6 IMPLANT
SUT VIC AB 0 CT1 27 (SUTURE) ×1
SUT VIC AB 0 CT1 27XBRD ANBCTR (SUTURE) ×1 IMPLANT
SUT VIC AB 1 CT1 27 (SUTURE) ×1
SUT VIC AB 1 CT1 27XBRD ANBCTR (SUTURE) ×1 IMPLANT
SUT VIC AB 2-0 CT1 27 (SUTURE) ×1
SUT VIC AB 2-0 CT1 TAPERPNT 27 (SUTURE) ×1 IMPLANT
SUT VLOC 180 0 24IN GS25 (SUTURE) ×2 IMPLANT
SYR 50ML LL SCALE MARK (SYRINGE) ×2 IMPLANT
TOWEL OR 17X24 6PK STRL BLUE (TOWEL DISPOSABLE) ×2 IMPLANT
TOWEL OR 17X26 10 PK STRL BLUE (TOWEL DISPOSABLE) ×4 IMPLANT
TRAY FOLEY CATH 14FR (SET/KITS/TRAYS/PACK) IMPLANT
WATER STERILE IRR 1000ML POUR (IV SOLUTION) IMPLANT

## 2015-09-22 NOTE — Anesthesia Procedure Notes (Signed)
Procedure Name: Intubation Date/Time: 09/22/2015 7:33 AM Performed by: Rosiland Oz Pre-anesthesia Checklist: Patient identified, Emergency Drugs available, Suction available, Patient being monitored and Timeout performed Patient Re-evaluated:Patient Re-evaluated prior to inductionOxygen Delivery Method: Circle system utilized Preoxygenation: Pre-oxygenation with 100% oxygen Intubation Type: IV induction Ventilation: Mask ventilation without difficulty Laryngoscope Size: Miller and 2 Grade View: Grade I Tube type: Oral Tube size: 7.0 mm Number of attempts: 1 Airway Equipment and Method: Stylet Placement Confirmation: ETT inserted through vocal cords under direct vision,  breath sounds checked- equal and bilateral and positive ETCO2 Secured at: 21 cm Tube secured with: Tape Dental Injury: Teeth and Oropharynx as per pre-operative assessment

## 2015-09-22 NOTE — Op Note (Signed)
PRE-OP DIAGNOSIS:  RIGHT HIP DEGENERATIVE JOINT DISEASE POST-OP DIAGNOSIS:  same PROCEDURE: RIGHT TOTAL HIP ARTHROPLASTY ANTERIOR APPROACH ANESTHESIA:  General SURGEON:  Marcene Corning MD ASSISTANT:  Elodia Florence PA-C   INDICATIONS FOR PROCEDURE:  The patient is a 59 y.o. male with a long history of a painful hip.  This has persisted despite multiple conservative measures.  The patient has persisted with pain and dysfunction making rest and activity difficult.  A total hip replacement is offered as surgical treatment.  Informed operative consent was obtained after discussion of possible complications including reaction to anesthesia, infection, neurovascular injury, dislocation, DVT, PE, and death.  The importance of the postoperative rehab program to optimize result was stressed with the patient.  SUMMARY OF FINDINGS AND PROCEDURE:  Under general anesthesia through a anterior approach an the Hana table a right THR was performed.  The patient had severe degenerative change and excellent bone quality.  We used DePuy components to replace the hip and these were size KA 14 Corail femur capped with a +1.5 37mm ceramic hip ball.  On the acetabular side we used a size 52 Gription shell with a  plus 0 neutral polyethylene liner.  We did use a hole eliminator.  Elodia Florence PA-C assisted throughout and was invaluable to the completion of the case in that he helped position and retract while I performed the procedure.  He also closed simultaneously to help minimize OR time.  I used fluoroscopy throughout the case to check position of implants and leg lengths and read all of these views myself.  DESCRIPTION OF PROCEDURE:  The patient was taken to the OR suite where general anesthetic was applied.  The patient was then positioned on the Hana table supine.  All bony prominences were appropriately padded.  Prep and drape was then performed in normal sterile fashion.  The patient was given kefzol preoperative antibiotic  and an appropriate time out was performed.  We then took an anterior approach to the right hip.  Dissection was taken through adipose to the tensor fascia lata fascia.  This structure was incised longitudinally and we dissected in the intermuscular interval just medial to this muscle.  Cobra retractors were placed superior and inferior to the femoral neck superficial to the capsule.  A capsular incision was then made and the retractors were placed along the femoral neck.  Xray was brought in to get a good level for the femoral neck cut which was made with an oscillating saw and osteotome.  The femoral head was removed with a corkscrew.  The acetabulum was exposed and some labral tissues were excised. Reaming was taken to the inside wall of the pelvis and sequentially up to 1 mm smaller than the actual component.  A trial of components was done and then the aforementioned acetabular shell was placed in appropriate tilt and anteversion confirmed by fluoroscopy. The liner was placed along with the hole eliminator and attention was turned to the femur.  The leg was brought down and over into adduction and the elevator bar was used to raise the femur up gently in the wound.  The piriformis was released with care taken to preserve the obturator internus attachment and all of the posterior capsule. The femur was reamed and then broached to the appropriate size.  A trial reduction was done and the aforementioned head and neck assembly gave [place] the best stability in extension with external rotation.  Leg lengths were felt to be about equal by fluoroscopic exam.  The trial components were removed and the wound irrigated.  We then placed the femoral component in appropriate anteversion.  The head was applied to a dry stem neck and the hip again reduced.  It was again stable in the aforementioned position.  The would was irrigated again followed by re-approximation of anterior capsule with ethibond suture. Tensor fascia was  repaired with V-loc suture  followed by subcutaneous closure with #O and #2 undyed vicryl.  Skin was closed with subQ stitch and steristrips followed by a sterile dressing.  EBL and IOF can be obtained from anesthesia records.  DISPOSITION:  The patient was extubated in the OR and taken to PACU in stable condition to be admitted to the Orthopedic Surgery for appropriate post-op care to include perioperative antibiotics and DVT prophylaxis.

## 2015-09-22 NOTE — Clinical Social Work Note (Signed)
CSW acknowledges SNF consult. PT recommending HHPT.  CSW signing off. Consult again if any social work needs arise.  Lizza Huffaker, CSW [phone]  

## 2015-09-22 NOTE — Evaluation (Signed)
Physical Therapy Evaluation Patient Details Name: John Andrade MRN: [id number] DOB: May 20, 1955 Today's Date: 09/22/2015   History of Present Illness  60 y.o. male now s/p Rt direct anterior THA on 09/22/15. PMH: hypertension, lumbar fusion.   Clinical Impression  Pt is s/p rt direct anterior THA resulting in the deficits listed below (see PT Problem List).  Pt will benefit from skilled PT to increase their independence and safety with mobility to allow discharge to home with family support.       Follow Up Recommendations Home health PT;Supervision for mobility/OOB    Equipment Recommendations  Rolling walker with 5" wheels;3in1 (PT)    Recommendations for Other Services       Precautions / Restrictions Precautions Precautions: Fall Restrictions Weight Bearing Restrictions: Yes RLE Weight Bearing: Weight bearing as tolerated      Mobility  Bed Mobility Overal bed mobility: Needs Assistance Bed Mobility: Supine to Sit     Supine to sit: HOB elevated;Min assist     General bed mobility comments: assist provided at Rt LE as needed and at trunk to come fully to sitting.   Transfers Overall transfer level: Needs assistance Equipment used: Rolling walker (2 wheeled) Transfers: Sit to/from Stand Sit to Stand: Min assist         General transfer comment: cues for hand placement  Ambulation/Gait Ambulation/Gait assistance: Min guard Ambulation Distance (Feet): 5 Feet Assistive device: Rolling walker (2 wheeled) Gait Pattern/deviations: Step-to pattern;Decreased stance time - right Gait velocity: decreased   General Gait Details: cues for gait sequence, difficulty advancing Rt LE with swing phase  Stairs            Wheelchair Mobility    Modified Rankin (Stroke Patients Only)       Balance Overall balance assessment: Needs assistance Sitting-balance support: No upper extremity supported Sitting balance-Leahy Scale: Good     Standing balance support:  Bilateral upper extremity supported Standing balance-Leahy Scale: Poor Standing balance comment: using rw                             Pertinent Vitals/Pain Pain Assessment: Faces Faces Pain Scale: Hurts little more Pain Location: Rt hip Pain Descriptors / Indicators: Grimacing;Guarding Pain Intervention(s): Limited activity within patient's tolerance;Monitored during session    Home Living Family/patient expects to be discharged to:: Private residence Living Arrangements: Spouse/significant other;Children Available Help at Discharge: Family;Available 24 hours/day Type of Home: House Home Access: Stairs to enter Entrance Stairs-Rails: Right (Rt from garage, both at front) Entrance Stairs-Number of Steps: 3 (3 from garage, 4-5 at front door) Home Layout: One level Home Equipment: Walker - 4 wheels;Cane - single point      Prior Function Level of Independence: Independent with assistive device(s)         Comments: used SPC for ambulation     Hand Dominance        Extremity/Trunk Assessment   Upper Extremity Assessment: RUE deficits/detail RUE Deficits / Details: reports having Rt rotator cuff tear         Lower Extremity Assessment: RLE deficits/detail RLE Deficits / Details: limited active motion due to reports of pain       Communication   Communication: No difficulties  Cognition Arousal/Alertness: Awake/alert Behavior During Therapy: WFL for tasks assessed/performed Overall Cognitive Status: Within Functional Limits for tasks assessed  General Comments      Exercises        Assessment/Plan    PT Assessment Patient needs continued PT services  PT Diagnosis Difficulty walking   PT Problem List Decreased strength;Decreased range of motion;Decreased activity tolerance;Decreased balance;Decreased mobility  PT Treatment Interventions     PT Goals (Current goals can be found in the Care Plan section) Acute  Rehab PT Goals Patient Stated Goal: get back home and active again PT Goal Formulation: With patient Time For Goal Achievement: 10/06/15 Potential to Achieve Goals: Good    Frequency 7X/week   Barriers to discharge        Co-evaluation               End of Session Equipment Utilized During Treatment: Gait belt Activity Tolerance: Patient tolerated treatment well Patient left: in chair;with call bell/phone within reach;with family/visitor present Nurse Communication: Weight bearing status;Mobility status         Time: 1610-9604 PT Time Calculation (min) (ACUTE ONLY): 38 min   Charges:   PT Evaluation $PT Eval Moderate Complexity: 1 Procedure PT Treatments $Therapeutic Activity: 23-37 mins   PT G Codes:        Christiane Ha, PT, CSCS Pager [phone] Office [phone]  09/22/2015, 4:16 PM

## 2015-09-22 NOTE — Interval H&P Note (Signed)
History and Physical Interval Note:  09/22/2015 7:15 AM  John Andrade  has presented today for surgery, with the diagnosis of RIGHT HIP DEGENERATIVE JOINT DISEASE  The various methods of treatment have been discussed with the patient and family. After consideration of risks, benefits and other options for treatment, the patient has consented to  Procedure(s): TOTAL HIP ARTHROPLASTY ANTERIOR APPROACH (Right) as a surgical intervention .  The patient's history has been reviewed, patient examined, no change in status, stable for surgery.  I have reviewed the patient's chart and labs.  Questions were answered to the patient's satisfaction.     Nia Nathaniel G

## 2015-09-22 NOTE — Transfer of Care (Signed)
Immediate Anesthesia Transfer of Care Note  Patient: John Andrade  Procedure(s) Performed: Procedure(s): TOTAL HIP ARTHROPLASTY ANTERIOR APPROACH (Right)  Patient Location: PACU  Anesthesia Type:General  Level of Consciousness: awake, alert , oriented and patient cooperative  Airway & Oxygen Therapy: Patient Spontanous Breathing  Post-op Assessment: Report given to RN and Post -op Vital signs reviewed and stable  Post vital signs: Reviewed and stable  Last Vitals:  Vitals:   09/22/15 0554 09/22/15 0936  BP: 127/72   Pulse: 69   Resp: 20   Temp: 36.8 C (P) 36.9 C    Last Pain:  Vitals:   09/22/15 0554  TempSrc: Oral         Complications: No apparent anesthesia complications

## 2015-09-23 ENCOUNTER — Encounter (HOSPITAL_COMMUNITY): Payer: Self-pay | Admitting: Orthopaedic Surgery

## 2015-09-23 NOTE — Progress Notes (Signed)
Subjective: 1 Day Post-Op Procedure(s) (LRB): TOTAL HIP ARTHROPLASTY ANTERIOR APPROACH (Right)  Activity level:  wbat Diet tolerance:  ok Voiding:  Foley out this morning Patient reports pain as mild.    Objective: Vital signs in last 24 hours: Temp:  [98 F (36.7 C)-99.5 F (37.5 C)] 98 F (36.7 C) (08/02 0447) Pulse Rate:  [61-100] 65 (08/02 0447) Resp:  [10-22] 22 (08/01 1102) BP: (86-122)/(48-75) 122/75 (08/02 0447) SpO2:  [97 %-100 %] 100 % (08/02 0447)  Labs: No results for input(s): HGB in the last 72 hours. No results for input(s): WBC, RBC, HCT, PLT in the last 72 hours. No results for input(s): NA, K, CL, CO2, BUN, CREATININE, GLUCOSE, CALCIUM in the last 72 hours. No results for input(s): LABPT, INR in the last 72 hours.  Physical Exam:  Neurologically intact ABD soft Neurovascular intact Sensation intact distally Intact pulses distally Dorsiflexion/Plantar flexion intact Incision: dressing C/D/I and no drainage No cellulitis present Compartment soft  Assessment/Plan:  1 Day Post-Op Procedure(s) (LRB): TOTAL HIP ARTHROPLASTY ANTERIOR APPROACH (Right) Advance diet Up with therapy D/C IV fluids Plan for discharge tomorrow Discharge home with home health if doing well and cleared by PT. Follow up in office 2 weeks post op. Continue on ASA 325mg  BID x 4 weeks post op.  Jru Pense, CHARMING ANTONOVICH 09/23/2015, 7:30 AM

## 2015-09-23 NOTE — Evaluation (Signed)
Occupational Therapy Evaluation Patient Details Name: John Andrade MRN: [id number] DOB: [birth year] Today's Date: 09/23/2015    History of Present Illness 60 y.o. male now s/p Rt direct anterior THA on 09/22/15. PMH: hypertension, lumbar fusion.    Clinical Impression   Pt was ambulating with a cane and assisted for LB bathing and dressing prior to admission. Pt works as a Education officer, environmental. Pt educated in use of reacher and compensatory strategies for LB bathing and dressing, use of 3 in 1 to elevate toilet and use of tub transfer bench pt will borrow from a friend. Pt requiring min guard assist for OOB mobility with RW. Pt has good family support at home. No further OT needs.    Follow Up Recommendations  No OT follow up    Equipment Recommendations  3 in 1 bedside comode (extra wide)    Recommendations for Other Services       Precautions / Restrictions Precautions Precautions: Fall Restrictions Weight Bearing Restrictions: Yes RLE Weight Bearing: Weight bearing as tolerated      Mobility Bed Mobility Overal bed mobility: Needs Assistance Bed Mobility: Supine to Sit     Supine to sit: HOB elevated;Min assist     General bed mobility comments: assist as needed to guide R LE to EOB  Transfers Overall transfer level: Needs assistance Equipment used: Rolling walker (2 wheeled) Transfers: Sit to/from [employer] Sit to Stand: Min guard Stand pivot transfers: Min guard       General transfer comment: cues for hand placement    Balance     Sitting balance-Leahy Scale: Good       Standing balance-Leahy Scale: Poor                              ADL Overall ADL's : Needs assistance/impaired Eating/Feeding: Independent;Sitting   Grooming: Wash/dry hands;Wash/dry face;Sitting;Set up   Upper Body Bathing: Set up;Sitting   Lower Body Bathing: Sit to/from stand;Moderate assistance   Upper Body Dressing : Set up;Sitting   Lower Body Dressing:  Moderate assistance;Sit to/from stand Lower Body Dressing Details (indicate cue type and reason): instructed in use of reacher and to dress operated leg first and undress it last Toilet Transfer: Supervision/safety;Stand-pivot;RW Statistician Details (indicate cue type and reason): simulated to chair Toileting- Clothing Manipulation and Hygiene: Supervision/safety;Sit to/from Nurse, children's Details (indicate cue type and reason): pt plans to sponge bathe, will borrow tub bench from church friend when ready to shower   General ADL Comments: Pt will continue to rely on wife for LB ADL, no interest in AE.     Vision     Perception     Praxis      Pertinent Vitals/Pain Pain Assessment: 0-10 Pain Score: 5  Pain Location: R hip Pain Descriptors / Indicators: Sore;Grimacing;Guarding Pain Intervention(s): Repositioned;Ice applied;RN gave pain meds during session;Monitored during session     Hand Dominance Right   Extremity/Trunk Assessment Upper Extremity Assessment Upper Extremity Assessment: Overall WFL for tasks assessed RUE Deficits / Details: reports having Rt rotator cuff tear   Lower Extremity Assessment Lower Extremity Assessment: Defer to PT evaluation       Communication Communication Communication: No difficulties   Cognition Arousal/Alertness: Awake/alert Behavior During Therapy: WFL for tasks assessed/performed Overall Cognitive Status: Within Functional Limits for tasks assessed  General Comments       Exercises       Shoulder Instructions      Home Living Family/patient expects to be discharged to:: Private residence Living Arrangements: Spouse/significant other;Children Available Help at Discharge: Family;Available 24 hours/day Type of Home: House Home Access: Stairs to enter [employer] of Steps: 3 Entrance Stairs-Rails: Right Home Layout: One level     Bathroom Shower/Tub: Tub/shower  unit Shower/tub characteristics: Curtain Firefighter: Standard     Home Equipment: Environmental consultant - 4 wheels;Cane - single point;Adaptive equipment Adaptive Equipment: Reacher Additional Comments: pt has access to a tub bench from a church friend      Prior Functioning/Environment Level of Independence: Needs assistance  Gait / Transfers Assistance Needed: used a cane ADL's / Homemaking Assistance Needed: assisted for LB bathing and dressing        OT Diagnosis: Generalized weakness;Acute pain   OT Problem List: Decreased activity tolerance;Decreased strength;Impaired balance (sitting and/or standing);Decreased knowledge of use of DME or AE;Obesity;Pain   OT Treatment/Interventions:      OT Goals(Current goals can be found in the care plan section) Acute Rehab OT Goals Patient Stated Goal: have L hip replaced in the coming weeks  OT Frequency:     Barriers to D/C:            Co-evaluation              End of Session Equipment Utilized During Treatment: Rolling walker;Gait belt  Activity Tolerance: Patient tolerated treatment well Patient left: in chair;with call bell/phone within reach;with family/visitor present   Time: 2956-2130 OT Time Calculation (min): 26 min Charges:  OT General Charges $OT Visit: 1 Procedure OT Evaluation $OT Eval Low Complexity: 1 Procedure OT Treatments $Self Care/Home Management : 8-22 mins G-Codes:    Evern Bio 09/23/2015, 11:10 AM  [phone]

## 2015-09-23 NOTE — Care Management Note (Signed)
Case Management Note  Patient Details  Name: John Andrade MRN: [id number] Date of Birth: [birth year]  Subjective/Objective:    60 yr old gentleman s/p right total hip arthroplasty.                 Action/Plan: Case manager spoke with patient and wife concerning home health and DME . Patient was preoperatively setup with [facility], no changes. Patient states he is borrowing a walker from his sister. CM has ordered a wide 3in1. Patient will have family support at discharge.   Expected Discharge Date:    09/24/15              Expected Discharge Plan:  Home w Home Health Services  In-House Referral:     Discharge planning Services  CM Consult  Post Acute Care Choice:  Durable Medical Equipment, Home Health Choice offered to:  Patient  DME Arranged:  3-N-1 (will need wide 3in1) DME Agency:  [facility]  HH Arranged:    HH Agency:  [facility]  Status of Service:  Completed, signed off  If discussed at Long Length of Stay Meetings, dates discussed:    Additional Comments:  Durenda Guthrie, RN 09/23/2015, 1:09 PM

## 2015-09-23 NOTE — Progress Notes (Signed)
Physical Therapy Treatment Patient Details Name: John Andrade MRN: [id number] DOB: Jul 16, 1955 Today's Date: 09/23/2015    History of Present Illness 60 y.o. male now s/p Rt direct anterior THA on 09/22/15. PMH: hypertension, lumbar fusion.     PT Comments    Pt making progress with mobility, able to ambulate 60 ft with rw and min guard assist. PT to continue to follow and progress mobility in anticipation of D/C to home with family support.   Follow Up Recommendations  Home health PT;Supervision for mobility/OOB     Equipment Recommendations  Rolling walker with 5" wheels;3in1 (PT)    Recommendations for Other Services       Precautions / Restrictions Precautions Precautions: Fall Restrictions Weight Bearing Restrictions: Yes RLE Weight Bearing: Weight bearing as tolerated    Mobility  Bed Mobility Overal bed mobility: Needs Assistance Bed Mobility: Supine to Sit     Supine to sit: HOB elevated;Min assist     General bed mobility comments: in chair upon arrival  Transfers Overall transfer level: Needs assistance Equipment used: Rolling walker (2 wheeled) Transfers: Sit to/from Stand Sit to Stand: Min guard Stand pivot transfers: Min guard       General transfer comment: cues for hand placement  Ambulation/Gait Ambulation/Gait assistance: Min guard Ambulation Distance (Feet): 60 Feet Assistive device: Rolling walker (2 wheeled) Gait Pattern/deviations: Step-through pattern;Decreased stance time - right;Decreased weight shift to right Gait velocity: decreased   General Gait Details: report having pain with Rt swing phase, improving ability to advance as gait progressed.    Stairs            Wheelchair Mobility    Modified Rankin (Stroke Patients Only)       Balance Overall balance assessment: Needs assistance Sitting-balance support: No upper extremity supported Sitting balance-Leahy Scale: Good     Standing balance support: Bilateral upper  extremity supported Standing balance-Leahy Scale: Poor Standing balance comment: using rw for support                    Cognition Arousal/Alertness: Awake/alert Behavior During Therapy: WFL for tasks assessed/performed Overall Cognitive Status: Within Functional Limits for tasks assessed                      Exercises Total Joint Exercises Ankle Circles/Pumps: AROM;Both;10 reps Quad Sets: Strengthening;Right;10 reps Heel Slides: AAROM;Right;10 reps Hip ABduction/ADduction: Strengthening;Right;10 reps    General Comments        Pertinent Vitals/Pain Pain Assessment: 0-10 Pain Score: 4  Pain Location: Rt hip Pain Descriptors / Indicators: Sore Pain Intervention(s): Limited activity within patient's tolerance;Monitored during session    Home Living    Prior Function      PT Goals (current goals can now be found in the care plan section) Acute Rehab PT Goals Patient Stated Goal: get more active PT Goal Formulation: With patient Time For Goal Achievement: 10/06/15 Potential to Achieve Goals: Good Progress towards PT goals: Progressing toward goals    Frequency  7X/week    PT Plan Current plan remains appropriate    Co-evaluation             End of Session Equipment Utilized During Treatment: Gait belt Activity Tolerance: Patient tolerated treatment well Patient left: in chair;with call bell/phone within reach;with family/visitor present     Time: 1791-5056 PT Time Calculation (min) (ACUTE ONLY): 23 min  Charges:  $Gait Training: 8-22 mins $Therapeutic Exercise: 8-22 mins  G Codes:      Christiane Ha, PT, CSCS Pager [phone] Office [phone]  09/23/2015, 1:31 PM

## 2015-09-23 NOTE — Progress Notes (Signed)
PT PROGRESS NOTE  Pt continues to make steady gains with mobility, able to ambulate 140 feet with rw and min guard/supervision assist. Continue to anticipate D/C to home with family support. Will need to attempt stairs prior to D/C.    09/23/15 1510  PT Visit Information  Last PT Received On 09/23/15  Assistance Needed +1  History of Present Illness 60 y.o. male now s/p Rt direct anterior THA on 09/22/15. PMH: hypertension, lumbar fusion.   Subjective Data  Subjective Ready to get back to bed.   Patient Stated Goal go home tomorrow.   Precautions  Precautions Fall  Restrictions  Weight Bearing Restrictions Yes  RLE Weight Bearing WBAT  Pain Assessment  Pain Assessment 0-10  Pain Score 4  Pain Location rt hip  Pain Descriptors / Indicators Sore  Pain Intervention(s) Limited activity within patient's tolerance;Monitored during session  Cognition  Arousal/Alertness Awake/alert  Behavior During Therapy WFL for tasks assessed/performed  Overall Cognitive Status Within Functional Limits for tasks assessed  Bed Mobility  Overal bed mobility Needs Assistance  Bed Mobility Sit to Supine  Sit to supine Min assist  General bed mobility comments min assist needed with Rt LE, bed flat no rail  Transfers  Overall transfer level Needs assistance  Equipment used Rolling walker (2 wheeled)  Transfers Sit to/from Stand  Sit to Stand Min guard  General transfer comment good technique  Ambulation/Gait  Ambulation/Gait assistance Min guard;Supervision  Ambulation Distance (Feet) 140 Feet  Assistive device Rolling walker (2 wheeled)  Gait Pattern/deviations Step-through pattern  General Gait Details working swing phase on rt and even weightbearing. Improving hip flexion as ambulation progressed  Gait velocity decreased  Balance  Overall balance assessment Needs assistance  Sitting-balance support No upper extremity supported  Sitting balance-Leahy Scale Good  Standing balance support During  functional activity  Standing balance-Leahy Scale Fair  Total Joint Exercises  Ankle Circles/Pumps AROM;Both;10 reps  Quad Sets Strengthening;Right;10 reps  Heel Slides AAROM;Right;10 reps  Hip ABduction/ADduction Strengthening;Right;10 reps  PT - End of Session  Equipment Utilized During Treatment Gait belt  Activity Tolerance Patient tolerated treatment well  Patient left in bed;with call bell/phone within reach;with family/visitor present;with SCD's reapplied  Nurse Communication Weight bearing status;Mobility status  PT - Assessment/Plan  PT Plan Current plan remains appropriate  PT Frequency (ACUTE ONLY) 7X/week  Follow Up Recommendations Home health PT;Supervision for mobility/OOB  PT equipment Rolling walker with 5" wheels;3in1 (PT)  PT Goal Progression  Progress towards PT goals Progressing toward goals  Acute Rehab PT Goals  PT Goal Formulation With patient  Time For Goal Achievement 10/06/15  Potential to Achieve Goals Good  PT Time Calculation  PT Start Time (ACUTE ONLY) 1439  PT Stop Time (ACUTE ONLY) 1508  PT Time Calculation (min) (ACUTE ONLY) 29 min  PT General Charges  $$ ACUTE PT VISIT 1 Procedure  PT Treatments  $Gait Training 8-22 mins  $Therapeutic Exercise 8-22 mins  Christiane Ha, PT, CSCS Pager [phone] Office [phone]

## 2015-09-24 MED ORDER — METHOCARBAMOL 500 MG PO TABS: 500.0000 mg | ORAL_TABLET | Freq: Four times a day (QID) | ORAL | 0 refills | Status: DC | PRN

## 2015-09-24 MED ORDER — OXYCODONE HCL 10 MG PO TABS: 10.0000 mg | ORAL_TABLET | Freq: Four times a day (QID) | ORAL | 0 refills | Status: DC | PRN

## 2015-09-24 MED ORDER — ASPIRIN 325 MG PO TBEC: 325.0000 mg | DELAYED_RELEASE_TABLET | Freq: Two times a day (BID) | ORAL | 0 refills | Status: DC

## 2015-09-24 NOTE — Discharge Summary (Signed)
Patient ID: TRAYVON TRUMBULL MRN: [id number] DOB/AGE: 58-Mar-1957 58 y.o.  Admit date: 09/22/2015 Discharge date: 09/24/2015  Admission Diagnoses:  Principal Problem:   Primary osteoarthritis of right hip Active Problems:   Morbidly obese [facility])   Discharge Diagnoses:  Same  Past Medical History:  Diagnosis Date  . DDD lumbar   . DJD (degenerative joint disease)   . GERD (gastroesophageal reflux disease)   . Hyperlipidemia   . Hypertension   . Multiple allergies   . Obese   . PONV (postoperative nausea and vomiting)   . Wears glasses     Surgeries: Procedure(s): TOTAL HIP ARTHROPLASTY ANTERIOR APPROACH on 09/22/2015   Consultants:   Discharged Condition: Improved  Hospital Course: DAAIEL STARLIN is an 60 y.o. male who was admitted 09/22/2015 for operative treatment ofPrimary osteoarthritis of right hip. Patient has severe unremitting pain that affects sleep, daily activities, and work/hobbies. After pre-op clearance the patient was taken to the operating room on 09/22/2015 and underwent  Procedure(s): TOTAL HIP ARTHROPLASTY ANTERIOR APPROACH.    Patient was given perioperative antibiotics: Anti-infectives    Start     Dose/Rate Route Frequency Ordered Stop   09/22/15 1330  ceFAZolin (ANCEF) IVPB 2g/100 mL premix     2 g 200 mL/hr over 30 Minutes Intravenous Every 6 hours 09/22/15 1122 09/22/15 2124   09/22/15 0545  ceFAZolin (ANCEF) 2-4 GM/100ML-% IVPB    Comments:  Dia Crawford   : cabinet override      09/22/15 0545 09/22/15 1759   09/22/15 0544  ceFAZolin (ANCEF) IVPB 2g/100 mL premix     2 g 200 mL/hr over 30 Minutes Intravenous On call to O.R. 09/22/15 0544 09/22/15 0740       Patient was given sequential compression devices, early ambulation, and chemoprophylaxis to prevent DVT.  Patient benefited maximally from hospital stay and there were no complications.    Recent vital signs: Patient Vitals for the past 24 hrs:  BP Temp Temp src Pulse Resp SpO2  09/24/15 0838  129/77 - - - - -  09/24/15 0556 129/77 98.4 F (36.9 C) Oral 70 18 97 %  09/23/15 2227 128/74 98.7 F (37.1 C) Oral 84 18 96 %  09/23/15 1735 - 98.6 F (37 C) Oral - - -     Recent laboratory studies: No results for input(s): WBC, HGB, HCT, PLT, NA, K, CL, CO2, BUN, CREATININE, GLUCOSE, INR, CALCIUM in the last 72 hours.  Invalid input(s): PT, 2   Discharge Medications:     Medication List    STOP taking these medications   meloxicam 15 MG tablet Commonly known as:  MOBIC     TAKE these medications   amLODipine 10 MG tablet Commonly known as:  NORVASC TAKE 1 TABLET DAILY   aspirin 325 MG EC tablet Take 1 tablet (325 mg total) by mouth 2 (two) times daily after a meal.   fexofenadine 180 MG tablet Commonly known as:  ALLEGRA Take 1 tablet (180 mg total) by mouth daily.   gabapentin 800 MG tablet Commonly known as:  NEURONTIN Take 800 mg by mouth daily.   L-LYSINE PO Take 1 tablet by mouth daily.   lisinopril 20 MG tablet Commonly known as:  PRINIVIL,ZESTRIL TAKE 1 TABLET DAILY   loratadine 10 MG tablet Commonly known as:  CLARITIN TAKE 1 TABLET DAILY   methocarbamol 500 MG tablet Commonly known as:  ROBAXIN Take 1 tablet (500 mg total) by mouth every 6 (six) hours as needed for  muscle spasms.   montelukast 10 MG tablet Commonly known as:  SINGULAIR TAKE 1 TABLET (10 MG TOTAL) BY MOUTH DAILY.   omeprazole 40 MG capsule Commonly known as:  PRILOSEC TAKE 1 CAPSULE (40 MG TOTAL) BY MOUTH 2 (TWO) TIMES DAILY.   Oxycodone HCl 10 MG Tabs Take 1-2 tablets (10-20 mg total) by mouth every 6 (six) hours as needed. pain What changed:  how much to take   VITAMIN D PO Take 1 tablet by mouth daily.       Diagnostic Studies: Dg Chest 2 View  Result Date: 09/08/2015 CLINICAL DATA:  Preoperative examination prior to hip joint replacement; history of hypertension, hyperlipidemia, kidney stones, morbid obesity. EXAM: CHEST  2 VIEW COMPARISON:  Chest x-ray dated  April 09, 2014. FINDINGS: The lungs are adequately inflated. The interstitial markings are coarse. The heart is normal in size. The pulmonary vascularity is not engorged. There is no interstitial nor alveolar infiltrate. There is calcification in the wall of the aortic arch. The bony thorax exhibits no acute abnormality. The patient has undergone upper lumbar interbody and posterior fusion. IMPRESSION: Mild chronic bronchitic changes. No acute cardiopulmonary abnormality. Aortic atherosclerosis. Electronically Signed   By: David  [place] M.D.   On: 09/08/2015 14:32   Dg C-arm 61-120 Min  Result Date: 09/22/2015 CLINICAL DATA:  Right hip arthroplasty. EXAM: OPERATIVE RIGHT HIP (WITH PELVIS IF PERFORMED) 1 VIEWS TECHNIQUE: Fluoroscopic spot image(s) were submitted for interpretation post-operatively. COMPARISON:  06/05/2015 FINDINGS: AP fluoroscopy of the right hip shows total joint replacement. No dislocation or periprosthetic fracture. IMPRESSION: Fluoroscopy for total right hip arthroplasty.  No adverse finding. Electronically Signed   By: Marnee Spring M.D.   On: 09/22/2015 09:22   Dg Hip Operative Unilat With Pelvis Right  Result Date: 09/22/2015 CLINICAL DATA:  Right hip arthroplasty. EXAM: OPERATIVE RIGHT HIP (WITH PELVIS IF PERFORMED) 1 VIEWS TECHNIQUE: Fluoroscopic spot image(s) were submitted for interpretation post-operatively. COMPARISON:  06/05/2015 FINDINGS: AP fluoroscopy of the right hip shows total joint replacement. No dislocation or periprosthetic fracture. IMPRESSION: Fluoroscopy for total right hip arthroplasty.  No adverse finding. Electronically Signed   By: Marnee Spring M.D.   On: 09/22/2015 09:22    Disposition: 01-Home or Self Care  Discharge Instructions    Call MD / Call 911    Complete by:  As directed   If you experience chest pain or shortness of breath, CALL 911 and be transported to the hospital emergency room.  If you develope a fever above 101 F, pus (white  drainage) or increased drainage or redness at the wound, or calf pain, call your surgeon's office.   Constipation Prevention    Complete by:  As directed   Drink plenty of fluids.  Prune juice may be helpful.  You may use a stool softener, such as Colace (over the counter) 100 mg twice a day.  Use MiraLax (over the counter) for constipation as needed.   Diet - low sodium heart healthy    Complete by:  As directed   Discharge instructions    Complete by:  As directed   INSTRUCTIONS AFTER JOINT REPLACEMENT   Remove items at home which could result in a fall. This includes throw rugs or furniture in walking pathways ICE to the affected joint every three hours while awake for 30 minutes at a time, for at least the first 3-5 days, and then as needed for pain and swelling.  Continue to use ice for pain and swelling. You  may notice swelling that will progress down to the foot and ankle.  This is normal after surgery.  Elevate your leg when you are not up walking on it.   Continue to use the breathing machine you got in the hospital (incentive spirometer) which will help keep your temperature down.  It is common for your temperature to cycle up and down following surgery, especially at night when you are not up moving around and exerting yourself.  The breathing machine keeps your lungs expanded and your temperature down.   DIET:  As you were doing prior to hospitalization, we recommend a well-balanced diet.  DRESSING / WOUND CARE / SHOWERING  You may shower 3 days after surgery, but keep the wounds dry during showering.  You may use an occlusive plastic wrap (Press'n Seal for example), NO SOAKING/SUBMERGING IN THE BATHTUB.  If the bandage gets wet, change with a clean dry gauze.  If the incision gets wet, pat the wound dry with a clean towel.  ACTIVITY  Increase activity slowly as tolerated, but follow the weight bearing instructions below.   No driving for 6 weeks or until further direction given by  your physician.  You cannot drive while taking narcotics.  No lifting or carrying greater than 10 lbs. until further directed by your surgeon. Avoid periods of inactivity such as sitting longer than an hour when not asleep. This helps prevent blood clots.  You may return to work once you are authorized by your doctor.     WEIGHT BEARING   Weight bearing as tolerated with assist device (walker, cane, etc) as directed, use it as long as suggested by your surgeon or therapist, typically at least 4-6 weeks.   EXERCISES  Results after joint replacement surgery are often greatly improved when you follow the exercise, range of motion and muscle strengthening exercises prescribed by your doctor. Safety measures are also important to protect the joint from further injury. Any time any of these exercises cause you to have increased pain or swelling, decrease what you are doing until you are comfortable again and then slowly increase them. If you have problems or questions, call your caregiver or physical therapist for advice.   Rehabilitation is important following a joint replacement. After just a few days of immobilization, the muscles of the leg can become weakened and shrink (atrophy).  These exercises are designed to build up the tone and strength of the thigh and leg muscles and to improve motion. Often times heat used for twenty to thirty minutes before working out will loosen up your tissues and help with improving the range of motion but do not use heat for the first two weeks following surgery (sometimes heat can increase post-operative swelling).   These exercises can be done on a training (exercise) mat, on the floor, on a table or on a bed. Use whatever works the best and is most comfortable for you.    Use music or television while you are exercising so that the exercises are a pleasant break in your day. This will make your life better with the exercises acting as a break in your routine that  you can look forward to.   Perform all exercises about fifteen times, three times per day or as directed.  You should exercise both the operative leg and the other leg as well.   Exercises include:   Quad Sets - Tighten up the muscle on the front of the thigh (Quad) and hold for 5-10  seconds.   Straight Leg Raises - With your knee straight (if you were given a brace, keep it on), lift the leg to 60 degrees, hold for 3 seconds, and slowly lower the leg.  Perform this exercise against resistance later as your leg gets stronger.  Leg Slides: Lying on your back, slowly slide your foot toward your buttocks, bending your knee up off the floor (only go as far as is comfortable). Then slowly slide your foot back down until your leg is flat on the floor again.  Angel Wings: Lying on your back spread your legs to the side as far apart as you can without causing discomfort.  Hamstring Strength:  Lying on your back, push your heel against the floor with your leg straight by tightening up the muscles of your buttocks.  Repeat, but this time bend your knee to a comfortable angle, and push your heel against the floor.  You may put a pillow under the heel to make it more comfortable if necessary.   A rehabilitation program following joint replacement surgery can speed recovery and prevent re-injury in the future due to weakened muscles. Contact your doctor or a physical therapist for more information on knee rehabilitation.    CONSTIPATION  Constipation is defined medically as fewer than three stools per week and severe constipation as less than one stool per week.  Even if you have a regular bowel pattern at home, your normal regimen is likely to be disrupted due to multiple reasons following surgery.  Combination of anesthesia, postoperative narcotics, change in appetite and fluid intake all can affect your bowels.   YOU MUST use at least one of the following options; they are listed in order of increasing  strength to get the job done.  They are all available over the counter, and you may need to use some, POSSIBLY even all of these options:    Drink plenty of fluids (prune juice may be helpful) and high fiber foods Colace 100 mg by mouth twice a day  Senokot for constipation as directed and as needed Dulcolax (bisacodyl), take with full glass of water  Miralax (polyethylene glycol) once or twice a day as needed.  If you have tried all these things and are unable to have a bowel movement in the first 3-4 days after surgery call either your surgeon or your primary doctor.    If you experience loose stools or diarrhea, hold the medications until you stool forms back up.  If your symptoms do not get better within 1 week or if they get worse, check with your doctor.  If you experience "the worst abdominal pain ever" or develop nausea or vomiting, please contact the office immediately for further recommendations for treatment.   ITCHING:  If you experience itching with your medications, try taking only a single pain pill, or even half a pain pill at a time.  You can also use Benadryl over the counter for itching or also to help with sleep.   TED HOSE STOCKINGS:  Use stockings on both legs until for at least 2 weeks or as directed by physician office. They may be removed at night for sleeping.  MEDICATIONS:  See your medication summary on the "After Visit Summary" that nursing will review with you.  You may have some home medications which will be placed on hold until you complete the course of blood thinner medication.  It is important for you to complete the blood thinner medication as prescribed.  PRECAUTIONS:  If you experience chest pain or shortness of breath - call 911 immediately for transfer to the hospital emergency department.   If you develop a fever greater that 101 F, purulent drainage from wound, increased redness or drainage from wound, foul odor from the wound/dressing, or calf pain -  CONTACT YOUR SURGEON.                                                   FOLLOW-UP APPOINTMENTS:  If you do not already have a post-op appointment, please call the office for an appointment to be seen by your surgeon.  Guidelines for how soon to be seen are listed in your "After Visit Summary", but are typically between 1-4 weeks after surgery.  OTHER INSTRUCTIONS:   Knee Replacement:  Do not place pillow under knee, focus on keeping the knee straight while resting. CPM instructions: 0-90 degrees, 2 hours in the morning, 2 hours in the afternoon, and 2 hours in the evening. Place foam block, curve side up under heel at all times except when in CPM or when walking.  DO NOT modify, tear, cut, or change the foam block in any way.  MAKE SURE YOU:  Understand these instructions.  Get help right away if you are not doing well or get worse.    Thank you for letting us be a part of your medical care team.  It is a privilege we respect greatly.  We hope these instructions will help you stay on track for a fast and full recovery!   Increase activity slowly as tolerated    Complete by:  As directed      Follow-up Information    DALLDORF,PETER G, MD. Schedule an appointment as soon as possible for a visit in 2 week(s).   Specialty:  Orthopedic Surgery Contact information: [address] [place] [address] [phone]        [facility] .   Why:  Someone from [employer] information: [address] [place] [address] [phone]            Signed: ALECXANDER, MAINWARING 09/24/2015, 3:09 PM

## 2015-09-24 NOTE — Progress Notes (Signed)
Reviewed Discharge papers and medication with full understanding

## 2015-09-24 NOTE — Progress Notes (Signed)
Physical Therapy Treatment Patient Details Name: John Andrade MRN: [id number] DOB: 1956/01/27 Today's Date: 09/24/2015    History of Present Illness 60 y.o. male now s/p Rt direct anterior THA on 09/22/15. PMH: hypertension, lumbar fusion.     PT Comments    Pt performed stair training and increased gait training distance this session.  Pt c/o of tightness in R quad which improved post stretching.  Will f/u in pm to advance mobility.    Follow Up Recommendations  Home health PT;Supervision for mobility/OOB     Equipment Recommendations  Rolling walker with 5" wheels;3in1 (PT)    Recommendations for Other Services       Precautions / Restrictions Precautions Precautions: Fall Restrictions Weight Bearing Restrictions: Yes RLE Weight Bearing: Weight bearing as tolerated    Mobility  Bed Mobility Overal bed mobility: Needs Assistance             General bed mobility comments: Pt received in recliner on arrival.    Transfers Overall transfer level: Needs assistance Equipment used: Rolling walker (2 wheeled) Transfers: Sit to/from Stand Sit to Stand: Supervision Stand pivot transfers: Supervision       General transfer comment: Cues for hand placement and forward advancement of RLE.    Ambulation/Gait Ambulation/Gait assistance: Min guard;Supervision Ambulation Distance (Feet): 250 Feet Assistive device: Rolling walker (2 wheeled) Gait Pattern/deviations: Step-through pattern;Antalgic Gait velocity: decreased Gait velocity interpretation: Below normal speed for age/gender General Gait Details: Pt required cues for hip and knee flexion on R during swing phase to avoid hip hiking.    Stairs Stairs: Yes Stairs assistance: Min guard Stair Management: One rail Right;Sideways;Step to pattern Number of Stairs: 3 General stair comments: Cues for sequencing and foot placement to maintain safety and improve ease.    Wheelchair Mobility    Modified Rankin (Stroke  Patients Only)       Balance     Sitting balance-Leahy Scale: Good       Standing balance-Leahy Scale: Fair                      Cognition Arousal/Alertness: Awake/alert Behavior During Therapy: WFL for tasks assessed/performed Overall Cognitive Status: Within Functional Limits for tasks assessed                      Exercises Total Joint Exercises Ankle Circles/Pumps: AROM;Both;10 reps;Supine Quad Sets: AROM;Right;10 reps;Supine Heel Slides: Right;AAROM;10 reps;Supine Hip ABduction/ADduction: AAROM;Right;10 reps;Supine Long Arc Quad: AROM;Right;Seated;10 reps Other Exercises Other Exercises: PTA performed quad and HS stretch.  Required facilitation during HS stretch to avoid R knee hyper extension.  2x30 sec.      General Comments        Pertinent Vitals/Pain Pain Assessment: 0-10 Faces Pain Scale: Hurts little more Pain Location: R hip Pain Descriptors / Indicators: Sore;Tightness;Guarding Pain Intervention(s): Monitored during session;Repositioned;Ice applied    Home Living                      Prior Function            PT Goals (current goals can now be found in the care plan section) Acute Rehab PT Goals Patient Stated Goal: go home tomorrow.  Potential to Achieve Goals: Good Progress towards PT goals: Progressing toward goals    Frequency  7X/week    PT Plan Current plan remains appropriate    Co-evaluation             End  of Session Equipment Utilized During Treatment: Gait belt Activity Tolerance: Patient tolerated treatment well Patient left: in bed;with call bell/phone within reach;with family/visitor present;with SCD's reapplied     Time: 1610-9604 PT Time Calculation (min) (ACUTE ONLY): 31 min  Charges:  $Gait Training: 8-22 mins $Therapeutic Exercise: 8-22 mins                    G Codes:      Florestine Avers 23-Oct-2015, 9:41 AM  Joycelyn Rua, PTA pager [phone]

## 2015-09-24 NOTE — Anesthesia Postprocedure Evaluation (Signed)
Anesthesia Post Note  Patient: John Andrade  Procedure(s) Performed: Procedure(s) (LRB): TOTAL HIP ARTHROPLASTY ANTERIOR APPROACH (Right)  Patient location during evaluation: PACU Anesthesia Type: General Level of consciousness: sedated Pain management: satisfactory to patient Vital Signs Assessment: post-procedure vital signs reviewed and stable Respiratory status: spontaneous breathing Cardiovascular status: stable Anesthetic complications: no    Last Vitals:  Vitals:   09/24/15 0556 09/24/15 0838  BP: 129/77 129/77  Pulse: 70   Resp: 18   Temp: 36.9 C     Last Pain:  Vitals:   09/24/15 0704  TempSrc:   PainSc: 2                  Olia Hinderliter EDWARD

## 2015-09-25 DIAGNOSIS — Z471 Aftercare following joint replacement surgery: Secondary | ICD-10-CM | POA: Diagnosis not present

## 2015-09-25 DIAGNOSIS — I10 Essential (primary) hypertension: Secondary | ICD-10-CM | POA: Diagnosis not present

## 2015-09-25 DIAGNOSIS — E785 Hyperlipidemia, unspecified: Secondary | ICD-10-CM | POA: Diagnosis not present

## 2015-09-25 DIAGNOSIS — Z96641 Presence of right artificial hip joint: Secondary | ICD-10-CM | POA: Diagnosis not present

## 2015-10-03 ENCOUNTER — Other Ambulatory Visit: Payer: Self-pay | Admitting: Internal Medicine

## 2015-11-02 ENCOUNTER — Other Ambulatory Visit: Payer: Self-pay | Admitting: Internal Medicine

## 2015-11-06 ENCOUNTER — Other Ambulatory Visit: Payer: Self-pay | Admitting: Orthopaedic Surgery

## 2015-11-30 ENCOUNTER — Other Ambulatory Visit: Payer: Self-pay | Admitting: Internal Medicine

## 2015-12-28 ENCOUNTER — Other Ambulatory Visit: Payer: Self-pay | Admitting: Physician Assistant

## 2015-12-28 ENCOUNTER — Other Ambulatory Visit: Payer: Self-pay

## 2015-12-28 ENCOUNTER — Encounter (HOSPITAL_COMMUNITY): Payer: Self-pay

## 2015-12-28 ENCOUNTER — Encounter (HOSPITAL_COMMUNITY)
Admission: RE | Admit: 2015-12-28 | Discharge: 2015-12-28 | Disposition: A | Discharge status: Home / Self Care | Payer: BLUE CROSS/BLUE SHIELD | Source: Ambulatory Visit | Attending: Orthopaedic Surgery | Admitting: Orthopaedic Surgery

## 2015-12-28 DIAGNOSIS — Z01818 Encounter for other preprocedural examination: Secondary | ICD-10-CM | POA: Insufficient documentation

## 2015-12-28 DIAGNOSIS — M1612 Unilateral primary osteoarthritis, left hip: Secondary | ICD-10-CM | POA: Insufficient documentation

## 2015-12-28 LAB — URINALYSIS, ROUTINE W REFLEX MICROSCOPIC
Bilirubin Urine: NEGATIVE
Glucose, UA: NEGATIVE mg/dL
Hgb urine dipstick: NEGATIVE
Ketones, ur: NEGATIVE mg/dL
Leukocytes, UA: NEGATIVE
Nitrite: NEGATIVE
Protein, ur: NEGATIVE mg/dL
Specific Gravity, Urine: 1.017 (ref 1.005–1.030)
pH: 5.5 (ref 5.0–8.0)

## 2015-12-28 LAB — CBC WITH DIFFERENTIAL/PLATELET
Basophils Absolute: 0 10*3/uL (ref 0.0–0.1)
Basophils Relative: 0 %
Eosinophils Absolute: 0.2 10*3/uL (ref 0.0–0.7)
Eosinophils Relative: 2 %
HCT: 44.5 % (ref 39.0–52.0)
Hemoglobin: 14.4 g/dL (ref 13.0–17.0)
Lymphocytes Relative: 26 %
Lymphs Abs: 1.9 10*3/uL (ref 0.7–4.0)
MCH: 26.7 pg (ref 26.0–34.0)
MCHC: 32.4 g/dL (ref 30.0–36.0)
MCV: 82.4 fL (ref 78.0–100.0)
Monocytes Absolute: 0.5 10*3/uL (ref 0.1–1.0)
Monocytes Relative: 8 %
Neutro Abs: 4.5 10*3/uL (ref 1.7–7.7)
Neutrophils Relative %: 64 %
Platelets: 306 10*3/uL (ref 150–400)
RBC: 5.4 MIL/uL (ref 4.22–5.81)
RDW: 13.2 % (ref 11.5–15.5)
WBC: 7.1 10*3/uL (ref 4.0–10.5)

## 2015-12-28 LAB — BASIC METABOLIC PANEL
Anion gap: 11 (ref 5–15)
BUN: 14 mg/dL (ref 6–20)
CO2: 23 mmol/L (ref 22–32)
Calcium: 9.5 mg/dL (ref 8.9–10.3)
Chloride: 105 mmol/L (ref 101–111)
Creatinine, Ser: 0.99 mg/dL (ref 0.61–1.24)
GFR calc Af Amer: >60 mL/min (ref >60)
GFR calc non Af Amer: >60 mL/min (ref >60)
Glucose, Bld: 134 mg/dL — ABNORMAL HIGH (ref 65–99)
Potassium: 4 mmol/L (ref 3.5–5.1)
Sodium: 139 mmol/L (ref 135–145)

## 2015-12-28 LAB — APTT: aPTT: 31 seconds (ref 24–36)

## 2015-12-28 LAB — TYPE AND SCREEN
ABO/RH(D): A POS
Antibody Screen: NEGATIVE

## 2015-12-28 LAB — PROTIME-INR
INR: 0.96
Prothrombin Time: 12.8 seconds (ref 11.4–15.2)

## 2015-12-28 LAB — SURGICAL PCR SCREEN
MRSA, PCR: NEGATIVE
Staphylococcus aureus: NEGATIVE

## 2015-12-28 MED ORDER — CHLORHEXIDINE GLUCONATE 4 % EX LIQD: 60.0000 mL | Freq: Once | CUTANEOUS | Status: DC

## 2015-12-28 NOTE — Pre-Procedure Instructions (Signed)
Laverda Sorensonaul T Wolaver  12/28/2015      [facility] [place] [id number] [address] [place] [address] Phone: [phone] Fax: [phone]  [facility] [address] - [place], [place] - [id number] [place] [address] Phone: [phone] Fax: [phone]    Your procedure is scheduled on Tues, Nov 14 @ 7:30 AM  [facility] Admitting at 5:30 AM  Call this number if you have problems the morning of surgery:  [phone]   Remember:  Do not eat food or drink liquids after midnight.  Take these medicines the morning of surgery with A SIP OF WATER Amlodipine(Norvasc),Gabapentin(Neurontin),Claritin(Loratadine),Singulair(Montelukast),Omeprazole(Prilosec),and Pain Pill(if needed)              No Goody's,BC's,Aleve,Advil,Motrin,Ibuprofen,Fish Oil,or any Herbal Medications.    Do not wear jewelry.  Do not wear lotions, powders,colognes, or deoderant.  Men may shave face and neck.  Do not bring valuables to the hospital.  [facility] is not responsible for any belongings or valuables.  Contacts, dentures or bridgework may not be worn into surgery.  Leave your suitcase in the car.  After surgery it may be brought to your room.  For patients admitted to the hospital, discharge time will be determined by your treatment team.  Patients discharged the day of surgery will not be allowed to drive home.    [facility]  Before surgery, you can play an important role.  Because skin is not sterile, your skin needs to be as free of germs as possible.  You can reduce the number of germs on you skin by washing with CHG (chlorahexidine gluconate) soap before surgery.  CHG is an antiseptic cleaner which kills germs and bonds with the skin to continue killing germs even after washing.  Please DO NOT use if you have an allergy to CHG or antibacterial soaps.  If your skin  becomes reddened/irritated stop using the CHG and inform your nurse when you arrive at Short Stay.  Do not shave (including legs and underarms) for at least 48 hours prior to the first CHG shower.  You may shave your face.  Please follow these instructions carefully:   1.  Shower with CHG Soap the night before surgery and the                                morning of Surgery.  2.  If you choose to wash your hair, wash your hair first as usual with your       normal shampoo.  3.  After you shampoo, rinse your hair and body thoroughly to remove the                      Shampoo.  4.  Use CHG as you would any other liquid soap.  You can apply chg directly       to the skin and wash gently with scrungie or a clean washcloth.  5.  Apply the CHG Soap to your body ONLY FROM THE NECK DOWN.        Do not use on open wounds or open sores.  Avoid contact with your eyes,       ears, mouth and genitals (private parts).  Wash genitals (private parts)       with your normal soap.  6.  Wash thoroughly, paying special attention to the area where your surgery        will be performed.  7.  Thoroughly rinse your body with warm water from the neck down.  8.  DO NOT shower/wash with your normal soap after using and rinsing off       the CHG Soap.  9.  Pat yourself dry with a clean towel.            10.  Wear clean pajamas.            11.  Place clean sheets on your bed the night of your first shower and do not        sleep with pets.  Day of Surgery  Do not apply any lotions/deoderants the morning of surgery.  Please wear clean clothes to the hospital/surgery center.     Please read over the following fact sheets that you were given. Pain Booklet, Coughing and Deep Breathing, MRSA Information and Surgical Site Infection Prevention

## 2015-12-28 NOTE — Progress Notes (Signed)
   12/28/15 1025  OBSTRUCTIVE SLEEP APNEA  Have you ever been diagnosed with sleep apnea through a sleep study? No  Do you snore loudly (loud enough to be heard through closed doors)?  0  Do you often feel tired, fatigued, or sleepy during the daytime (such as falling asleep during driving or talking to someone)? 0  Has anyone observed you stop breathing during your sleep? 0  Do you have, or are you being treated for high blood pressure? 1  BMI more than 35 kg/m2? 0  Age > 50 (1-yes) 1  Neck circumference greater than:Male 16 inches or larger, Male 17inches or larger? 1 55(17)  Male Gender (Yes=1) 1  Obstructive Sleep Apnea Score 4  Score 5 or greater  Results sent to PCP

## 2015-12-28 NOTE — Progress Notes (Signed)
Cardiologist denies  Medical Md is Dr.McKeowen  Echo denies  Stress test/heart cath around 2007  CXR in epic from 09-08-15  EKG > 1 yr ago

## 2016-01-04 ENCOUNTER — Other Ambulatory Visit: Payer: Self-pay | Admitting: *Deleted

## 2016-01-04 MED ORDER — LISINOPRIL 20 MG PO TABS: ORAL_TABLET | ORAL | 0 refills | Status: DC

## 2016-01-04 MED ORDER — OMEPRAZOLE 40 MG PO CPDR: DELAYED_RELEASE_CAPSULE | ORAL | 0 refills | Status: DC

## 2016-01-04 MED ORDER — CEFAZOLIN SODIUM-DEXTROSE 2-4 GM/100ML-% IV SOLN
2.0000 g | INTRAVENOUS | Status: AC
  Administered 2016-01-05: 2 g via INTRAVENOUS
  Filled 2016-01-04: qty 100

## 2016-01-04 MED ORDER — MONTELUKAST SODIUM 10 MG PO TABS: ORAL_TABLET | ORAL | 0 refills | Status: DC

## 2016-01-04 NOTE — H&P (Signed)
TOTAL HIP ADMISSION H&P  Patient is admitted for left total hip arthroplasty.  Subjective:  Chief Complaint: left hip pain  HPI: John Andrade, 60 y.o. male, has a history of pain and functional disability in the left hip(s) due to arthritis and patient has failed non-surgical conservative treatments for greater than 12 weeks to include NSAID's and/or analgesics, corticosteriod injections, flexibility and strengthening excercises, supervised PT with diminished ADL's post treatment, use of assistive devices, weight reduction as appropriate and activity modification.  Onset of symptoms was gradual starting 5 years ago with gradually worsening course since that time.The patient noted no past surgery on the left hip(s).  Patient currently rates pain in the left hip at 10 out of 10 with activity. Patient has night pain, worsening of pain with activity and weight bearing, trendelenberg gait, pain that interfers with activities of daily living and crepitus. Patient has evidence of subchondral cysts, subchondral sclerosis, periarticular osteophytes and joint space narrowing by imaging studies. This condition presents safety issues increasing the risk of falls.  There is no current active infection.  Patient Active Problem List   Diagnosis Date Noted  . Primary osteoarthritis of right hip 09/22/2015  . Other abnormal glucose 05/08/2015  . Vitamin D deficiency 05/08/2015  . Medication management 05/08/2015  . History of nephrolithiasis 05/08/2015  . Chronic pain 05/08/2015  . DDD (degenerative disc disease), lumbar 05/08/2015  . Morbidly obese (HCC)   . Hyperlipidemia   . Hypertension    Past Medical History:  Diagnosis Date  . DDD lumbar   . DJD (degenerative joint disease)   . GERD (gastroesophageal reflux disease)    takes Omeprazole daily  . Hyperlipidemia   . Hypertension    takes Lisinopril and Amlodipine daily  . Multiple allergies    takes Claritin and Singulair daily  . PONV  (postoperative nausea and vomiting)     Past Surgical History:  Procedure Laterality Date  . ANKLE FRACTURE SURGERY  1995   right  . BACK SURGERY  2002   lumb lam-fusion  . BACK SURGERY  03-2014   [place] Channing [place]  . CARDIAC CATHETERIZATION     2007  . CHONDROPLASTY Left 10/28/2014   Procedure: CHONDROPLASTY;  Surgeon: Marcene CorningPeter Dalldorf, MD;  Location: [facility];  Service: Orthopedics;  Laterality: Left;  . COLONOSCOPY    . ELBOW LIGAMENT RECONSTRUCTION Right 2015   [place]  . KNEE ARTHROSCOPY Left 04/24/2012   Procedure: ARTHROSCOPY KNEE, PARTIAL MEDIAL MENISECTOMY AND CHONDROPLASTY;  Surgeon: Velna OchsPeter G Dalldorf, MD;  Location: [facility];  Service: Orthopedics;  Laterality: Left;  . KNEE ARTHROSCOPY WITH MEDIAL MENISECTOMY Left 10/28/2014   Procedure: LEFT ARTHROSCOPY KNEE, PARTIAL MEDIAL MENISECTOMY, CHONDROPLASTY;  Surgeon: Marcene CorningPeter Dalldorf, MD;  Location: [facility];  Service: Orthopedics;  Laterality: Left;  . TOTAL HIP ARTHROPLASTY Right 09/22/2015   Procedure: TOTAL HIP ARTHROPLASTY ANTERIOR APPROACH;  Surgeon: Marcene CorningPeter Dalldorf, MD;  Location: [facility];  Service: Orthopedics;  Laterality: Right;    No prescriptions prior to admission.   Allergies  Allergen Reactions  . Statins Other (See Comments)    Flu-like symptoms  . Phentermine Other (See Comments)    constipation    Social History  Substance Use Topics  . Smoking status: Former Smoker    Quit date: 02/22/1975  . Smokeless tobacco: Never Used  . Alcohol use No    Family History  Problem Relation Age of Onset  . Hypertension Mother   . Dementia Mother   .  Cancer Father   . Hypertension Father   . Heart disease Father   . Hyperlipidemia Father   . Diabetes Sister   . Cancer Maternal Grandmother     lung     Review of Systems  Musculoskeletal: Positive for joint pain.       Left hip  All other systems reviewed and are negative.   Objective:  Physical Exam  Constitutional:  He is oriented to person, place, and time. He appears well-developed and well-nourished.  HENT:  Head: Normocephalic and atraumatic.  Eyes: Pupils are equal, round, and reactive to light.  Neck: Normal range of motion.  Cardiovascular: Normal rate and regular rhythm.   Respiratory: Effort normal.  GI: Soft.  Musculoskeletal:  Left hip has very limited motion all of which is extremely painful. His leg is a bit short of this side. He walks with an altered gait. Opposite hip moves fully. Sensation and motor function are intact distally. Palpable pulses in his feet.   Neurological: He is alert and oriented to person, place, and time.  Skin: Skin is warm and dry.  Psychiatric: He has a normal mood and affect. His behavior is normal. Judgment and thought content normal.    Vital signs in last 24 hours:    Labs:   Estimated body mass index is 41.16 kg/m as calculated from the following:   Height as of 12/28/15: 5\' 6"  (1.676 m).   Weight as of 12/28/15: 115.7 kg (255 lb).   Imaging Review Plain radiographs demonstrate severe degenerative joint disease of the left hip(s). The bone quality appears to be good for age and reported activity level.  Assessment/Plan:  End stage primary arthritis, left hip(s)  The patient history, physical examination, clinical judgement of the provider and imaging studies are consistent with end stage degenerative joint disease of the left hip(s) and total hip arthroplasty is deemed medically necessary. The treatment options including medical management, injection therapy, arthroscopy and arthroplasty were discussed at length. The risks and benefits of total hip arthroplasty were presented and reviewed. The risks due to aseptic loosening, infection, stiffness, dislocation/subluxation,  thromboembolic complications and other imponderables were discussed.  The patient acknowledged the explanation, agreed to proceed with the plan and consent was signed. Patient is  being admitted for inpatient treatment for surgery, pain control, PT, OT, prophylactic antibiotics, VTE prophylaxis, progressive ambulation and ADL's and discharge planning.The patient is planning to be discharged home with home health services

## 2016-01-04 NOTE — Anesthesia Preprocedure Evaluation (Addendum)
Anesthesia Evaluation  Patient identified by MRN, date of birth, ID band Patient awake    Reviewed: Allergy & Precautions, NPO status , Patient's Chart, lab work & pertinent test results  History of Anesthesia Complications (+) PONV and history of anesthetic complications  Airway Mallampati: II  TM Distance: >3 FB Neck ROM: Full    Dental  (+) Teeth Intact, Dental Advisory Given   Pulmonary former smoker,    breath sounds clear to auscultation       Cardiovascular hypertension, Pt. on medications  Rhythm:Regular Rate:Normal     Neuro/Psych negative neurological ROS  negative psych ROS   GI/Hepatic Neg liver ROS, GERD  Medicated,  Endo/Other  negative endocrine ROS  Renal/GU negative Renal ROS  negative genitourinary   Musculoskeletal  (+) Arthritis , Osteoarthritis,    Abdominal   Peds negative pediatric ROS (+)  Hematology negative hematology ROS (+)   Anesthesia Other Findings   Reproductive/Obstetrics negative OB ROS                            Lab Results  Component Value Date   WBC 7.1 12/28/2015   HGB 14.4 12/28/2015   HCT 44.5 12/28/2015   MCV 82.4 12/28/2015   PLT 306 12/28/2015   Lab Results  Component Value Date   CREATININE 0.99 12/28/2015   BUN 14 12/28/2015   NA 139 12/28/2015   K 4.0 12/28/2015   CL 105 12/28/2015   CO2 23 12/28/2015   Lab Results  Component Value Date   INR 0.96 12/28/2015   INR 1.00 09/08/2015     Anesthesia Physical Anesthesia Plan  ASA: III  Anesthesia Plan: General   Post-op Pain Management:    Induction: Intravenous  Airway Management Planned: Oral ETT  Additional Equipment:   Intra-op Plan:   Post-operative Plan: Extubation in OR  Informed Consent: I have reviewed the patients History and Physical, chart, labs and discussed the procedure including the risks, benefits and alternatives for the proposed anesthesia with  the patient or authorized representative who has indicated his/her understanding and acceptance.   Dental advisory given  Plan Discussed with: CRNA  Anesthesia Plan Comments: (Anesthetic plan discussed in detail. Associated risk discussed including but not limited to life threatening cardiovascular, pulmonary events and dental damage. The postoperative pain management and antiemetic plan discussed with patient. All questions answered in detail. Patient is in agreement.     Pt prefers GA. )       Anesthesia Quick Evaluation

## 2016-01-05 ENCOUNTER — Inpatient Hospital Stay (HOSPITAL_COMMUNITY): Payer: BLUE CROSS/BLUE SHIELD

## 2016-01-05 ENCOUNTER — Inpatient Hospital Stay (HOSPITAL_COMMUNITY): Payer: BLUE CROSS/BLUE SHIELD | Admitting: Anesthesiology

## 2016-01-05 ENCOUNTER — Encounter (HOSPITAL_COMMUNITY): Payer: Self-pay | Admitting: Urology

## 2016-01-05 ENCOUNTER — Encounter (HOSPITAL_COMMUNITY)
Admission: RE | Disposition: A | Discharge status: Home / Self Care | Payer: Self-pay | Source: Ambulatory Visit | Attending: Orthopaedic Surgery

## 2016-01-05 ENCOUNTER — Inpatient Hospital Stay (HOSPITAL_COMMUNITY)
Admission: RE | Admit: 2016-01-05 | Discharge: 2016-01-07 | DRG: 470 | Disposition: A | Discharge status: Home Health | Payer: BLUE CROSS/BLUE SHIELD | Source: Ambulatory Visit | Attending: Orthopaedic Surgery | Admitting: Orthopaedic Surgery

## 2016-01-05 DIAGNOSIS — M25552 Pain in left hip: Secondary | ICD-10-CM | POA: Diagnosis present

## 2016-01-05 DIAGNOSIS — K219 Gastro-esophageal reflux disease without esophagitis: Secondary | ICD-10-CM | POA: Diagnosis present

## 2016-01-05 DIAGNOSIS — G8929 Other chronic pain: Secondary | ICD-10-CM | POA: Diagnosis present

## 2016-01-05 DIAGNOSIS — E785 Hyperlipidemia, unspecified: Secondary | ICD-10-CM | POA: Diagnosis present

## 2016-01-05 DIAGNOSIS — Z6841 Body Mass Index (BMI) 40.0 and over, adult: Secondary | ICD-10-CM

## 2016-01-05 DIAGNOSIS — Z888 Allergy status to other drugs, medicaments and biological substances status: Secondary | ICD-10-CM | POA: Diagnosis not present

## 2016-01-05 DIAGNOSIS — Z96641 Presence of right artificial hip joint: Secondary | ICD-10-CM | POA: Diagnosis present

## 2016-01-05 DIAGNOSIS — R262 Difficulty in walking, not elsewhere classified: Secondary | ICD-10-CM

## 2016-01-05 DIAGNOSIS — Z419 Encounter for procedure for purposes other than remedying health state, unspecified: Secondary | ICD-10-CM

## 2016-01-05 DIAGNOSIS — I10 Essential (primary) hypertension: Secondary | ICD-10-CM | POA: Diagnosis present

## 2016-01-05 DIAGNOSIS — M1612 Unilateral primary osteoarthritis, left hip: Principal | ICD-10-CM | POA: Diagnosis present

## 2016-01-05 DIAGNOSIS — Z87891 Personal history of nicotine dependence: Secondary | ICD-10-CM

## 2016-01-05 DIAGNOSIS — Z981 Arthrodesis status: Secondary | ICD-10-CM | POA: Diagnosis not present

## 2016-01-05 DIAGNOSIS — Z8249 Family history of ischemic heart disease and other diseases of the circulatory system: Secondary | ICD-10-CM

## 2016-01-05 HISTORY — PX: TOTAL HIP ARTHROPLASTY: SHX124

## 2016-01-05 SURGERY — ARTHROPLASTY, HIP, TOTAL, ANTERIOR APPROACH
Anesthesia: General | Site: Hip | Laterality: Left

## 2016-01-05 MED ORDER — MEPERIDINE HCL 25 MG/ML IJ SOLN: 6.2500 mg | INTRAMUSCULAR | Status: DC | PRN

## 2016-01-05 MED ORDER — CEFAZOLIN SODIUM-DEXTROSE 2-4 GM/100ML-% IV SOLN
2.0000 g | Freq: Four times a day (QID) | INTRAVENOUS | Status: AC
  Administered 2016-01-05 (×2): 2 g via INTRAVENOUS
  Filled 2016-01-05 (×2): qty 100

## 2016-01-05 MED ORDER — BUPIVACAINE LIPOSOME 1.3 % IJ SUSP
INTRAMUSCULAR | Status: DC | PRN
  Administered 2016-01-05: 20 mL

## 2016-01-05 MED ORDER — 0.9 % SODIUM CHLORIDE (POUR BTL) OPTIME
TOPICAL | Status: DC | PRN
  Administered 2016-01-05: 1000 mL

## 2016-01-05 MED ORDER — HYDROMORPHONE HCL 1 MG/ML IJ SOLN
0.2500 mg | INTRAMUSCULAR | Status: DC | PRN
  Administered 2016-01-05 (×4): 0.5 mg via INTRAVENOUS

## 2016-01-05 MED ORDER — FENTANYL CITRATE (PF) 100 MCG/2ML IJ SOLN
INTRAMUSCULAR | Status: DC | PRN
  Administered 2016-01-05 (×2): 50 ug via INTRAVENOUS
  Administered 2016-01-05: 25 ug via INTRAVENOUS
  Administered 2016-01-05: 50 ug via INTRAVENOUS
  Administered 2016-01-05: 25 ug via INTRAVENOUS
  Administered 2016-01-05 (×2): 50 ug via INTRAVENOUS
  Administered 2016-01-05: 100 ug via INTRAVENOUS

## 2016-01-05 MED ORDER — PHENYLEPHRINE HCL 10 MG/ML IJ SOLN
INTRAMUSCULAR | Status: DC | PRN
  Administered 2016-01-05: 25 ug/min via INTRAVENOUS

## 2016-01-05 MED ORDER — ALUM & MAG HYDROXIDE-SIMETH 200-200-20 MG/5ML PO SUSP: 30.0000 mL | ORAL | Status: DC | PRN

## 2016-01-05 MED ORDER — GLYCOPYRROLATE 0.2 MG/ML IJ SOLN
INTRAMUSCULAR | Status: DC | PRN
  Administered 2016-01-05: 0.1 mg via INTRAVENOUS

## 2016-01-05 MED ORDER — SUGAMMADEX SODIUM 200 MG/2ML IV SOLN
INTRAVENOUS | Status: AC
  Filled 2016-01-05: qty 2

## 2016-01-05 MED ORDER — PROPOFOL 10 MG/ML IV BOLUS
INTRAVENOUS | Status: DC | PRN
  Administered 2016-01-05: 10 mg via INTRAVENOUS
  Administered 2016-01-05: 20 mg via INTRAVENOUS
  Administered 2016-01-05: 10 mg via INTRAVENOUS
  Administered 2016-01-05: 170 mg via INTRAVENOUS
  Administered 2016-01-05 (×2): 20 mg via INTRAVENOUS
  Administered 2016-01-05: 30 mg via INTRAVENOUS

## 2016-01-05 MED ORDER — ONDANSETRON HCL 4 MG/2ML IJ SOLN
INTRAMUSCULAR | Status: DC | PRN
  Administered 2016-01-05: 4 mg via INTRAVENOUS

## 2016-01-05 MED ORDER — LACTATED RINGERS IV SOLN: INTRAVENOUS | Status: DC

## 2016-01-05 MED ORDER — PHENOL 1.4 % MT LIQD: 1.0000 | OROMUCOSAL | Status: DC | PRN

## 2016-01-05 MED ORDER — TRANEXAMIC ACID 1000 MG/10ML IV SOLN
1000.0000 mg | INTRAVENOUS | Status: AC
  Administered 2016-01-05: 1000 mg via INTRAVENOUS
  Filled 2016-01-05: qty 10

## 2016-01-05 MED ORDER — MONTELUKAST SODIUM 10 MG PO TABS
10.0000 mg | ORAL_TABLET | Freq: Every day | ORAL | Status: DC
  Administered 2016-01-05 – 2016-01-06 (×2): 10 mg via ORAL
  Filled 2016-01-05 (×2): qty 1

## 2016-01-05 MED ORDER — FENTANYL CITRATE (PF) 100 MCG/2ML IJ SOLN
INTRAMUSCULAR | Status: AC
  Filled 2016-01-05: qty 2

## 2016-01-05 MED ORDER — ROCURONIUM BROMIDE 10 MG/ML (PF) SYRINGE
PREFILLED_SYRINGE | INTRAVENOUS | Status: AC
  Filled 2016-01-05: qty 10

## 2016-01-05 MED ORDER — METHOCARBAMOL 500 MG PO TABS
500.0000 mg | ORAL_TABLET | Freq: Four times a day (QID) | ORAL | Status: DC | PRN
  Administered 2016-01-05 – 2016-01-07 (×5): 500 mg via ORAL
  Filled 2016-01-05 (×5): qty 1

## 2016-01-05 MED ORDER — LIDOCAINE 2% (20 MG/ML) 5 ML SYRINGE
INTRAMUSCULAR | Status: AC
  Filled 2016-01-05: qty 5

## 2016-01-05 MED ORDER — MIDAZOLAM HCL 2 MG/2ML IJ SOLN
INTRAMUSCULAR | Status: AC
  Filled 2016-01-05: qty 2

## 2016-01-05 MED ORDER — GABAPENTIN 400 MG PO CAPS
800.0000 mg | ORAL_CAPSULE | Freq: Every day | ORAL | Status: DC
Start: 2016-01-05 — End: 2016-01-07
  Administered 2016-01-05 – 2016-01-06 (×2): 800 mg via ORAL
  Filled 2016-01-05 (×5): qty 2

## 2016-01-05 MED ORDER — ACETAMINOPHEN 325 MG PO TABS
650.0000 mg | ORAL_TABLET | Freq: Four times a day (QID) | ORAL | Status: DC | PRN
  Filled 2016-01-05: qty 2

## 2016-01-05 MED ORDER — ASPIRIN EC 325 MG PO TBEC
325.0000 mg | DELAYED_RELEASE_TABLET | Freq: Two times a day (BID) | ORAL | Status: DC
  Administered 2016-01-06 – 2016-01-07 (×3): 325 mg via ORAL
  Filled 2016-01-05 (×3): qty 1

## 2016-01-05 MED ORDER — METHOCARBAMOL 1000 MG/10ML IJ SOLN
500.0000 mg | Freq: Four times a day (QID) | INTRAVENOUS | Status: DC | PRN
  Filled 2016-01-05: qty 5

## 2016-01-05 MED ORDER — BUPIVACAINE HCL (PF) 0.25 % IJ SOLN
INTRAMUSCULAR | Status: AC
  Filled 2016-01-05: qty 30

## 2016-01-05 MED ORDER — BUPIVACAINE HCL (PF) 0.25 % IJ SOLN
INTRAMUSCULAR | Status: DC | PRN
  Administered 2016-01-05: 20 mL

## 2016-01-05 MED ORDER — BUPIVACAINE LIPOSOME 1.3 % IJ SUSP
20.0000 mL | INTRAMUSCULAR | Status: DC
  Filled 2016-01-05: qty 20

## 2016-01-05 MED ORDER — TRANEXAMIC ACID 1000 MG/10ML IV SOLN
INTRAVENOUS | Status: DC | PRN
  Administered 2016-01-05: 2000 mg via TOPICAL

## 2016-01-05 MED ORDER — PROPOFOL 10 MG/ML IV BOLUS
INTRAVENOUS | Status: AC
  Filled 2016-01-05: qty 40

## 2016-01-05 MED ORDER — SUCCINYLCHOLINE CHLORIDE 200 MG/10ML IV SOSY: PREFILLED_SYRINGE | INTRAVENOUS | Status: DC | PRN

## 2016-01-05 MED ORDER — SUGAMMADEX SODIUM 200 MG/2ML IV SOLN
INTRAVENOUS | Status: DC | PRN
  Administered 2016-01-05: 220 mg via INTRAVENOUS

## 2016-01-05 MED ORDER — ONDANSETRON HCL 4 MG PO TABS: 4.0000 mg | ORAL_TABLET | Freq: Four times a day (QID) | ORAL | Status: DC | PRN

## 2016-01-05 MED ORDER — LACTATED RINGERS IV SOLN
INTRAVENOUS | Status: DC
  Administered 2016-01-05 – 2016-01-06 (×2): via INTRAVENOUS

## 2016-01-05 MED ORDER — SCOPOLAMINE 1 MG/3DAYS TD PT72
MEDICATED_PATCH | TRANSDERMAL | Status: AC
  Filled 2016-01-05: qty 1

## 2016-01-05 MED ORDER — HYDROMORPHONE HCL 2 MG/ML IJ SOLN
INTRAMUSCULAR | Status: AC
  Filled 2016-01-05: qty 1

## 2016-01-05 MED ORDER — LIDOCAINE HCL (CARDIAC) 20 MG/ML IV SOLN
INTRAVENOUS | Status: DC | PRN
  Administered 2016-01-05: 100 mg via INTRAVENOUS

## 2016-01-05 MED ORDER — LACTATED RINGERS IV SOLN
INTRAVENOUS | Status: DC | PRN
  Administered 2016-01-05: 07:00:00 via INTRAVENOUS

## 2016-01-05 MED ORDER — SUCCINYLCHOLINE CHLORIDE 200 MG/10ML IV SOSY
PREFILLED_SYRINGE | INTRAVENOUS | Status: AC
  Filled 2016-01-05: qty 10

## 2016-01-05 MED ORDER — ROCURONIUM BROMIDE 50 MG/5ML IV SOSY
PREFILLED_SYRINGE | INTRAVENOUS | Status: DC | PRN
  Administered 2016-01-05: 5 mg via INTRAVENOUS
  Administered 2016-01-05 (×2): 10 mg via INTRAVENOUS
  Administered 2016-01-05: 5 mg via INTRAVENOUS
  Administered 2016-01-05: 10 mg via INTRAVENOUS

## 2016-01-05 MED ORDER — DEXAMETHASONE SODIUM PHOSPHATE 10 MG/ML IJ SOLN
INTRAMUSCULAR | Status: AC
  Filled 2016-01-05: qty 1

## 2016-01-05 MED ORDER — LORATADINE 10 MG PO TABS
10.0000 mg | ORAL_TABLET | Freq: Every day | ORAL | Status: DC
  Administered 2016-01-05 – 2016-01-06 (×2): 10 mg via ORAL
  Filled 2016-01-05 (×5): qty 1

## 2016-01-05 MED ORDER — GLYCOPYRROLATE 0.2 MG/ML IV SOSY
PREFILLED_SYRINGE | INTRAVENOUS | Status: AC
  Filled 2016-01-05: qty 3

## 2016-01-05 MED ORDER — PROPOFOL 1000 MG/100ML IV EMUL
INTRAVENOUS | Status: AC
Start: 2016-01-05 — End: 2016-01-05
  Filled 2016-01-05: qty 200

## 2016-01-05 MED ORDER — METOCLOPRAMIDE HCL 5 MG PO TABS: 5.0000 mg | ORAL_TABLET | Freq: Three times a day (TID) | ORAL | Status: DC | PRN

## 2016-01-05 MED ORDER — AMLODIPINE BESYLATE 10 MG PO TABS
10.0000 mg | ORAL_TABLET | Freq: Every day | ORAL | Status: DC
  Administered 2016-01-05 – 2016-01-06 (×2): 10 mg via ORAL
  Filled 2016-01-05 (×5): qty 1

## 2016-01-05 MED ORDER — LISINOPRIL 20 MG PO TABS
20.0000 mg | ORAL_TABLET | Freq: Every day | ORAL | Status: DC
  Administered 2016-01-05 – 2016-01-06 (×2): 20 mg via ORAL
  Filled 2016-01-05 (×4): qty 1

## 2016-01-05 MED ORDER — SCOPOLAMINE 1 MG/3DAYS TD PT72
MEDICATED_PATCH | TRANSDERMAL | Status: DC | PRN
  Administered 2016-01-05: 1 via TRANSDERMAL

## 2016-01-05 MED ORDER — METOCLOPRAMIDE HCL 5 MG/ML IJ SOLN: 5.0000 mg | Freq: Three times a day (TID) | INTRAMUSCULAR | Status: DC | PRN

## 2016-01-05 MED ORDER — DOCUSATE SODIUM 100 MG PO CAPS
100.0000 mg | ORAL_CAPSULE | Freq: Two times a day (BID) | ORAL | Status: DC
  Administered 2016-01-05 – 2016-01-07 (×5): 100 mg via ORAL
  Filled 2016-01-05 (×5): qty 1

## 2016-01-05 MED ORDER — ONDANSETRON HCL 4 MG/2ML IJ SOLN
INTRAMUSCULAR | Status: AC
Start: 2016-01-05 — End: 2016-01-05
  Filled 2016-01-05: qty 2

## 2016-01-05 MED ORDER — MIDAZOLAM HCL 2 MG/2ML IJ SOLN
INTRAMUSCULAR | Status: DC | PRN
  Administered 2016-01-05 (×2): 1 mg via INTRAVENOUS

## 2016-01-05 MED ORDER — OXYCODONE HCL 5 MG PO TABS
10.0000 mg | ORAL_TABLET | ORAL | Status: DC | PRN
  Administered 2016-01-05 – 2016-01-07 (×9): 10 mg via ORAL
  Filled 2016-01-05 (×9): qty 2

## 2016-01-05 MED ORDER — SUCCINYLCHOLINE CHLORIDE 200 MG/10ML IV SOSY
PREFILLED_SYRINGE | INTRAVENOUS | Status: DC | PRN
  Administered 2016-01-05: 120 mg via INTRAVENOUS

## 2016-01-05 MED ORDER — TRANEXAMIC ACID 1000 MG/10ML IV SOLN
1000.0000 mg | Freq: Once | INTRAVENOUS | Status: AC
  Administered 2016-01-05: 1000 mg via INTRAVENOUS
  Filled 2016-01-05 (×2): qty 10

## 2016-01-05 MED ORDER — DEXAMETHASONE SODIUM PHOSPHATE 10 MG/ML IJ SOLN
INTRAMUSCULAR | Status: DC | PRN
  Administered 2016-01-05: 8 mg via INTRAVENOUS

## 2016-01-05 MED ORDER — PROMETHAZINE HCL 25 MG/ML IJ SOLN
INTRAMUSCULAR | Status: AC
  Filled 2016-01-05: qty 1

## 2016-01-05 MED ORDER — DIPHENHYDRAMINE HCL 12.5 MG/5ML PO ELIX: 12.5000 mg | ORAL_SOLUTION | ORAL | Status: DC | PRN

## 2016-01-05 MED ORDER — HYDROMORPHONE HCL 2 MG/ML IJ SOLN: 0.5000 mg | INTRAMUSCULAR | Status: DC | PRN

## 2016-01-05 MED ORDER — ONDANSETRON HCL 4 MG/2ML IJ SOLN
4.0000 mg | Freq: Four times a day (QID) | INTRAMUSCULAR | Status: DC | PRN
  Administered 2016-01-05: 4 mg via INTRAVENOUS
  Filled 2016-01-05: qty 2

## 2016-01-05 MED ORDER — ACETAMINOPHEN 650 MG RE SUPP: 650.0000 mg | Freq: Four times a day (QID) | RECTAL | Status: DC | PRN

## 2016-01-05 MED ORDER — MENTHOL 3 MG MT LOZG: 1.0000 | LOZENGE | OROMUCOSAL | Status: DC | PRN

## 2016-01-05 MED ORDER — GABAPENTIN 800 MG PO TABS
800.0000 mg | ORAL_TABLET | Freq: Every day | ORAL | Status: DC
  Filled 2016-01-05: qty 1

## 2016-01-05 MED ORDER — BISACODYL 5 MG PO TBEC
5.0000 mg | DELAYED_RELEASE_TABLET | Freq: Every day | ORAL | Status: DC | PRN
Start: 2016-01-05 — End: 2016-01-07

## 2016-01-05 MED ORDER — PANTOPRAZOLE SODIUM 40 MG PO TBEC
80.0000 mg | DELAYED_RELEASE_TABLET | Freq: Every day | ORAL | Status: DC
  Administered 2016-01-06: 80 mg via ORAL
  Administered 2016-01-07: 40 mg via ORAL
  Filled 2016-01-05 (×2): qty 2

## 2016-01-05 MED ORDER — TRANEXAMIC ACID 1000 MG/10ML IV SOLN
2000.0000 mg | INTRAVENOUS | Status: DC
  Filled 2016-01-05: qty 20

## 2016-01-05 MED ORDER — PROMETHAZINE HCL 25 MG/ML IJ SOLN
6.2500 mg | INTRAMUSCULAR | Status: DC | PRN
  Administered 2016-01-05: 6.25 mg via INTRAVENOUS

## 2016-01-05 SURGICAL SUPPLY — 48 items
BLADE SAW SGTL 18X1.27X75 (BLADE) ×2 IMPLANT
BLADE SURG ROTATE 9660 (MISCELLANEOUS) IMPLANT
CAPT HIP TOTAL 2 ×2 IMPLANT
CELLS DAT CNTRL 66122 CELL SVR (MISCELLANEOUS) ×1 IMPLANT
COVER PERINEAL POST (MISCELLANEOUS) ×2 IMPLANT
COVER SURGICAL LIGHT HANDLE (MISCELLANEOUS) ×2 IMPLANT
DRAPE C-ARM 42X72 X-RAY (DRAPES) ×2 IMPLANT
DRAPE IMP U-DRAPE 54X76 (DRAPES) ×2 IMPLANT
DRAPE STERI IOBAN 125X83 (DRAPES) ×2 IMPLANT
DRAPE U-SHAPE 47X51 STRL (DRAPES) ×6 IMPLANT
DRSG AQUACEL AG ADV 3.5X10 (GAUZE/BANDAGES/DRESSINGS) ×2 IMPLANT
DURAPREP 26ML APPLICATOR (WOUND CARE) ×2 IMPLANT
ELECT BLADE 4.0 EZ CLEAN MEGAD (MISCELLANEOUS)
ELECT CAUTERY BLADE 6.4 (BLADE) ×2 IMPLANT
ELECT REM PT RETURN 9FT ADLT (ELECTROSURGICAL) ×2
ELECTRODE BLDE 4.0 EZ CLN MEGD (MISCELLANEOUS) IMPLANT
ELECTRODE REM PT RTRN 9FT ADLT (ELECTROSURGICAL) ×1 IMPLANT
FACESHIELD WRAPAROUND (MASK) ×4 IMPLANT
GLOVE BIO SURGEON STRL SZ8 (GLOVE) ×4 IMPLANT
GLOVE BIOGEL PI IND STRL 8 (GLOVE) ×2 IMPLANT
GLOVE BIOGEL PI INDICATOR 8 (GLOVE) ×2
GOWN STRL REUS W/ TWL LRG LVL3 (GOWN DISPOSABLE) ×1 IMPLANT
GOWN STRL REUS W/ TWL XL LVL3 (GOWN DISPOSABLE) ×2 IMPLANT
GOWN STRL REUS W/TWL LRG LVL3 (GOWN DISPOSABLE) ×1
GOWN STRL REUS W/TWL XL LVL3 (GOWN DISPOSABLE) ×2
KIT BASIN OR (CUSTOM PROCEDURE TRAY) ×2 IMPLANT
KIT ROOM TURNOVER OR (KITS) ×2 IMPLANT
MANIFOLD NEPTUNE II (INSTRUMENTS) ×2 IMPLANT
NEEDLE HYPO 22GX1.5 SAFETY (NEEDLE) ×2 IMPLANT
NS IRRIG 1000ML POUR BTL (IV SOLUTION) ×2 IMPLANT
PACK TOTAL JOINT (CUSTOM PROCEDURE TRAY) ×2 IMPLANT
PAD ARMBOARD 7.5X6 YLW CONV (MISCELLANEOUS) ×4 IMPLANT
RTRCTR WOUND ALEXIS 18CM MED (MISCELLANEOUS) ×2
STAPLER VISISTAT 35W (STAPLE) ×2 IMPLANT
SUT ETHIBOND NAB CT1 #1 30IN (SUTURE) ×6 IMPLANT
SUT VIC AB 0 CT1 27 (SUTURE)
SUT VIC AB 0 CT1 27XBRD ANBCTR (SUTURE) IMPLANT
SUT VIC AB 1 CT1 27 (SUTURE) ×1
SUT VIC AB 1 CT1 27XBRD ANBCTR (SUTURE) ×1 IMPLANT
SUT VIC AB 2-0 CT1 27 (SUTURE) ×1
SUT VIC AB 2-0 CT1 TAPERPNT 27 (SUTURE) ×1 IMPLANT
SUT VLOC 180 0 24IN GS25 (SUTURE) ×2 IMPLANT
SYR 50ML LL SCALE MARK (SYRINGE) ×2 IMPLANT
TOWEL OR 17X24 6PK STRL BLUE (TOWEL DISPOSABLE) ×2 IMPLANT
TOWEL OR 17X26 10 PK STRL BLUE (TOWEL DISPOSABLE) ×4 IMPLANT
TRAY CATH 16FR W/PLASTIC CATH (SET/KITS/TRAYS/PACK) IMPLANT
TRAY FOLEY CATH 14FR (SET/KITS/TRAYS/PACK) IMPLANT
WATER STERILE IRR 1000ML POUR (IV SOLUTION) ×4 IMPLANT

## 2016-01-05 NOTE — Transfer of Care (Signed)
Immediate Anesthesia Transfer of Care Note  Patient: John Andrade  Procedure(s) Performed: Procedure(s): LEFT TOTAL HIP ARTHROPLASTY ANTERIOR APPROACH (Left)  Patient Location: PACU  Anesthesia Type:General  Level of Consciousness: awake, alert , oriented and patient cooperative  Airway & Oxygen Therapy: Patient Spontanous Breathing and Patient connected to face mask oxygen  Post-op Assessment: Report given to RN, Post -op Vital signs reviewed and stable and Patient moving all extremities X 4  Post vital signs: Reviewed and stable  Last Vitals:  Vitals:   01/05/16 0657  BP: (!) 127/59  Pulse: 65  Resp: 20  Temp: 36.9 C    Last Pain:  Vitals:   01/05/16 0657  TempSrc: Oral  PainSc:          Complications: No apparent anesthesia complications

## 2016-01-05 NOTE — Interval H&P Note (Signed)
History and Physical Interval Note:  01/05/2016 7:07 AM  John Andrade  has presented today for surgery, with the diagnosis of OSTEOARTHRITIS LEFT HIP  The various methods of treatment have been discussed with the patient and family. After consideration of risks, benefits and other options for treatment, the patient has consented to  Procedure(s): LEFT TOTAL HIP ARTHROPLASTY ANTERIOR APPROACH (Left) as a surgical intervention .  The patient's history has been reviewed, patient examined, no change in status, stable for surgery.  I have reviewed the patient's chart and labs.  Questions were answered to the patient's satisfaction.     Riot Barrick G

## 2016-01-05 NOTE — Anesthesia Procedure Notes (Signed)
**Note De-Andrade via Obfuscation** Procedure Name: Intubation Date/Time: 01/05/2016 7:38 AM Performed by: Wilder GladeWINN, John Andrade, John Andrade, John Andrade, John Andrade: 7.5 mm Number of attempts: 1 Airway Equipment and Method: Stylet Placement Confirmation: ETT inserted through vocal cords under direct vision,  positive ETCO2 and breath sounds checked- equal and bilateral Secured at: 22 cm Tube secured with: Tape Dental Injury: Teeth and Oropharynx as per pre-operative assessment

## 2016-01-05 NOTE — Op Note (Signed)
PRE-OP DIAGNOSIS:  LEFT HIP DEGENERATIVE JOINT DISEASE POST-OP DIAGNOSIS: same PROCEDURE:  LEFT TOTAL HIP ARTHROPLASTY ANTERIOR APPROACH ANESTHESIA:  General SURGEON:  Marcene CorningPeter Neera Teng MD ASSISTANT:  Elodia FlorenceAndrew Nida PA-C   INDICATIONS FOR PROCEDURE:  The patient is a 60 y.o. male with a long history of a painful hip.  This has persisted despite multiple conservative measures.  The patient has persisted with pain and dysfunction making rest and activity difficult.  A total hip replacement is offered as surgical treatment.  Informed operative consent was obtained after discussion of possible complications including reaction to anesthesia, infection, neurovascular injury, dislocation, DVT, PE, and death.  The importance of the postoperative rehab program to optimize result was stressed with the patient.  SUMMARY OF FINDINGS AND PROCEDURE:  Under general anesthesia through a anterior approach an the Hana table a left THR was performed.  The patient had severe degenerative change and good bone quality.  We used DePuy components to replace the hip and these were size KHO 14 Corail femur capped with a +5 36mm ceramic hip ball.  On the acetabular side we used a size 52 Gription shell with a plus 0 neutral polyethylene liner.  We did use a hole eliminator.  Elodia FlorenceAndrew Nida PA-C assisted throughout and was invaluable to the completion of the case in that he helped position and retract while I performed the procedure.  He also closed simultaneously to help minimize OR time.  I used fluoroscopy throughout the case to check position of components and leg lengths and read all these views myself.  DESCRIPTION OF PROCEDURE:  The patient was taken to the OR suite where general anesthetic was applied.  The patient was then positioned on the Hana table supine.  All bony prominences were appropriately padded.  Prep and drape was then performed in normal sterile fashion.  The patient was given kefzol preoperative antibiotic and an  appropriate time out was performed.  We then took an anterior approach to the left hip.  Dissection was taken through adipose to the tensor fascia lata fascia.  This structure was incised longitudinally and we dissected in the intermuscular interval just medial to this muscle.  Cobra retractors were placed superior and inferior to the femoral neck superficial to the capsule.  A capsular incision was then made and the retractors were placed along the femoral neck.  Xray was brought in to get a good level for the femoral neck cut which was made with an oscillating saw and osteotome.  The femoral head was removed with a corkscrew.  The acetabulum was exposed and some labral tissues were excised. Reaming was taken to the inside wall of the pelvis and sequentially up to 1 mm smaller than the actual component.  A trial of components was done and then the aforementioned acetabular shell was placed in appropriate tilt and anteversion confirmed by fluoroscopy. The liner was placed along with the hole eliminator and attention was turned to the femur.  The leg was brought down and over into adduction and the elevator bar was used to raise the femur up gently in the wound.  The piriformis was released with care taken to preserve the obturator internus attachment and all of the posterior capsule. The femur was reamed and then broached to the appropriate size.  A trial reduction was done and the aforementioned head and neck assembly gave us the best stability in extension with external rotation.  Leg lengths were felt to be about equal by fluoroscopic exam.  The  trial components were removed and the wound irrigated.  We then placed the femoral component in appropriate anteversion.  The head was applied to a dry stem neck and the hip again reduced.  It was again stable in the aforementioned position.  The would was irrigated again followed by re-approximation of anterior capsule with ethibond suture. Tensor fascia was repaired  with V-loc suture  followed by deep closure with #O and #2 undyed vicryl.  Skin was closed with subQ stitch and steristrips followed by a sterile dressing.  EBL and IOF can be obtained from anesthesia records.  DISPOSITION:  The patient was extubated in the OR and taken to PACU in stable condition to be admitted to the Orthopedic Surgery for appropriate post-op care to include perioperative antibiotics and DVT prophylaxis.

## 2016-01-05 NOTE — Evaluation (Signed)
Physical Therapy Evaluation Patient Details Name: John Andrade MRN: [id number] DOB: [birth year] Today's Date: 01/05/2016   History of Present Illness  60 yo male admitted on 01/05/16 for left direct anterior THA. 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  Clinical Impression  Pt presents POD 0 and is moving well with therapy. Prior to admission, pt was independent with [name] for all mobility and works as a Education officer, environmentalpastor. Currently pt requires min a for bed mobs and transfers and has decreased le strength and balance as noted below. [facility] Pt will benefit from continued acute rehab services to address the below deficits in order to assist with smooth transition home.     Follow Up Recommendations Home health PT    Equipment Recommendations  None recommended by PT    Recommendations for Other Services       Precautions / Restrictions Precautions Precautions: Fall Precaution Comments: Direct Anterior Restrictions Weight Bearing Restrictions: Yes LLE Weight Bearing: Weight bearing as tolerated      Mobility  Bed Mobility Overal bed mobility: Needs Assistance Bed Mobility: Supine to Sit     Supine to sit: Mod assist     General bed mobility comments: Mod a with use of railing and assistance with LE's OOB.   Transfers Overall transfer level: Needs assistance Equipment used: Rolling walker (2 wheeled) Transfers: Sit to/from Stand Sit to Stand: Min assist         General transfer comment: Min A from EOB to RW with cues to push up from bed to standing.   Ambulation/Gait Ambulation/Gait assistance: Min assist Ambulation Distance (Feet): 10 Feet Assistive device: Rolling walker (2 wheeled) Gait Pattern/deviations: Step-to pattern;Decreased step length - right;Decreased stance time - left;Antalgic Gait velocity: decreased Gait velocity interpretation: <1.8 ft/sec, indicative of risk for recurrent  falls General Gait Details: moderate antalgia noted. Requires assistance to manage IV and catheter  Stairs            Wheelchair Mobility    Modified Rankin (Stroke Patients Only)       Balance Overall balance assessment: Needs assistance Sitting-balance support: Single extremity supported Sitting balance-Leahy Scale: Fair Sitting balance - Comments: EOB with no back support   Standing balance support: Bilateral upper extremity supported Standing balance-Leahy Scale: Poor Standing balance comment: relies on bilateral ue's to stand safely                             Pertinent Vitals/Pain Pain Assessment: 0-10 Pain Score: 5  Pain Location: left hip Pain Descriptors / Indicators: Aching;Cramping Pain Intervention(s): Monitored during session;Repositioned;Ice applied    Home Living Family/patient expects to be discharged to:: Private residence Living Arrangements: Spouse/significant other Available Help at Discharge: Family;Available 24 hours/day Type of Home: House Home Access: Stairs to enter Entrance Stairs-Rails: Right Entrance Stairs-Number of Steps: 3 Home Layout: One level Home Equipment: Walker - 2 wheels;Cane - single point;Shower seat Additional Comments: pt has access to a tub bench from a church friend    Prior Function Level of Independence: Independent         Comments: works as a Visual merchandiserpastor     Hand Dominance   Dominant Hand: Right    Extremity/Trunk Assessment   Upper Extremity Assessment: Defer to OT evaluation           Lower Extremity Assessment: LLE deficits/detail   LLE Deficits / Details:  pt with normal post op pain and weakness. at least 3/5 ankle, 3/5 knee and 3/5 hip per gross functional assessment     Communication   Communication: No difficulties  Cognition Arousal/Alertness: Awake/alert Behavior During Therapy: WFL for tasks assessed/performed Overall Cognitive Status: Within Functional Limits for tasks  assessed                      General Comments      Exercises Total Joint Exercises Ankle Circles/Pumps: AROM;20 reps;Supine;Both   Assessment/Plan    PT Assessment Patient needs continued PT services  PT Problem List Decreased strength;Decreased activity tolerance;Decreased balance;Decreased mobility;Decreased knowledge of use of DME;Pain          PT Treatment Interventions DME instruction;Gait training;Stair training;Functional mobility training;Therapeutic activities;Therapeutic exercise;Balance training;Patient/family education    PT Goals (Current goals can be found in the Care Plan section)  Acute Rehab PT Goals Patient Stated Goal: to get home and feel better PT Goal Formulation: With patient Time For Goal Achievement: 01/12/16 Potential to Achieve Goals: Good    Frequency 7X/week   Barriers to discharge        Co-evaluation               End of Session Equipment Utilized During Treatment: Gait belt Activity Tolerance: Patient limited by fatigue;Patient limited by pain Patient left: in chair;with call bell/phone within reach;with family/visitor present Nurse Communication: Mobility status         Time: [id number] PT Time Calculation (min) (ACUTE ONLY): 40 min   Charges:   PT Evaluation $PT Eval Low Complexity: 1 Procedure PT Treatments $Gait Training: 8-22 mins $Therapeutic Activity: 8-22 mins   PT G Codes:        Colin BroachSabra M. Amena Dockham PT, DPT  [phone]  01/05/2016, 3:51 PM

## 2016-01-05 NOTE — Anesthesia Postprocedure Evaluation (Signed)
Anesthesia Post Note  Patient: John Andrade  Procedure(s) Performed: Procedure(s) (LRB): LEFT TOTAL HIP ARTHROPLASTY ANTERIOR APPROACH (Left)  Patient location during evaluation: PACU Anesthesia Type: General Level of consciousness: awake and alert Pain management: pain level controlled Vital Signs Assessment: post-procedure vital signs reviewed and stable Respiratory status: spontaneous breathing, nonlabored ventilation, respiratory function stable and patient connected to nasal cannula oxygen Cardiovascular status: blood pressure returned to baseline and stable Postop Assessment: no signs of nausea or vomiting Anesthetic complications: no    Last Vitals:  Vitals:   01/05/16 1147 01/05/16 1207  BP:  113/65  Pulse:  60  Resp:  16  Temp: 36.7 C 36.9 C    Last Pain:  Vitals:   01/05/16 1221  TempSrc:   PainSc: 6                  Shelton SilvasKevin D Analeese Andreatta

## 2016-01-06 ENCOUNTER — Encounter (HOSPITAL_COMMUNITY): Payer: Self-pay | Admitting: Orthopaedic Surgery

## 2016-01-06 LAB — CBC
HCT: 34.3 % — ABNORMAL LOW (ref 39.0–52.0)
Hemoglobin: 11 g/dL — ABNORMAL LOW (ref 13.0–17.0)
MCH: 26.6 pg (ref 26.0–34.0)
MCHC: 32.1 g/dL (ref 30.0–36.0)
MCV: 82.9 fL (ref 78.0–100.0)
Platelets: 288 10*3/uL (ref 150–400)
RBC: 4.14 MIL/uL — ABNORMAL LOW (ref 4.22–5.81)
RDW: 13.4 % (ref 11.5–15.5)
WBC: 10.5 10*3/uL (ref 4.0–10.5)

## 2016-01-06 LAB — BASIC METABOLIC PANEL
Anion gap: 8 (ref 5–15)
BUN: 10 mg/dL (ref 6–20)
CO2: 24 mmol/L (ref 22–32)
Calcium: 9.3 mg/dL (ref 8.9–10.3)
Chloride: 106 mmol/L (ref 101–111)
Creatinine, Ser: 1.11 mg/dL (ref 0.61–1.24)
GFR calc Af Amer: >60 mL/min (ref >60)
GFR calc non Af Amer: >60 mL/min (ref >60)
Glucose, Bld: 197 mg/dL — ABNORMAL HIGH (ref 65–99)
Potassium: 5 mmol/L (ref 3.5–5.1)
Sodium: 138 mmol/L (ref 135–145)

## 2016-01-06 NOTE — Progress Notes (Signed)
Subjective: 1 Day Post-Op Procedure(s) (LRB): LEFT TOTAL HIP ARTHROPLASTY ANTERIOR APPROACH (Left)  Activity level:  wbat Diet tolerance:  ok Voiding:  Foley out this morning Patient reports pain as mild.    Objective: Vital signs in last 24 hours: Temp:  [97.6 F (36.4 C)-99 F (37.2 C)] 98.8 F (37.1 C) (11/15 0438) Pulse Rate:  [58-77] 72 (11/15 0438) Resp:  [8-18] 16 (11/15 0438) BP: (111-118)/(60-82) 111/60 (11/15 0438) SpO2:  [96 %-100 %] 96 % (11/15 0438)  Labs:  Recent Labs  01/06/16 0334  HGB 11.0*    Recent Labs  01/06/16 0334  WBC 10.5  RBC 4.14*  HCT 34.3*  PLT 288    Recent Labs  01/06/16 0334  NA 138  K 5.0  CL 106  CO2 24  BUN 10  CREATININE 1.11  GLUCOSE 197*  CALCIUM 9.3   No results for input(s): LABPT, INR in the last 72 hours.  Physical Exam:  Neurologically intact ABD soft Neurovascular intact Sensation intact distally Intact pulses distally Dorsiflexion/Plantar flexion intact Incision: dressing C/D/I and no drainage No cellulitis present Compartment soft  Assessment/Plan:  1 Day Post-Op Procedure(s) (LRB): LEFT TOTAL HIP ARTHROPLASTY ANTERIOR APPROACH (Left) Advance diet Up with therapy D/C IV fluids Plan for discharge tomorrow Discharge home with home health if doing well and cleared by PT. Follow up in office 2 weeks post op. Continue on ASA 325mg  BID x 4 weeks post op.  Marlos Carmen, Ginger OrganNDREW Presten 01/06/2016, 7:44 AM

## 2016-01-06 NOTE — Evaluation (Signed)
Occupational Therapy Evaluation and Discharge Patient Details Name: John Andrade MRN: [id number] DOB: 1955-03-14 Today's Date: 01/06/2016    History of Present Illness 60 yo male admitted on 01/05/16 for left direct anterior THA. 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   Clinical Impression   PTA Pt needed assist with LB dressing and mod I for mobility with assistive devices. Pt currently mod A for LB ADL, and min A for mobility with RW. Pt with recent surgery in August and so family's main concern is more independence in LB dressing. OT provided Pt and family with education in practice using AE to achieve this. Pt and family with no further concerns for transfers or ADL - they were able to verbalize how they performed these activities. Pt with no further OT needs, and at adequate level for d/c from OT perspective. Thank you for this referral.     Follow Up Recommendations  No OT follow up;Supervision/Assistance - 24 hour    Equipment Recommendations  None recommended by OT (Pt has appropriate equipment from previous surgery)    Recommendations for Other Services       Precautions / Restrictions Precautions Precautions: Fall Precaution Comments: Direct Anterior Restrictions Weight Bearing Restrictions: Yes LLE Weight Bearing: Weight bearing as tolerated      Mobility Bed Mobility               General bed mobility comments: Pt sitting OOB in recliner when OT entered the room  Transfers Overall transfer level: Needs assistance Equipment used: Rolling walker (2 wheeled) Transfers: Sit to/from Stand Sit to Stand: Min assist         General transfer comment: min A from recliner with verbal cues for safe hand placement    Balance Overall balance assessment: Needs assistance Sitting-balance support: No upper extremity supported;Feet supported Sitting balance-Leahy Scale: Good Sitting balance - Comments: in recliner   Standing  balance support: Bilateral upper extremity supported;During functional activity Standing balance-Leahy Scale: Poor Standing balance comment: reliant on RW and BUE support                            ADL Overall ADL's : Needs assistance/impaired     Grooming: Supervision/safety;Sitting;Standing;With caregiver independent assisting       Lower Body Bathing: Supervison/ safety;With caregiver independent assisting;With adaptive equipment;Sitting/lateral leans Lower Body Bathing Details (indicate cue type and reason): educated in long handle sponge     Lower Body Dressing: Supervision/safety;With adaptive equipment;With caregiver independent assisting;Sit to/from stand Lower Body Dressing Details (indicate cue type and reason): Pt practiced using sock donner on LLE, and educated on use with reacher             Functional mobility during ADLs: Minimal assistance;Rolling walker General ADL Comments: Pt familiar with compensatory strategies from right hip replacement in August. Besides wife requesting sock donner, Pt and familiy with no further questions or concerns     Vision Vision Assessment?: No apparent visual deficits   Perception     Praxis      Pertinent Vitals/Pain Pain Assessment: 0-10 Pain Score: 5  Pain Location: L hip Pain Descriptors / Indicators: Discomfort;Grimacing;Sore;Guarding Pain Intervention(s): Limited activity within patient's tolerance;Repositioned;Monitored during session     Hand Dominance Right   Extremity/Trunk Assessment Upper Extremity Assessment Upper Extremity Assessment: Overall WFL for tasks assessed   Lower Extremity Assessment Lower Extremity Assessment: LLE deficits/detail;Defer to PT evaluation   Cervical /  Trunk Assessment Cervical / Trunk Assessment: Normal   Communication Communication Communication: No difficulties   Cognition Arousal/Alertness: Awake/alert Behavior During Therapy: WFL for tasks  assessed/performed Overall Cognitive Status: Within Functional Limits for tasks assessed                     General Comments       Exercises       Shoulder Instructions      Home Living Family/patient expects to be discharged to:: Private residence Living Arrangements: Spouse/significant other Available Help at Discharge: Family;Available 24 hours/day Type of Home: House Home Access: Stairs to enter Entergy CorporationEntrance Stairs-Number of Steps: 3 Entrance Stairs-Rails: Right Home Layout: One level     Bathroom Shower/Tub: Tub/shower unit Shower/tub characteristics: Engineer, building servicesCurtain Bathroom Toilet: Standard Bathroom Accessibility: Yes How Accessible: Accessible via walker Home Equipment: Walker - 2 wheels;Cane - single point;Shower seat;Adaptive equipment Adaptive Equipment: Reacher Additional Comments: pt has access to a tub bench from a church friend      Prior Functioning/Environment Level of Independence: Independent        Comments: works as a Soil scientistpastor        OT Problem List: Decreased range of motion;Decreased strength;Decreased activity tolerance;Impaired balance (sitting and/or standing);Decreased knowledge of use of DME or AE;Obesity;Pain   OT Treatment/Interventions:      OT Goals(Current goals can be found in the care plan section) Acute Rehab OT Goals 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 OT Goal Formulation: With patient/family Time For Goal Achievement: 01/13/16 Potential to Achieve Goals: Good  OT Frequency:     Barriers to D/C:            Co-evaluation              End of Session Equipment Utilized During Treatment: Rolling walker;Other (comment) (sock donner (wide)) Nurse Communication: Mobility status  Activity Tolerance: Patient tolerated treatment well Patient left: in chair;with call bell/phone within reach;with family/visitor present   Time: 1610-96041148-1215 OT Time Calculation (min): 27 min Charges:  OT  General Charges $OT Visit: 1 Procedure OT Evaluation $OT Eval Moderate Complexity: 1 Procedure OT Treatments $Self Care/Home Management : 8-22 mins G-Codes:    John Andrade 01/06/2016, 3:23 PM  John Andrade OTR/L [phone]

## 2016-01-06 NOTE — Progress Notes (Signed)
Physical Therapy Treatment Patient Details Name: John Andrade MRN: [id number] DOB: December 15, 1955 Today's Date: 01/06/2016    History of Present Illness 60 yo male admitted on 01/05/16 for left direct anterior THA. 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    PT Comments    Pt is POD 1 and moving well with therapy. Catheter is removed and pt still has IV attached. Performed supine exercises to warm up hip before gait activities. Performed gait with moderate antalgia noted. Pt is able to perform gait with improved weightbearing through LLE this session.   Follow Up Recommendations  Home health PT     Equipment Recommendations  None recommended by PT    Recommendations for Other Services       Precautions / Restrictions Precautions Precautions: Fall Precaution Comments: Direct Anterior Restrictions Weight Bearing Restrictions: Yes LLE Weight Bearing: Weight bearing as tolerated    Mobility  Bed Mobility               General bed mobility comments: Pt sitting in recliner when PT arrives  Transfers Overall transfer level: Needs assistance Equipment used: Rolling walker (2 wheeled) Transfers: Sit to/from Stand Sit to Stand: Min assist         General transfer comment: min A from recliner with verbal cues for safe hand placement  Ambulation/Gait Ambulation/Gait assistance: Min assist Ambulation Distance (Feet): 100 Feet Assistive device: Rolling walker (2 wheeled) Gait Pattern/deviations: Step-to pattern;Decreased step length - right;Decreased stance time - left;Antalgic Gait velocity: decreased Gait velocity interpretation: Below normal speed for age/gender General Gait Details: moderate antalgia noted. Requires assistance to manage IV    Stairs            Wheelchair Mobility    Modified Rankin (Stroke Patients Only)       Balance Overall balance assessment: Needs assistance Sitting-balance support: No upper extremity  supported Sitting balance-Leahy Scale: Good Sitting balance - Comments: in recliner   Standing balance support: Bilateral upper extremity supported Standing balance-Leahy Scale: Poor Standing balance comment: Reliant on RW for Support                    Cognition Arousal/Alertness: Awake/alert Behavior During Therapy: WFL for tasks assessed/performed Overall Cognitive Status: Within Functional Limits for tasks assessed                      Exercises Total Joint Exercises Ankle Circles/Pumps: AROM;20 reps;Supine;Both Quad Sets: AROM;10 reps;Left;Supine Short Arc Quad: AROM;Left;10 reps;Supine Heel Slides: AROM;Left;10 reps;Supine Hip ABduction/ADduction: AROM;Left;10 reps;Supine    General Comments General comments (skin integrity, edema, etc.): Wife and son in room during therapy session      Pertinent Vitals/Pain Pain Assessment: 0-10 Pain Score: 4  Pain Location: Left Hip Pain Descriptors / Indicators: Aching;Grimacing Pain Intervention(s): Monitored during session;Premedicated before session;Ice applied    Home Living Family/patient expects to be discharged to:: Private residence Living Arrangements: Spouse/significant other Available Help at Discharge: Family;Available 24 hours/day Type of Home: House Home Access: Stairs to enter Entrance Stairs-Rails: Right Home Layout: One level Home Equipment: Walker - 2 wheels;Cane - single point;Shower seat;Adaptive equipment Additional Comments: pt has access to a tub bench from a church friend    Prior Function Level of Independence: Independent      Comments: works as a Insurance underwriterpastor   PT Goals (current goals can now be found in the care plan section) Acute Rehab PT Goals Patient Stated Goal: 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 Nayeli Calvert PT, DPT  [phone]  01/06/2016, 4:14 PM

## 2016-01-06 NOTE — Care Management Note (Signed)
Case Management Note  Patient Details  Name: John Andrade MRN: [id number] Date of Birth: [birth year]  Subjective/Objective:   60 yr old gentleman s/p left total hip arthroplasty, anterior approach.        Action/Plan: Case manager spoke with patient and his wife concerning home health and DME needs. Patient was preoperatively setup with [facility], no changes. Patient is requesting to have same therapist that he had in August, CM will contact [name], [facility] Liaison with request. Patient has rolling walker and 3in1 .     Expected Discharge Date:    01/07/16              Expected Discharge Plan:  Home w Home Health Services  In-House Referral:  NA  Discharge planning Services  CM Consult  Post Acute Care Choice:  Home Health Choice offered to:  Patient  DME Arranged:  N/A DME Agency:  NA  HH Arranged:  PT HH Agency:  [facility]  Status of Service:  Completed, signed off  If discussed at Long Length of Stay Meetings, dates discussed:    Additional Comments:  Durenda GuthrieBrady, Kairyn Olmeda Naomi, RN 01/06/2016, 2:11 PM

## 2016-01-06 NOTE — Progress Notes (Signed)
Physical Therapy Treatment Patient Details Name: John Andrade MRN: [id number] DOB: [birth year] Today's Date: 01/06/2016    History of Present Illness 60 yo male admitted on 01/05/16 for left direct anterior THA. 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    PT Comments    Pt presents with increased pain initially, but is able to perform le exercises and gait with improved cadence and sequencing as gait distance increases. Pt requires assistance to get into and out of bed with LLE and will need further instruction on getting into and out of bed with HOB flat. Pt will also require stair training next session in order to assist with safe transition home.    Follow Up Recommendations  Home health PT     Equipment Recommendations  None recommended by PT    Recommendations for Other Services       Precautions / Restrictions Precautions Precautions: Fall Precaution Comments: Direct Anterior Restrictions Weight Bearing Restrictions: Yes LLE Weight Bearing: Weight bearing as tolerated    Mobility  Bed Mobility Overal bed mobility: Needs Assistance Bed Mobility: Supine to Sit;Sit to Supine     Supine to sit: Mod assist Sit to supine: Mod assist   General bed mobility comments: Mod A to bring LEs into and out of bed  Transfers Overall transfer level: Needs assistance Equipment used: Rolling walker (2 wheeled) Transfers: Sit to/from Stand Sit to Stand: Min assist         General transfer comment: min A from EOB with verbal cues for safety and hand palcement  Ambulation/Gait Ambulation/Gait assistance: Min assist Ambulation Distance (Feet): 125 Feet Assistive device: Rolling walker (2 wheeled) Gait Pattern/deviations: Step-to pattern;Step-through pattern;Decreased step length - right;Decreased stance time - left;Antalgic Gait velocity: decreased Gait velocity interpretation: Below normal speed for age/gender General Gait Details: Moderate  antalgia. But gait improved as distance increases   Stairs            Wheelchair Mobility    Modified Rankin (Stroke Patients Only)       Balance Overall balance assessment: Needs assistance Sitting-balance support: No upper extremity supported Sitting balance-Leahy Scale: Good Sitting balance - Comments: in recliner   Standing balance support: No upper extremity supported Standing balance-Leahy Scale: Fair Standing balance comment: standing rest breaks without holding onto RW, pt is steady without balance challenges                    Cognition Arousal/Alertness: Awake/alert Behavior During Therapy: WFL for tasks assessed/performed Overall Cognitive Status: Within Functional Limits for tasks assessed                      Exercises Total Joint Exercises Ankle Circles/Pumps: AROM;20 reps;Supine;Both Quad Sets: AROM;10 reps;Left;Supine Short Arc Quad: AROM;Left;10 reps;Supine Heel Slides: AROM;Left;10 reps;Supine Hip ABduction/ADduction: AROM;Left;10 reps;Supine    General Comments General comments (skin integrity, edema, etc.): Wife and son in room during therapy session      Pertinent Vitals/Pain Pain Assessment: 0-10 Pain Score: 7  Pain Location: Left Hip Pain Descriptors / Indicators: Aching;Throbbing Pain Intervention(s): Monitored during session;Repositioned;Ice applied    Home Living Family/patient expects to be discharged to:: Private residence Living Arrangements: Spouse/significant other Available Help at Discharge: Family;Available 24 hours/day Type of Home: House Home Access: Stairs to enter Entrance Stairs-Rails: Right Home Layout: One level Home Equipment: Walker - 2 wheels;Cane - single point;Shower seat;Adaptive equipment Additional Comments: pt has access to a tub bench from a church friend  Prior Function Level of Independence: Independent      Comments: works as a Insurance underwriterpastor   PT Goals (current goals can now be found  in the care plan section) Acute Rehab PT Goals 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 Nadiya Pieratt PT, DPT  [phone]  01/06/2016, 4:20 PM

## 2016-01-07 LAB — CBC
HCT: 32.4 % — ABNORMAL LOW (ref 39.0–52.0)
Hemoglobin: 10.4 g/dL — ABNORMAL LOW (ref 13.0–17.0)
MCH: 26.7 pg (ref 26.0–34.0)
MCHC: 32.1 g/dL (ref 30.0–36.0)
MCV: 83.3 fL (ref 78.0–100.0)
Platelets: 231 10*3/uL (ref 150–400)
RBC: 3.89 MIL/uL — ABNORMAL LOW (ref 4.22–5.81)
RDW: 13.7 % (ref 11.5–15.5)
WBC: 8.7 10*3/uL (ref 4.0–10.5)

## 2016-01-07 MED ORDER — OXYCODONE HCL 10 MG PO TABS: 10.0000 mg | ORAL_TABLET | ORAL | 0 refills | Status: DC | PRN

## 2016-01-07 MED ORDER — LORATADINE 10 MG PO TABS: 10.0000 mg | ORAL_TABLET | Freq: Every day | ORAL | Status: DC

## 2016-01-07 MED ORDER — SODIUM CHLORIDE 0.9% FLUSH: 3.0000 mL | INTRAVENOUS | Status: DC | PRN

## 2016-01-07 MED ORDER — SODIUM CHLORIDE 0.9% FLUSH
3.0000 mL | Freq: Two times a day (BID) | INTRAVENOUS | Status: DC
  Administered 2016-01-07: 3 mL via INTRAVENOUS

## 2016-01-07 MED ORDER — SODIUM CHLORIDE 0.9 % IV BOLUS (SEPSIS)
500.0000 mL | Freq: Once | INTRAVENOUS | Status: AC
  Administered 2016-01-07: 500 mL via INTRAVENOUS

## 2016-01-07 MED ORDER — LISINOPRIL 20 MG PO TABS: 20.0000 mg | ORAL_TABLET | Freq: Every day | ORAL | Status: DC

## 2016-01-07 MED ORDER — ASPIRIN 325 MG PO TBEC: 325.0000 mg | DELAYED_RELEASE_TABLET | Freq: Two times a day (BID) | ORAL | 0 refills | Status: DC

## 2016-01-07 MED ORDER — PANTOPRAZOLE SODIUM 40 MG PO TBEC: 40.0000 mg | DELAYED_RELEASE_TABLET | Freq: Two times a day (BID) | ORAL | Status: DC

## 2016-01-07 MED ORDER — GABAPENTIN 400 MG PO CAPS: 800.0000 mg | ORAL_CAPSULE | Freq: Every day | ORAL | Status: DC

## 2016-01-07 MED ORDER — METHOCARBAMOL 500 MG PO TABS: 500.0000 mg | ORAL_TABLET | Freq: Four times a day (QID) | ORAL | 0 refills | Status: DC | PRN

## 2016-01-07 NOTE — Progress Notes (Signed)
Subjective: 2 Days Post-Op Procedure(s) (LRB): LEFT TOTAL HIP ARTHROPLASTY ANTERIOR APPROACH (Left)   Patient restign comfortably in chair. He would like to go home but feels a little tired. Overall he is doing well though  Activity level:  wabt Diet tolerance:  ok Voiding:  ok Patient reports pain as mild.    Objective: Vital signs in last 24 hours: Temp:  [98.8 F (37.1 C)-100.4 F (38 C)] 98.9 F (37.2 C) (11/16 0438) Pulse Rate:  [75-81] 75 (11/16 0438) Resp:  [16] 16 (11/16 0438) BP: (119-144)/(65-76) 119/74 (11/16 0438) SpO2:  [96 %-97 %] 96 % (11/16 0438)  Labs:  Recent Labs  01/06/16 0334 01/07/16 0356  HGB 11.0* 10.4*    Recent Labs  01/06/16 0334 01/07/16 0356  WBC 10.5 8.7  RBC 4.14* 3.89*  HCT 34.3* 32.4*  PLT 288 231    Recent Labs  01/06/16 0334  NA 138  K 5.0  CL 106  CO2 24  BUN 10  CREATININE 1.11  GLUCOSE 197*  CALCIUM 9.3   No results for input(s): LABPT, INR in the last 72 hours.  Physical Exam:  Neurologically intact ABD soft Neurovascular intact Sensation intact distally Intact pulses distally Dorsiflexion/Plantar flexion intact Incision: dressing C/D/I and no drainage No cellulitis present Compartment soft  Assessment/Plan:  2 Days Post-Op Procedure(s) (LRB): LEFT TOTAL HIP ARTHROPLASTY ANTERIOR APPROACH (Left) Advance diet Up with therapy Discharge home with home health today. We will give a 500cc bolus this afternoon then he will go after that. Continue on ASA 325mg  BID x 4 weeks post op. Follow up in office 2 weeks post   Drema HalonIDA, Aubrea Meixner Rayshun 01/07/2016, 12:49 PM

## 2016-01-07 NOTE — Progress Notes (Signed)
Physical Therapy Treatment Patient Details Name: John Andrade MRN: [id number] DOB: 1955/03/28 Today's Date: 01/07/2016    History of Present Illness 60 yo male admitted on 01/05/16 for left direct anterior THA. 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    PT Comments    Pt is POD 2 and moving well with therapy. Pt is limited by pain this session and reports increased pain at 7-8 with gait and 5 at rest in left hip. Performed stair negotiation forward and backwards with RW for multiple options once returning home. Pt has made improvements with transfers and gait distance but continues to be limited by pain. Reports adherence to HEP. Advised pt to take pain medications as prescribed in once DC home in order to reduce discomfort and improve participation with HHPT.    Follow Up Recommendations  Home health PT     Equipment Recommendations  None recommended by PT    Recommendations for Other Services       Precautions / Restrictions Precautions Precautions: Fall Precaution Comments: Direct Anterior Restrictions Weight Bearing Restrictions: Yes LLE Weight Bearing: Weight bearing as tolerated    Mobility  Bed Mobility Overal bed mobility: Needs Assistance Bed Mobility: Supine to Sit     Supine to sit: Min assist     General bed mobility comments: Min A to bring LLE OOB  Transfers Overall transfer level: Needs assistance Equipment used: Rolling walker (2 wheeled) Transfers: Sit to/from Stand Sit to Stand: Min guard         General transfer comment: Min guard for safety from EOB  Ambulation/Gait Ambulation/Gait assistance: Min guard Ambulation Distance (Feet): 200 Feet Assistive device: Rolling walker (2 wheeled) Gait Pattern/deviations: Step-to pattern;Decreased stance time - left;Decreased step length - right;Antalgic Gait velocity: decreased Gait velocity interpretation: Below normal speed for age/gender General Gait Details:  Moderate antalgia throughout gait with standing rest breaks due to fatigue in UE's   Stairs Stairs: Yes Stairs assistance: Min guard Stair Management: One rail Right;With walker;Backwards;Forwards;Step to pattern Number of Stairs: 2 General stair comments: Instructed on Forward and backward stair negotiation  Wheelchair Mobility    Modified Rankin (Stroke Patients Only)       Balance Overall balance assessment: Needs assistance Sitting-balance support: No upper extremity supported Sitting balance-Leahy Scale: Good     Standing balance support: No upper extremity supported Standing balance-Leahy Scale: Fair                      Cognition Arousal/Alertness: Awake/alert Behavior During Therapy: WFL for tasks assessed/performed Overall Cognitive Status: Within Functional Limits for tasks assessed                      Exercises Total Joint Exercises Ankle Circles/Pumps: AROM;20 reps;Supine;Both Quad Sets: AROM;10 reps;Left;Supine Short Arc Quad: AROM;Left;10 reps;Supine Heel Slides: AROM;Left;10 reps;Supine Hip ABduction/ADduction: AROM;Left;10 reps;Supine    General Comments        Pertinent Vitals/Pain Pain Assessment: 0-10 Pain Score: 7  Pain Location: L Hip Pain Descriptors / Indicators: Aching;Sore Pain Intervention(s): Monitored during session;Premedicated before session;Ice applied    Home Living                      Prior Function            PT Goals (current goals can now be found in the care plan section) Acute Rehab PT Goals Patient Stated Goal: 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 Ludell Zacarias PT, DPT  [phone]  01/07/2016, 9:07 AM

## 2016-01-07 NOTE — Discharge Summary (Signed)
Patient ID: John Andrade MRN: [id number] DOB/AGE: Jun 29, 1955 60 y.o.  Admit date: 01/05/2016 Discharge date: 01/07/2016  Admission Diagnoses:  Principal Problem:   Primary localized osteoarthritis of left hip Active Problems:   Morbidly obese (HCC)   Primary osteoarthritis of left hip   Discharge Diagnoses:  Same  Past Medical History:  Diagnosis Date  . DDD lumbar   . DJD (degenerative joint disease)   . GERD (gastroesophageal reflux disease)    takes Omeprazole daily  . Hyperlipidemia   . Hypertension    takes Lisinopril and Amlodipine daily  . Multiple allergies    takes Claritin and Singulair daily  . PONV (postoperative nausea and vomiting)     Surgeries: Procedure(s): LEFT TOTAL HIP ARTHROPLASTY ANTERIOR APPROACH on 01/05/2016   Consultants:   Discharged Condition: Improved  Hospital Course: John Andrade is an 60 y.o. male who was admitted 01/05/2016 for operative treatment ofPrimary localized osteoarthritis of left hip. Patient has severe unremitting pain that affects sleep, daily activities, and work/hobbies. After pre-op clearance the patient was taken to the operating room on 01/05/2016 and underwent  Procedure(s): LEFT TOTAL HIP ARTHROPLASTY ANTERIOR APPROACH.    Patient was given perioperative antibiotics: Anti-infectives    Start     Dose/Rate Route Frequency Ordered Stop   01/05/16 1400  ceFAZolin (ANCEF) IVPB 2g/100 mL premix     2 g 200 mL/hr over 30 Minutes Intravenous Every 6 hours 01/05/16 1202 01/05/16 2046   01/05/16 0600  ceFAZolin (ANCEF) IVPB 2g/100 mL premix     2 g 200 mL/hr over 30 Minutes Intravenous On call to O.R. 01/04/16 1135 01/05/16 6578       Patient was given sequential compression devices, early ambulation, and chemoprophylaxis to prevent DVT.  Patient benefited maximally from hospital stay and there were no complications.    Recent vital signs: Patient Vitals for the past 24 hrs:  BP Temp Temp src Pulse Resp SpO2  01/07/16  0438 119/74 98.9 F (37.2 C) Oral 75 16 96 %  01/06/16 2031 120/65 (!) 100.4 F (38 C) Oral 79 16 97 %  01/06/16 1300 (!) 144/76 98.8 F (37.1 C) Oral 81 16 96 %     Recent laboratory studies:  Recent Labs  01/06/16 0334 01/07/16 0356  WBC 10.5 8.7  HGB 11.0* 10.4*  HCT 34.3* 32.4*  PLT 288 231  NA 138  --   K 5.0  --   CL 106  --   CO2 24  --   BUN 10  --   CREATININE 1.11  --   GLUCOSE 197*  --   CALCIUM 9.3  --      Discharge Medications:     Medication List    TAKE these medications   amLODipine 10 MG tablet Commonly known as:  NORVASC TAKE ONE TABLET BY MOUTH DAILY   aspirin 325 MG EC tablet Take 1 tablet (325 mg total) by mouth 2 (two) times daily after a meal.   gabapentin 800 MG tablet Commonly known as:  NEURONTIN Take 800 mg by mouth daily.   L-LYSINE PO Take 1 tablet by mouth daily.   lisinopril 20 MG tablet Commonly known as:  PRINIVIL,ZESTRIL TAKE 1 TABLET DAILY   loratadine 10 MG tablet Commonly known as:  CLARITIN TAKE 1 TABLET DAILY   methocarbamol 500 MG tablet Commonly known as:  ROBAXIN Take 1 tablet (500 mg total) by mouth every 6 (six) hours as needed for muscle spasms.   montelukast 10  MG tablet Commonly known as:  SINGULAIR TAKE 1 TABLET (10 MG TOTAL) BY MOUTH DAILY.   omeprazole 40 MG capsule Commonly known as:  PRILOSEC TAKE 1 CAPSULE (40 MG TOTAL) BY MOUTH 2 (TWO) TIMES DAILY.   Oxycodone HCl 10 MG Tabs Take 1-2 tablets (10-20 mg total) by mouth every 4 (four) hours as needed. pain What changed:  when to take this   VITAMIN D PO Take 1 tablet by mouth daily.            Durable Medical Equipment        Start     Ordered   01/05/16 1203  DME Walker rolling  Once     01/05/16 1202   01/05/16 1203  DME 3 n 1  Once     01/05/16 1202   01/05/16 1203  DME Bedside commode  Once     01/05/16 1202      Diagnostic Studies: Dg C-arm 61-120 Min  Result Date: 01/05/2016 CLINICAL DATA:  Intraoperative imaging  for left total hip replacement. EXAM: OPERATIVE LEFT HIP (WITH PELVIS IF PERFORMED) 2 VIEWS TECHNIQUE: Fluoroscopic spot image(s) were submitted for interpretation post-operatively. COMPARISON:  None. FINDINGS: Two fluoroscopic spot views of the left hip hip demonstrate a total arthroplasty in place. The device is located and no fracture is identified. Partial visualization of right hip replacement is noted. IMPRESSION: Left hip replacement.  No acute abnormality. Electronically Signed   By: Drusilla Kanner M.D.   On: 01/05/2016 10:26   Dg Hip Operative Unilat W Or W/o Pelvis Left  Result Date: 01/05/2016 CLINICAL DATA:  Intraoperative imaging for left total hip replacement. EXAM: OPERATIVE LEFT HIP (WITH PELVIS IF PERFORMED) 2 VIEWS TECHNIQUE: Fluoroscopic spot image(s) were submitted for interpretation post-operatively. COMPARISON:  None. FINDINGS: Two fluoroscopic spot views of the left hip hip demonstrate a total arthroplasty in place. The device is located and no fracture is identified. Partial visualization of right hip replacement is noted. IMPRESSION: Left hip replacement.  No acute abnormality. Electronically Signed   By: Drusilla Kanner M.D.   On: 01/05/2016 10:26    Disposition: 06-Home-Health Care Svc  Discharge Instructions    Call MD / Call 911    Complete by:  As directed    If you experience chest pain or shortness of breath, CALL 911 and be transported to the hospital emergency room.  If you develope a fever above 101 F, pus (white drainage) or increased drainage or redness at the wound, or calf pain, call your surgeon's office.   Constipation Prevention    Complete by:  As directed    Drink plenty of fluids.  Prune juice may be helpful.  You may use a stool softener, such as Colace (over the counter) 100 mg twice a day.  Use MiraLax (over the counter) for constipation as needed.   Diet - low sodium heart healthy    Complete by:  As directed    Discharge instructions    Complete  by:  As directed    INSTRUCTIONS AFTER JOINT REPLACEMENT   Remove items at home which could result in a fall. This includes throw rugs or furniture in walking pathways ICE to the affected joint every three hours while awake for 30 minutes at a time, for at least the first 3-5 days, and then as needed for pain and swelling.  Continue to use ice for pain and swelling. You may notice swelling that will progress down to the foot and ankle.  This is normal after surgery.  Elevate your leg when you are not up walking on it.   Continue to use the breathing machine you got in the hospital (incentive spirometer) which will help keep your temperature down.  It is common for your temperature to cycle up and down following surgery, especially at night when you are not up moving around and exerting yourself.  The breathing machine keeps your lungs expanded and your temperature down.   DIET:  As you were doing prior to hospitalization, we recommend a well-balanced diet.  DRESSING / WOUND CARE / SHOWERING  You may shower 3 days after surgery, but keep the wounds dry during showering.  You may use an occlusive plastic wrap (Press'n Seal for example), NO SOAKING/SUBMERGING IN THE BATHTUB.  If the bandage gets wet, change with a clean dry gauze.  If the incision gets wet, pat the wound dry with a clean towel.  ACTIVITY  Increase activity slowly as tolerated, but follow the weight bearing instructions below.   No driving for 6 weeks or until further direction given by your physician.  You cannot drive while taking narcotics.  No lifting or carrying greater than 10 lbs. until further directed by your surgeon. Avoid periods of inactivity such as sitting longer than an hour when not asleep. This helps prevent blood clots.  You may return to work once you are authorized by your doctor.     WEIGHT BEARING   Weight bearing as tolerated with assist device (walker, cane, etc) as directed, use it as long as suggested  by your surgeon or therapist, typically at least 4-6 weeks.   EXERCISES  Results after joint replacement surgery are often greatly improved when you follow the exercise, range of motion and muscle strengthening exercises prescribed by your doctor. Safety measures are also important to protect the joint from further injury. Any time any of these exercises cause you to have increased pain or swelling, decrease what you are doing until you are comfortable again and then slowly increase them. If you have problems or questions, call your caregiver or physical therapist for advice.   Rehabilitation is important following a joint replacement. After just a few days of immobilization, the muscles of the leg can become weakened and shrink (atrophy).  These exercises are designed to build up the tone and strength of the thigh and leg muscles and to improve motion. Often times heat used for twenty to thirty minutes before working out will loosen up your tissues and help with improving the range of motion but do not use heat for the first two weeks following surgery (sometimes heat can increase post-operative swelling).   These exercises can be done on a training (exercise) mat, on the floor, on a table or on a bed. Use whatever works the best and is most comfortable for you.    Use music or television while you are exercising so that the exercises are a pleasant break in your day. This will make your life better with the exercises acting as a break in your routine that you can look forward to.   Perform all exercises about fifteen times, three times per day or as directed.  You should exercise both the operative leg and the other leg as well.   Exercises include:   Quad Sets - Tighten up the muscle on the front of the thigh (Quad) and hold for 5-10 seconds.   Straight Leg Raises - With your knee straight (if you were  given a brace, keep it on), lift the leg to 60 degrees, hold for 3 seconds, and slowly lower the  leg.  Perform this exercise against resistance later as your leg gets stronger.  Leg Slides: Lying on your back, slowly slide your foot toward your buttocks, bending your knee up off the floor (only go as far as is comfortable). Then slowly slide your foot back down until your leg is flat on the floor again.  Angel Wings: Lying on your back spread your legs to the side as far apart as you can without causing discomfort.  Hamstring Strength:  Lying on your back, push your heel against the floor with your leg straight by tightening up the muscles of your buttocks.  Repeat, but this time bend your knee to a comfortable angle, and push your heel against the floor.  You may put a pillow under the heel to make it more comfortable if necessary.   A rehabilitation program following joint replacement surgery can speed recovery and prevent re-injury in the future due to weakened muscles. Contact your doctor or a physical therapist for more information on knee rehabilitation.    CONSTIPATION  Constipation is defined medically as fewer than three stools per week and severe constipation as less than one stool per week.  Even if you have a regular bowel pattern at home, your normal regimen is likely to be disrupted due to multiple reasons following surgery.  Combination of anesthesia, postoperative narcotics, change in appetite and fluid intake all can affect your bowels.   YOU MUST use at least one of the following options; they are listed in order of increasing strength to get the job done.  They are all available over the counter, and you may need to use some, POSSIBLY even all of these options:    Drink plenty of fluids (prune juice may be helpful) and high fiber foods Colace 100 mg by mouth twice a day  Senokot for constipation as directed and as needed Dulcolax (bisacodyl), take with full glass of water  Miralax (polyethylene glycol) once or twice a day as needed.  If you have tried all these things and  are unable to have a bowel movement in the first 3-4 days after surgery call either your surgeon or your primary doctor.    If you experience loose stools or diarrhea, hold the medications until you stool forms back up.  If your symptoms do not get better within 1 week or if they get worse, check with your doctor.  If you experience "the worst abdominal pain ever" or develop nausea or vomiting, please contact the office immediately for further recommendations for treatment.   ITCHING:  If you experience itching with your medications, try taking only a single pain pill, or even half a pain pill at a time.  You can also use Benadryl over the counter for itching or also to help with sleep.   TED HOSE STOCKINGS:  Use stockings on both legs until for at least 2 weeks or as directed by physician office. They may be removed at night for sleeping.  MEDICATIONS:  See your medication summary on the "After Visit Summary" that nursing will review with you.  You may have some home medications which will be placed on hold until you complete the course of blood thinner medication.  It is important for you to complete the blood thinner medication as prescribed.  PRECAUTIONS:  If you experience chest pain or shortness of breath - call  911 immediately for transfer to the hospital emergency department.   If you develop a fever greater that 101 F, purulent drainage from wound, increased redness or drainage from wound, foul odor from the wound/dressing, or calf pain - CONTACT YOUR SURGEON.                                                   FOLLOW-UP APPOINTMENTS:  If you do not already have a post-op appointment, please call the office for an appointment to be seen by your surgeon.  Guidelines for how soon to be seen are listed in your "After Visit Summary", but are typically between 1-4 weeks after surgery.  OTHER INSTRUCTIONS:   Knee Replacement:  Do not place pillow under knee, focus on keeping the knee straight  while resting. CPM instructions: 0-90 degrees, 2 hours in the morning, 2 hours in the afternoon, and 2 hours in the evening. Place foam block, curve side up under heel at all times except when in CPM or when walking.  DO NOT modify, tear, cut, or change the foam block in any way.  MAKE SURE YOU:  Understand these instructions.  Get help right away if you are not doing well or get worse.    Thank you for letting us be a part of your medical care team.  It is a privilege we respect greatly.  We hope these instructions will help you stay on track for a fast and full recovery!   Increase activity slowly as tolerated    Complete by:  As directed       Follow-up Information    DALLDORF,PETER G, MD. Schedule an appointment as soon as possible for a visit in 2 week(s).   Specialty:  Orthopedic Surgery Contact information: [address] [place] [address] [phone]        [facility] Follow up.   Why:  Someone from [facility] will contact you to arrange start date and time for therapy. Contact information: [address] [place] [address] [phone]            Signed: NORTON, BIVINS 01/07/2016, 12:56 PM

## 2016-01-07 NOTE — Progress Notes (Signed)
Discharge instructions reviewed with patient and wife.  These included the following:  Prescriptions, medications, follow-up appointments, when to call the MD, incision care, etc.  Ascertaineded comprehension of instructions via "teach-back" method.  Patient discharged to home via private vehicle accompanied by wife.  Escorted to exit via wheelchair by NT.

## 2016-01-19 ENCOUNTER — Other Ambulatory Visit: Payer: Self-pay | Admitting: Internal Medicine

## 2016-01-23 ENCOUNTER — Other Ambulatory Visit: Payer: Self-pay | Admitting: Internal Medicine

## 2016-02-02 ENCOUNTER — Other Ambulatory Visit: Payer: Self-pay | Admitting: Physician Assistant

## 2016-04-26 ENCOUNTER — Telehealth: Payer: Self-pay | Admitting: Physician Assistant

## 2016-04-26 MED ORDER — PREDNISONE 20 MG PO TABS: ORAL_TABLET | ORAL | 0 refills | Status: DC

## 2016-04-26 NOTE — Telephone Encounter (Signed)
Had had cold symptoms x 4 days, has appt Thursday. Has been on advil sinus, has sore throat, HA, sinus drainage, pressure, ear pain.   Needs to get on allergy pill like allegra and will send in prednisone.   Make sure you are on an allergy pill, see below for more details. Please take the prednisone as directed below, this is NOT an antibiotic so you do NOT have to finish it. You can take it for a few days and stop it if you are doing better.   Please take the prednisone to help decrease inflammation and therefore decrease symptoms. Take it it with food to avoid GI upset. It can cause increased energy but on the other hand it can make it hard to sleep at night so please take it AT NIGHT WITH DINNER, it takes 8-12 hours to start working so it will NOT affect your sleeping if you take it at night with your food!!  If you are diabetic it will increase your sugars so decrease carbs and monitor your sugars closely.      Will see Thursday.

## 2016-04-26 NOTE — Telephone Encounter (Signed)
Pt was made aware of Rx that was sent.  Pt agreed to instructions & voiced understanding of these instructions.  Get on allergy med & take prednisone.

## 2016-04-28 ENCOUNTER — Ambulatory Visit (INDEPENDENT_AMBULATORY_CARE_PROVIDER_SITE_OTHER): Payer: BLUE CROSS/BLUE SHIELD | Admitting: Physician Assistant

## 2016-04-28 ENCOUNTER — Other Ambulatory Visit: Payer: Self-pay | Admitting: Internal Medicine

## 2016-04-28 ENCOUNTER — Encounter: Payer: Self-pay | Admitting: Physician Assistant

## 2016-04-28 VITALS — BP 136/74 | HR 75 | Temp 97.5°F | Resp 16 | Ht 65.0 in | Wt 265.0 lb

## 2016-04-28 DIAGNOSIS — J0100 Acute maxillary sinusitis, unspecified: Secondary | ICD-10-CM | POA: Diagnosis not present

## 2016-04-28 DIAGNOSIS — E559 Vitamin D deficiency, unspecified: Secondary | ICD-10-CM | POA: Diagnosis not present

## 2016-04-28 DIAGNOSIS — E785 Hyperlipidemia, unspecified: Secondary | ICD-10-CM

## 2016-04-28 DIAGNOSIS — R7309 Other abnormal glucose: Secondary | ICD-10-CM

## 2016-04-28 DIAGNOSIS — Z79899 Other long term (current) drug therapy: Secondary | ICD-10-CM | POA: Diagnosis not present

## 2016-04-28 DIAGNOSIS — I10 Essential (primary) hypertension: Secondary | ICD-10-CM

## 2016-04-28 LAB — CBC WITH DIFFERENTIAL/PLATELET
Basophils Absolute: 0 cells/uL (ref 0–200)
Basophils Relative: 0 %
Eosinophils Absolute: 0 cells/uL — ABNORMAL LOW (ref 15–500)
Eosinophils Relative: 0 %
HCT: 43 % (ref 38.5–50.0)
Hemoglobin: 14.4 g/dL (ref 13.2–17.1)
Lymphocytes Relative: 19 %
Lymphs Abs: 1976 cells/uL (ref 850–3900)
MCH: 27.3 pg (ref 27.0–33.0)
MCHC: 33.5 g/dL (ref 32.0–36.0)
MCV: 81.4 fL (ref 80.0–100.0)
MPV: 10 fL (ref 7.5–12.5)
Monocytes Absolute: 832 cells/uL (ref 200–950)
Monocytes Relative: 8 %
Neutro Abs: 7592 cells/uL (ref 1500–7800)
Neutrophils Relative %: 73 %
Platelets: 335 10*3/uL (ref 140–400)
RBC: 5.28 MIL/uL (ref 4.20–5.80)
RDW: 15 % (ref 11.0–15.0)
WBC: 10.4 10*3/uL (ref 3.8–10.8)

## 2016-04-28 LAB — BASIC METABOLIC PANEL WITH GFR
BUN: 15 mg/dL (ref 7–25)
CO2: 23 mmol/L (ref 20–31)
Calcium: 9.6 mg/dL (ref 8.6–10.3)
Chloride: 106 mmol/L (ref 98–110)
Creat: 0.91 mg/dL (ref 0.70–1.33)
GFR, Est African American: >89 mL/min (ref >=60)
GFR, Est Non African American: >89 mL/min (ref >=60)
Glucose, Bld: 95 mg/dL (ref 65–99)
Potassium: 3.9 mmol/L (ref 3.5–5.3)
Sodium: 140 mmol/L (ref 135–146)

## 2016-04-28 LAB — LIPID PANEL
Cholesterol: 273 mg/dL — ABNORMAL HIGH (ref <200)
HDL: 56 mg/dL (ref >40)
LDL Cholesterol: 191 mg/dL — ABNORMAL HIGH (ref <100)
Total CHOL/HDL Ratio: 4.9 Ratio (ref <5.0)
Triglycerides: 129 mg/dL (ref <150)
VLDL: 26 mg/dL (ref <30)

## 2016-04-28 LAB — HEPATIC FUNCTION PANEL
ALT: 16 U/L (ref 9–46)
AST: 13 U/L (ref 10–35)
Albumin: 4.3 g/dL (ref 3.6–5.1)
Alkaline Phosphatase: 127 U/L — ABNORMAL HIGH (ref 40–115)
Bilirubin, Direct: 0.1 mg/dL (ref <=0.2)
Indirect Bilirubin: 0.2 mg/dL (ref 0.2–1.2)
Total Bilirubin: 0.3 mg/dL (ref 0.2–1.2)
Total Protein: 7.3 g/dL (ref 6.1–8.1)

## 2016-04-28 LAB — TSH: TSH: 1.01 mIU/L (ref 0.40–4.50)

## 2016-04-28 LAB — HEMOGLOBIN A1C
Hgb A1c MFr Bld: 6.6 % — ABNORMAL HIGH (ref <5.7)
Mean Plasma Glucose: 143 mg/dL

## 2016-04-28 LAB — MAGNESIUM: Magnesium: 1.9 mg/dL (ref 1.5–2.5)

## 2016-04-28 MED ORDER — FLUTICASONE PROPIONATE 50 MCG/ACT NA SUSP: 2.0000 | Freq: Every day | NASAL | 1 refills | Status: DC

## 2016-04-28 MED ORDER — AZITHROMYCIN 250 MG PO TABS: ORAL_TABLET | ORAL | 1 refills | Status: AC

## 2016-04-28 NOTE — Progress Notes (Signed)
Assessment and Plan:   Hypertension -Continue medication, monitor blood pressure at home. Continue DASH diet.  Reminder to go to the ER if any CP, SOB, nausea, dizziness, severe HA, changes vision/speech, left arm numbness and tingling and jaw pain.  Cholesterol -Continue diet and exercise. Check cholesterol.    Prediabetes  -Continue diet and exercise. Check A1C  Vitamin D Def - check level and continue medications.   Morbid Obesity with co morbidities - long discussion about weight loss, diet, and exercise  Sinusitis Get on flonase, continue prednisone, will take zpak if not better  Continue diet and meds as discussed. Further disposition pending results of labs. Over 30 minutes of exam, counseling, chart review, and critical decision making was performed  No future appointments.   HPI 61 y.o. male  presents for 3 month follow up on hypertension, cholesterol, prediabetes, and vitamin D deficiency.   His blood pressure has been controlled at home, today their BP is BP: 136/74  He does not workout. He denies chest pain, shortness of breath.  Saturday night had some sinus congestion, ear fullness, and vertigo, started on prednisone 03/06, he is also on claritin, singulair, and claritin.  He had right hip and left hip replacement with Dr. Jerl Santosalldorf, he is now released to start walking.   He is not on cholesterol medication and denies myalgias. His cholesterol is not at goal. The cholesterol last visit was:   Lab Results  Component Value Date   CHOL 267 (H) 05/08/2015   HDL 46 05/08/2015   LDLCALC 180 (H) 05/08/2015   TRIG 206 (H) 05/08/2015   CHOLHDL 5.8 (H) 05/08/2015    He has been working on diet and exercise for prediabetes, and denies paresthesia of the feet, polydipsia, polyuria and visual disturbances. Last A1C in the office was:  Lab Results  Component Value Date   HGBA1C 6.2 (H) 05/08/2015   Patient is on Vitamin D supplement.   Lab Results  Component Value Date    VD25OH 22 (L) 05/08/2015     BMI is Body mass index is 44.1 kg/m., he is working on diet and exercise. Wt Readings from Last 3 Encounters:  04/28/16 265 lb (120.2 kg)  01/05/16 255 lb (115.7 kg)  12/28/15 255 lb (115.7 kg)     Current Medications:  Current Outpatient Prescriptions on File Prior to Visit  Medication Sig  . amLODipine (NORVASC) 10 MG tablet TAKE ONE TABLET BY MOUTH DAILY  . aspirin EC 325 MG EC tablet Take 1 tablet (325 mg total) by mouth 2 (two) times daily after a meal.  . Cholecalciferol (VITAMIN D PO) Take 1 tablet by mouth daily.  Marland Kitchen. L-LYSINE PO Take 1 tablet by mouth daily.  Marland Kitchen. lisinopril (PRINIVIL,ZESTRIL) 20 MG tablet TAKE ONE TABLET BY MOUTH DAILY  . loratadine (CLARITIN) 10 MG tablet TAKE 1 TABLET DAILY  . montelukast (SINGULAIR) 10 MG tablet TAKE ONE TABLET BY MOUTH DAILY  . omeprazole (PRILOSEC) 40 MG capsule TAKE ONE CAPSULE BY MOUTH TWICE A DAY  . Oxycodone HCl 10 MG TABS Take 1-2 tablets (10-20 mg total) by mouth every 4 (four) hours as needed. pain  . predniSONE (DELTASONE) 20 MG tablet 2 tablets daily for 3 days, 1 tablet daily for 4 days.   No current facility-administered medications on file prior to visit.     Medical History:  Past Medical History:  Diagnosis Date  . DDD lumbar   . DJD (degenerative joint disease)   . GERD (gastroesophageal reflux disease)  takes Omeprazole daily  . Hyperlipidemia   . Hypertension    takes Lisinopril and Amlodipine daily  . Multiple allergies    takes Claritin and Singulair daily  . PONV (postoperative nausea and vomiting)    Allergies:  Allergies  Allergen Reactions  . Statins Other (See Comments)    Flu-like symptoms  . Tylenol [Acetaminophen]     Liver enzymes elevated so patient has stopped taking anything with acetaminophen in it  . Phentermine Other (See Comments)    constipation     Review of Systems:  Review of Systems  Constitutional: Negative.   HENT: Negative.   Eyes: Negative.    Respiratory: Negative.   Cardiovascular: Negative.   Gastrointestinal: Negative.   Genitourinary: Negative.   Musculoskeletal: Positive for back pain, joint pain and myalgias.  Skin: Negative.   Neurological: Negative.   Endo/Heme/Allergies: Negative.   Psychiatric/Behavioral: Negative.     Family history- Review and unchanged Social history- Review and unchanged Physical Exam: BP 136/74   Pulse 75   Temp 97.5 F (36.4 C)   Resp 16   Ht 5\' 5"  (1.651 m)   Wt 265 lb (120.2 kg)   SpO2 98%   BMI 44.10 kg/m  Wt Readings from Last 3 Encounters:  04/28/16 265 lb (120.2 kg)  01/05/16 255 lb (115.7 kg)  12/28/15 255 lb (115.7 kg)   General Appearance: Well nourished, in no apparent distress. Eyes: PERRLA, EOMs, conjunctiva no swelling or erythema Sinuses: No Frontal/maxillary tenderness ENT/Mouth: Ext aud canals clear, TMs without erythema, bulging. No erythema, swelling, or exudate on post pharynx.  Tonsils not swollen or erythematous. Hearing normal.  Neck: Supple, thyroid normal.  Respiratory: Respiratory effort normal, BS equal bilaterally without rales, rhonchi, wheezing or stridor.  Cardio: RRR with no MRGs. Brisk peripheral pulses without edema.  Abdomen: Soft, + BS,  Non tender, no guarding, rebound, hernias, masses. Lymphatics: Non tender without lymphadenopathy.  Musculoskeletal: Full ROM, 5/5 strength, Normal gait Skin: Warm, dry without rashes, lesions, ecchymosis.  Neuro: Cranial nerves intact. Normal muscle tone, no cerebellar symptoms. Psych: Awake and oriented X 3, normal affect, Insight and Judgment appropriate.    Quentin Mulling, PA-C 11:11 AM [facility]

## 2016-04-28 NOTE — Patient Instructions (Signed)
HOW TO TREAT VIRAL COUGH AND COLD SYMPTOMS:  -Symptoms usually last at least 1 week with the worst symptoms being around day 4.  - colds usually start with a sore throat and end with a cough, and the cough can take 2 weeks to get better.  -No antibiotics are needed for colds, flu, sore throats, cough, bronchitis UNLESS symptoms are longer than 7 days OR if you are getting better then get drastically worse.  -There are a lot of combination medications (Dayquil, Nyquil, Vicks 44, tyelnol cold and sinus, ETC). Please look at the ingredients on the back so that you are treating the correct symptoms and not doubling up on medications/ingredients.    Medicines you can use  Nasal congestion  - pseudoephedrine (Sudafed)- behind the counter, do not use if you have high blood pressure, medicine that have -D in them.  - phenylephrine (Sudafed PE) -Dextormethorphan + chlorpheniramine (Coridcidin HBP)- okay if you have high blood pressure -Oxymetazoline (Afrin) nasal spray- LIMIT to 3 days -Saline nasal spray -Neti pot (used distilled or bottled water)  Ear pain/congestion  -pseudoephedrine (sudafed) - Nasonex/flonase nasal spray  Fever  -Acetaminophen (Tyelnol) -Ibuprofen (Advil, motrin, aleve)  Sore Throat  -Acetaminophen (Tyelnol) -Ibuprofen (Advil, motrin, aleve) -Drink a lot of water -Gargle with salt water - Rest your voice (don't talk) -Throat sprays -Cough drops  Body Aches  -Acetaminophen (Tyelnol) -Ibuprofen (Advil, motrin, aleve)  Headache  -Acetaminophen (Tyelnol) -Ibuprofen (Advil, motrin, aleve) - Exedrin, Exedrin Migraine  Allergy symptoms (cough, sneeze, runny nose, itchy eyes) -Claritin or loratadine cheapest but likely the weakest  -Zyrtec or certizine at night because it can make you sleepy -The strongest is allegra or fexafinadine  Cheapest at [employer], [employer], [employer]  Cough  -Dextromethorphan (Delsym)- medicine that has DM in it -Guafenesin  (Mucinex/Robitussin) - cough drops - drink lots of water  Chest Congestion  -Guafenesin (Mucinex/Robitussin)  Red Itchy Eyes  - Naphcon-A  Upset Stomach  - Bland diet (nothing spicy, greasy, fried, and high acid foods like tomatoes, oranges, berries) -OKAY- cereal, bread, soup, crackers, rice -Eat smaller more frequent meals -reduce caffeine, no alcohol -Loperamide (Imodium-AD) if diarrhea -Prevacid for heart burn  General health when sick  -Hydration -wash your hands frequently -keep surfaces clean -change pillow cases and sheets often -Get fresh air but do not exercise strenuously -Vitamin D, double up on it - Vitamin C -Zinc     Simple math prevails.    1st - exercise does not produce significant weight loss - at best one converts fat into muscle , "bulks up", loses inches, but usually stays "weight neutral"     2nd - think of your body weightas a check book: If you eat more calories than you burn up - you save money or gain weight .... Or if you spend more money than you put in the check book, ie burn up more calories than you eat, then you lose weight     3rd - if you walk or run 1 mile, you burn up 100 calories - you have to burn up 3,500 calories to lose 1 pound, ie you have to walk/run 35 miles to lose 1 measly pound. So if you want to lose 10 #, then you have to walk/run 350 miles, so.... clearly exercise is not the solution.     4. So if you consume 1,500 calories, then you have to burn up the equivalent of 15 miles to stay weight neutral - It also stands to reason that  if you consume 1,500 cal/day and don't lose weight, then you must be burning up about 1,500 cals/day to stay weight neutral.     5. If you really want to lose weight, you must cut your calorie intake 300 calories /day and at that rate you should lose about 1 # every 3 days.   6. Please purchase Dr Francis Dowse Fuhrman's book(s) "The End of Dieting" & "Eat to Live" . It has some great concepts and recipes.       We want weight loss that will last so you should lose 1-2 pounds a week.  THAT IS IT! Please pick THREE things a month to change. Once it is a habit check off the item. Then pick another three items off the list to become habits.  If you are already doing a habit on the list GREAT!  Cross that item off! o Don't drink your calories. Ie, alcohol, soda, fruit juice, and sweet tea.  o Drink more water. Drink a glass when you feel hungry or before each meal.  o Eat breakfast - Complex carb and protein (likeDannon light and fit yogurt, oatmeal, fruit, eggs, [place] bacon). o Measure your cereal.  Eat no more than one cup a day. (ie [place]) o Eat an apple a day. o Add a vegetable a day. o Try a new vegetable a month. o Use Pam! Stop using oil or butter to [place]. o Don't finish your plate or use smaller plates. o Share your dessert. o Eat sugar free Jello for dessert or frozen grapes. o Don't eat 2-3 hours before bed. o Switch to whole wheat bread, pasta, and brown rice. o Make healthier choices when you eat out. No fries! o Pick baked chicken, NOT fried. o Don't forget to SLOW DOWN when you eat. It is not going anywhere.  o Take the stairs. o Park far away in the parking lot o [employer] (or weights) for 10 minutes while watching TV. o Walk at work for 10 minutes during break. o Walk outside 1 time a week with your friend, kids, dog, or significant other. o Start a walking group at church. o Walk the mall as much as you can tolerate.  o Keep a food diary. o Weigh yourself daily. o Walk for 15 minutes 3 days per week. o Paiva at home more often and eat out less.  If life happens and you go back to old habits, it is okay.  Just start over. You can do it!   If you experience chest pain, get short of breath, or tired during the exercise, please stop immediately and inform your doctor.

## 2016-04-29 LAB — VITAMIN D 25 HYDROXY (VIT D DEFICIENCY, FRACTURES): Vit D, 25-Hydroxy: 37 ng/mL (ref 30–100)

## 2016-05-06 ENCOUNTER — Encounter: Payer: Self-pay | Admitting: Physician Assistant

## 2016-05-06 ENCOUNTER — Other Ambulatory Visit: Payer: Self-pay | Admitting: Internal Medicine

## 2016-05-06 MED ORDER — PREDNISONE 20 MG PO TABS: ORAL_TABLET | ORAL | 0 refills | Status: DC

## 2016-05-06 MED ORDER — LEVOFLOXACIN 500 MG PO TABS: ORAL_TABLET | ORAL | 0 refills | Status: AC

## 2016-06-01 ENCOUNTER — Other Ambulatory Visit: Payer: Self-pay | Admitting: Physician Assistant

## 2016-06-29 ENCOUNTER — Other Ambulatory Visit: Payer: Self-pay | Admitting: Physician Assistant

## 2016-07-21 ENCOUNTER — Other Ambulatory Visit: Payer: Self-pay | Admitting: Internal Medicine

## 2016-07-24 ENCOUNTER — Other Ambulatory Visit: Payer: Self-pay | Admitting: Physician Assistant

## 2016-08-08 NOTE — Progress Notes (Deleted)
Assessment and Plan:   Hypertension -Continue medication, monitor blood pressure at home. Continue DASH diet.  Reminder to go to the ER if any CP, SOB, nausea, dizziness, severe HA, changes vision/speech, left arm numbness and tingling and jaw pain.  Cholesterol -Continue diet and exercise. Check cholesterol.    Prediabetes  -Continue diet and exercise. Check A1C  Vitamin D Def - check level and continue medications.   Morbid Obesity with co morbidities - long discussion about weight loss, diet, and exercise   Continue diet and meds as discussed. Further disposition pending results of labs. Over 30 minutes of exam, counseling, chart review, and critical decision making was performed  Future Appointments Date Time Provider Department Center  08/09/2016 11:00 AM Quentin Mullingollier, Ivannah Zody, PA-C [facility] None     HPI 61 y.o. male  presents for 3 month follow up on hypertension, cholesterol, prediabetes, and vitamin D deficiency.   His blood pressure has been controlled at home, today their BP is    He does not workout. He denies chest pain, shortness of breath.  He had right hip and left hip replacement with Dr. Jerl Santosalldorf, he is now released to start walking.   He is not on cholesterol medication and denies myalgias. His cholesterol is not at goal. The cholesterol last visit was:   Lab Results  Component Value Date   CHOL 273 (H) 04/28/2016   HDL 56 04/28/2016   LDLCALC 191 (H) 04/28/2016   TRIG 129 04/28/2016   CHOLHDL 4.9 04/28/2016    He has been working on diet and exercise for prediabetes, last visit he was in the DM range, will check again today, and denies paresthesia of the feet, polydipsia, polyuria and visual disturbances.  Last A1C in the office was:  Lab Results  Component Value Date   HGBA1C 6.6 (H) 04/28/2016   Patient is on Vitamin D supplement.   Lab Results  Component Value Date   VD25OH 37 04/28/2016     BMI is There is no height or weight on file to calculate  BMI., he is working on diet and exercise. Wt Readings from Last 3 Encounters:  04/28/16 265 lb (120.2 kg)  01/05/16 255 lb (115.7 kg)  12/28/15 255 lb (115.7 kg)     Current Medications:  Current Outpatient Prescriptions on File Prior to Visit  Medication Sig  . amLODipine (NORVASC) 10 MG tablet TAKE ONE TABLET BY MOUTH DAILY  . aspirin EC 325 MG EC tablet Take 1 tablet (325 mg total) by mouth 2 (two) times daily after a meal.  . Cholecalciferol (VITAMIN D PO) Take 1 tablet by mouth daily.  . cyanocobalamin 500 MCG tablet Take 500 mcg by mouth daily.  . fluticasone (FLONASE) 50 MCG/ACT nasal spray Place 2 sprays into both nostrils at bedtime.  Marland Kitchen. L-LYSINE PO Take 1 tablet by mouth daily.  Marland Kitchen. lisinopril (PRINIVIL,ZESTRIL) 20 MG tablet TAKE ONE TABLET BY MOUTH DAILY  . loratadine (CLARITIN) 10 MG tablet TAKE ONE TABLET BY MOUTH DAILY  . metaxalone (SKELAXIN) 800 MG tablet Take 800 mg by mouth 3 (three) times daily.  . montelukast (SINGULAIR) 10 MG tablet TAKE ONE TABLET BY MOUTH DAILY  . omeprazole (PRILOSEC) 40 MG capsule TAKE ONE CAPSULE BY MOUTH TWICE A DAY  . Oxycodone HCl 10 MG TABS Take 1-2 tablets (10-20 mg total) by mouth every 4 (four) hours as needed. pain  . predniSONE (DELTASONE) 20 MG tablet 1 tab 3 x day for 3 days, then 1 tab 2 x  day for 3 days, then 1 tab 1 x day for 5 days  . Turmeric 500 MG TABS Take by mouth.   No current facility-administered medications on file prior to visit.     Medical History:  Past Medical History:  Diagnosis Date  . DDD lumbar   . DJD (degenerative joint disease)   . GERD (gastroesophageal reflux disease)    takes Omeprazole daily  . Hyperlipidemia   . Hypertension    takes Lisinopril and Amlodipine daily  . Multiple allergies    takes Claritin and Singulair daily  . PONV (postoperative nausea and vomiting)    Allergies:  Allergies  Allergen Reactions  . Statins Other (See Comments)    Flu-like symptoms  . Tylenol [Acetaminophen]      Liver enzymes elevated so patient has stopped taking anything with acetaminophen in it  . Phentermine Other (See Comments)    constipation     Review of Systems:  Review of Systems  Constitutional: Negative.   HENT: Negative.   Eyes: Negative.   Respiratory: Negative.   Cardiovascular: Negative.   Gastrointestinal: Negative.   Genitourinary: Negative.   Musculoskeletal: Positive for back pain, joint pain and myalgias.  Skin: Negative.   Neurological: Negative.   Endo/Heme/Allergies: Negative.   Psychiatric/Behavioral: Negative.     Family history- Review and unchanged Social history- Review and unchanged Physical Exam: There were no vitals taken for this visit. Wt Readings from Last 3 Encounters:  04/28/16 265 lb (120.2 kg)  01/05/16 255 lb (115.7 kg)  12/28/15 255 lb (115.7 kg)   General Appearance: Well nourished, in no apparent distress. Eyes: PERRLA, EOMs, conjunctiva no swelling or erythema Sinuses: No Frontal/maxillary tenderness ENT/Mouth: Ext aud canals clear, TMs without erythema, bulging. No erythema, swelling, or exudate on post pharynx.  Tonsils not swollen or erythematous. Hearing normal.  Neck: Supple, thyroid normal.  Respiratory: Respiratory effort normal, BS equal bilaterally without rales, rhonchi, wheezing or stridor.  Cardio: RRR with no MRGs. Brisk peripheral pulses without edema.  Abdomen: Soft, + BS,  Non tender, no guarding, rebound, hernias, masses. Lymphatics: Non tender without lymphadenopathy.  Musculoskeletal: Full ROM, 5/5 strength, Normal gait Skin: Warm, dry without rashes, lesions, ecchymosis.  Neuro: Cranial nerves intact. Normal muscle tone, no cerebellar symptoms. Psych: Awake and oriented X 3, normal affect, Insight and Judgment appropriate.    Quentin Mulling, PA-C 7:01 AM [facility]

## 2016-08-09 ENCOUNTER — Ambulatory Visit: Payer: Self-pay | Admitting: Physician Assistant

## 2016-08-28 ENCOUNTER — Encounter: Payer: Self-pay | Admitting: Physician Assistant

## 2016-08-29 ENCOUNTER — Other Ambulatory Visit: Payer: Self-pay | Admitting: Physician Assistant

## 2016-08-29 MED ORDER — PREDNISONE 20 MG PO TABS: ORAL_TABLET | ORAL | 0 refills | Status: AC

## 2016-08-29 MED ORDER — VALACYCLOVIR HCL 1 G PO TABS: 1000.0000 mg | ORAL_TABLET | Freq: Three times a day (TID) | ORAL | 0 refills | Status: DC

## 2016-09-20 ENCOUNTER — Other Ambulatory Visit: Payer: Self-pay | Admitting: Physician Assistant

## 2016-10-14 NOTE — Progress Notes (Signed)
Assessment and Plan:   Hypertension -Continue medication, monitor blood pressure at home. Continue DASH diet.  Reminder to go to the ER if any CP, SOB, nausea, dizziness, severe HA, changes vision/speech, left arm numbness and tingling and jaw pain.  Cholesterol -Continue diet and exercise. Check cholesterol.    Prediabetes/recently in DM range -Continue diet and exercise. Check A1C - Long discussion   Vitamin D Def - check level and continue medications.   Morbid Obesity with co morbidities - long discussion about weight loss, diet, and exercise  Continue diet and meds as discussed. Further disposition pending results of labs. Over 30 minutes of exam, counseling, chart review, and critical decision making was performed  Future Appointments Date Time Provider Department Center  10/17/2016 11:15 AM Quentin Mulling, PA-C [facility] None     HPI 61 y.o. male  presents for 3 month follow up on hypertension, cholesterol, prediabetes, and vitamin D deficiency.   His blood pressure has been controlled at home, today their BP is BP: 126/74  He does not workout. He denies chest pain, shortness of breath.  He had right hip and left hip replacement with Dr. Jerl Santos end of last year.   He is not on cholesterol medication and denies myalgias. His cholesterol is not at goal. The cholesterol last visit was:   Lab Results  Component Value Date   CHOL 273 (H) 04/28/2016   HDL 56 04/28/2016   LDLCALC 191 (H) 04/28/2016   TRIG 129 04/28/2016   CHOLHDL 4.9 04/28/2016    He has been working on diet and exercise for diabetes since last visit, and denies paresthesia of the feet, polydipsia, polyuria and visual disturbances. Last A1C in the office was:  Lab Results  Component Value Date   HGBA1C 6.6 (H) 04/28/2016   Patient is on Vitamin D supplement.   Lab Results  Component Value Date   VD25OH 37 04/28/2016     BMI is Body mass index is 43.57 kg/m., he is working on diet and  exercise. Wt Readings from Last 3 Encounters:  10/17/16 261 lb 12.8 oz (118.8 kg)  04/28/16 265 lb (120.2 kg)  01/05/16 255 lb (115.7 kg)     Current Medications:  Current Outpatient Prescriptions on File Prior to Visit  Medication Sig  . amLODipine (NORVASC) 10 MG tablet TAKE ONE TABLET BY MOUTH DAILY  . Cholecalciferol (VITAMIN D PO) Take 1 tablet by mouth daily.  . cyanocobalamin 500 MCG tablet Take 500 mcg by mouth daily.  . fluticasone (FLONASE) 50 MCG/ACT nasal spray Place 2 sprays into both nostrils at bedtime.  Marland Kitchen L-LYSINE PO Take 1 tablet by mouth daily.  Marland Kitchen lisinopril (PRINIVIL,ZESTRIL) 20 MG tablet TAKE ONE TABLET BY MOUTH DAILY  . loratadine (CLARITIN) 10 MG tablet TAKE ONE TABLET BY MOUTH DAILY  . metaxalone (SKELAXIN) 800 MG tablet Take 800 mg by mouth 3 (three) times daily.  . montelukast (SINGULAIR) 10 MG tablet TAKE ONE TABLET BY MOUTH DAILY  . omeprazole (PRILOSEC) 40 MG capsule TAKE ONE CAPSULE BY MOUTH TWICE A DAY  . Oxycodone HCl 10 MG TABS Take 1-2 tablets (10-20 mg total) by mouth every 4 (four) hours as needed. pain   No current facility-administered medications on file prior to visit.     Medical History:  Past Medical History:  Diagnosis Date  . DDD lumbar   . DJD (degenerative joint disease)   . GERD (gastroesophageal reflux disease)    takes Omeprazole daily  . Hyperlipidemia   .  Hypertension    takes Lisinopril and Amlodipine daily  . Multiple allergies    takes Claritin and Singulair daily  . PONV (postoperative nausea and vomiting)    Allergies:  Allergies  Allergen Reactions  . Statins Other (See Comments)    Flu-like symptoms  . Tylenol [Acetaminophen]     Liver enzymes elevated so patient has stopped taking anything with acetaminophen in it  . Phentermine Other (See Comments)    constipation     Review of Systems:  Review of Systems  Constitutional: Negative.   HENT: Negative.   Eyes: Negative.   Respiratory: Negative.    Cardiovascular: Negative.   Gastrointestinal: Negative.   Genitourinary: Negative.   Musculoskeletal: Positive for back pain, joint pain and myalgias.  Skin: Negative.   Neurological: Negative.   Endo/Heme/Allergies: Negative.   Psychiatric/Behavioral: Negative.     Family history- Review and unchanged Social history- Review and unchanged Physical Exam: BP 126/74   Pulse 64   Temp (!) 97.3 F (36.3 C)   Resp 14   Ht 5\' 5"  (1.651 m)   Wt 261 lb 12.8 oz (118.8 kg)   SpO2 98%   BMI 43.57 kg/m  Wt Readings from Last 3 Encounters:  10/17/16 261 lb 12.8 oz (118.8 kg)  04/28/16 265 lb (120.2 kg)  01/05/16 255 lb (115.7 kg)   General Appearance: Well nourished, in no apparent distress. Eyes: PERRLA, EOMs, conjunctiva no swelling or erythema Sinuses: No Frontal/maxillary tenderness ENT/Mouth: Ext aud canals clear, TMs without erythema, bulging. No erythema, swelling, or exudate on post pharynx.  Tonsils not swollen or erythematous. Hearing normal.  Neck: Supple, thyroid normal.  Respiratory: Respiratory effort normal, BS equal bilaterally without rales, rhonchi, wheezing or stridor.  Cardio: RRR with no MRGs. Brisk peripheral pulses without edema.  Abdomen: Soft, + BS,  Non tender, no guarding, rebound, hernias, masses. Lymphatics: Non tender without lymphadenopathy.  Musculoskeletal: Full ROM, 5/5 strength, Normal gait Skin: Warm, dry without rashes, lesions, ecchymosis.  Neuro: Cranial nerves intact. Normal muscle tone, no cerebellar symptoms. Psych: Awake and oriented X 3, normal affect, Insight and Judgment appropriate.    Quentin Mulling, PA-C 11:01 AM [facility]

## 2016-10-17 ENCOUNTER — Other Ambulatory Visit: Payer: Self-pay | Admitting: Physician Assistant

## 2016-10-17 ENCOUNTER — Ambulatory Visit (INDEPENDENT_AMBULATORY_CARE_PROVIDER_SITE_OTHER): Payer: BLUE CROSS/BLUE SHIELD | Admitting: Physician Assistant

## 2016-10-17 ENCOUNTER — Encounter: Payer: Self-pay | Admitting: Physician Assistant

## 2016-10-17 VITALS — BP 126/74 | HR 64 | Temp 97.3°F | Resp 14 | Ht 65.0 in | Wt 261.8 lb

## 2016-10-17 DIAGNOSIS — E785 Hyperlipidemia, unspecified: Secondary | ICD-10-CM

## 2016-10-17 DIAGNOSIS — I10 Essential (primary) hypertension: Secondary | ICD-10-CM | POA: Diagnosis not present

## 2016-10-17 DIAGNOSIS — Z79899 Other long term (current) drug therapy: Secondary | ICD-10-CM

## 2016-10-17 DIAGNOSIS — R7309 Other abnormal glucose: Secondary | ICD-10-CM | POA: Diagnosis not present

## 2016-10-17 DIAGNOSIS — E559 Vitamin D deficiency, unspecified: Secondary | ICD-10-CM | POA: Diagnosis not present

## 2016-10-17 NOTE — Patient Instructions (Addendum)
Add ENTERIC COATED low dose 81 mg Aspirin daily OR can do every other day if you have easy bruising to protect your heart and head. As well as to reduce risk of Colon Cancer by 20 %, Skin Cancer by 26 % , Melanoma by 46% and Pancreatic cancer by 60%  Diabetes is a very complicated disease...lets simplify it.  An easy way to look at it to understand the complications is if you think of the extra sugar floating in your blood stream as glass shards floating through your blood stream.    Diabetes affects your small vessels first: 1) The glass shards (sugar) scraps down the tiny blood vessels in your eyes and lead to diabetic retinopathy, the leading cause of blindness in the [place]. Diabetes is the leading cause of newly diagnosed adult (77 to 59 years of age) blindness in the [place].  2) The glass shards scratches down the tiny vessels of your legs leading to nerve damage called neuropathy and can lead to amputations of your feet. More than 60% of all non-traumatic amputations of lower limbs occur in people with diabetes.  3) Over time the small vessels in your brain are shredded and closed off, individually this does not cause any problems but over a long period of time many of the small vessels being blocked can lead to Vascular Dementia.   4) Your kidney's are a filter system and have a "net" that keeps certain things in the body and lets bad things out. Sugar shreds this net and leads to kidney damage and eventually failure. Decreasing the sugar that is destroying the net and certain blood pressure medications can help stop or decrease progression of kidney disease. Diabetes was the primary cause of kidney failure in 44 percent of all new cases in 2011.  5) Diabetes also destroys the small vessels in your penis that lead to erectile dysfunction. Eventually the vessels are so damaged that you may not be responsive to cialis or viagra.   Diabetes and your large vessels: Your larger vessels  consist of your coronary arteries in your heart and the carotid vessels to your brain. Diabetes or even increased sugars put you at 300% increased risk of heart attack and stroke and this is why.. The sugar scrapes down your large blood vessels and your body sees this as an internal injury and tries to repair itself. Just like you get a scab on your skin, your platelets will stick to the blood vessel wall trying to heal it. This is why we have diabetics on low dose aspirin daily, this prevents the platelets from sticking and can prevent plaque formation. In addition, your body takes cholesterol and tries to shove it into the open wound. This is why we want your LDL, or bad cholesterol, below 70.   The combination of platelets and cholesterol over 5-10 years forms plaque that can break off and cause a heart attack or stroke.   PLEASE REMEMBER:  Diabetes is preventable! Up to 85 percent of complications and morbidities among individuals with type 2 diabetes can be prevented, delayed, or effectively treated and minimized with regular visits to a health professional, appropriate monitoring and medication, and a healthy diet and lifestyle.  Drink 80-100 oz a day of water, measure it out Eat 3 meals a day, have to do breakfast, eat protein- hard boiled eggs, protein bar like nature valley protein bar, greek yogurt like oikos triple zero, chobani 100, or light n fit greek  Bad carbs also include fruit juice, alcohol, and sweet tea. These are empty calories that do not signal to your brain that you are full.   Please remember the good carbs are still carbs which convert into sugar. So please measure them out no more than 1/2-1 cup of rice, oatmeal, pasta, and beans  Veggies are however free foods! Pile them on.   Not all fruit is created equal. Please see the list below, the fruit at the bottom is higher in sugars than the fruit at the top. Please avoid all dried fruits.    Use a dropper or use a  cap to put olive oil,mineral oil or canola oil in the effected ear- 2-3 times a week. Let it soak for 20-30 min then you can take a shower or use a baby bulb with warm water to wash out the ear wax.  Do not use Qtips

## 2016-10-19 ENCOUNTER — Other Ambulatory Visit: Payer: Self-pay | Admitting: Internal Medicine

## 2016-10-20 LAB — HEMOGLOBIN A1C
Hgb A1c MFr Bld: 7.1 % of total Hgb — ABNORMAL HIGH (ref <5.7)
Mean Plasma Glucose: 157 (calc)
eAG (mmol/L): 8.7 (calc)

## 2016-10-20 LAB — CBC WITH DIFFERENTIAL/PLATELET
Basophils Absolute: 40 cells/uL (ref 0–200)
Basophils Relative: 0.6 %
Eosinophils Absolute: 92 cells/uL (ref 15–500)
Eosinophils Relative: 1.4 %
HCT: 43.9 % (ref 38.5–50.0)
Hemoglobin: 14.6 g/dL (ref 13.2–17.1)
Lymphs Abs: 1901 cells/uL (ref 850–3900)
MCH: 27.8 pg (ref 27.0–33.0)
MCHC: 33.3 g/dL (ref 32.0–36.0)
MCV: 83.6 fL (ref 80.0–100.0)
MPV: 11.1 fL (ref 7.5–12.5)
Monocytes Relative: 9.6 %
Neutro Abs: 3934 cells/uL (ref 1500–7800)
Neutrophils Relative %: 59.6 %
Platelets: 258 10*3/uL (ref 140–400)
RBC: 5.25 10*6/uL (ref 4.20–5.80)
RDW: 13.5 % (ref 11.0–15.0)
Total Lymphocyte: 28.8 %
WBC mixed population: 634 cells/uL (ref 200–950)
WBC: 6.6 10*3/uL (ref 3.8–10.8)

## 2016-10-20 LAB — HEPATIC FUNCTION PANEL
AG Ratio: 1.8 (calc) (ref 1.0–2.5)
ALT: 20 U/L (ref 9–46)
AST: 16 U/L (ref 10–35)
Albumin: 4.4 g/dL (ref 3.6–5.1)
Alkaline phosphatase (APISO): 108 U/L (ref 40–115)
Bilirubin, Direct: 0.1 mg/dL (ref 0.0–0.2)
Globulin: 2.5 g/dL (calc) (ref 1.9–3.7)
Indirect Bilirubin: 0.4 mg/dL (calc) (ref 0.2–1.2)
Total Bilirubin: 0.5 mg/dL (ref 0.2–1.2)
Total Protein: 6.9 g/dL (ref 6.1–8.1)

## 2016-10-20 LAB — TSH: TSH: 1.05 mIU/L (ref 0.40–4.50)

## 2016-10-20 LAB — MAGNESIUM: Magnesium: 1.7 mg/dL (ref 1.5–2.5)

## 2016-10-20 LAB — LIPID PANEL
Cholesterol: 256 mg/dL — ABNORMAL HIGH (ref <200)
HDL: 43 mg/dL (ref >40)
LDL Cholesterol (Calc): 183 mg/dL (calc) — ABNORMAL HIGH
Non-HDL Cholesterol (Calc): 213 mg/dL (calc) — ABNORMAL HIGH (ref <130)
Total CHOL/HDL Ratio: 6 (calc) — ABNORMAL HIGH (ref <5.0)
Triglycerides: 155 mg/dL — ABNORMAL HIGH (ref <150)

## 2016-10-20 LAB — BASIC METABOLIC PANEL WITH GFR
BUN: 13 mg/dL (ref 7–25)
CO2: 29 mmol/L (ref 20–32)
Calcium: 9.7 mg/dL (ref 8.6–10.3)
Chloride: 106 mmol/L (ref 98–110)
Creat: 0.9 mg/dL (ref 0.70–1.33)
GFR, Est African American: 108 mL/min/{1.73_m2} (ref >=60)
GFR, Est Non African American: 93 mL/min/{1.73_m2} (ref >=60)
Glucose, Bld: 125 mg/dL — ABNORMAL HIGH (ref 65–99)
Potassium: 4.8 mmol/L (ref 3.5–5.3)
Sodium: 141 mmol/L (ref 135–146)

## 2016-10-23 ENCOUNTER — Other Ambulatory Visit: Payer: Self-pay | Admitting: Physician Assistant

## 2016-10-25 ENCOUNTER — Other Ambulatory Visit: Payer: Self-pay | Admitting: Internal Medicine

## 2016-11-12 ENCOUNTER — Other Ambulatory Visit: Payer: Self-pay | Admitting: Internal Medicine

## 2016-11-12 ENCOUNTER — Other Ambulatory Visit: Payer: Self-pay | Admitting: Physician Assistant

## 2016-11-12 MED ORDER — AMLODIPINE BESYLATE 10 MG PO TABS: 10.0000 mg | ORAL_TABLET | Freq: Every day | ORAL | 1 refills | Status: DC

## 2016-11-17 ENCOUNTER — Telehealth: Payer: Self-pay | Admitting: Internal Medicine

## 2016-11-17 NOTE — Telephone Encounter (Signed)
-----   Message from Lucky Cowboy, MD sent at 11/12/2016 10:34 AM EDT ----- John Andrade needs his 3 month OV on or after Fri Nov 30th - his Chol and A1c were very  high

## 2016-11-17 NOTE — Telephone Encounter (Signed)
Left message for patient to contact our office and schedule a 3 month follow up to re-check cholesterol.

## 2017-03-08 ENCOUNTER — Other Ambulatory Visit: Payer: Self-pay | Admitting: Internal Medicine

## 2017-04-04 ENCOUNTER — Other Ambulatory Visit: Payer: Self-pay | Admitting: Internal Medicine

## 2017-04-06 NOTE — Progress Notes (Signed)
Complete Physical  Assessment and Plan: Essential hypertension - continue medications, DASH diet, exercise and monitor at home. Call if greater than 130/80.  - CBC with Differential/Platelet - BASIC METABOLIC PANEL WITH GFR - Hepatic function panel - TSH - Urinalysis, Routine w reflex microscopic ([facility]) - Microalbumin / creatinine urine ratio   Morbid obesity, unspecified obesity type (HCC) Obesity with co morbidities- long discussion about weight loss, diet, and exercise - Lipid panel - Hemoglobin A1c - Insulin, fasting   Hyperlipidemia -continue medications, check lipids, decrease fatty foods, increase activity.  - willing to get on low dose pravastatin if he needs it, 2-3 days a week - Lipid panel  Diabetes - does not want medications at this time, will work on diet Discussed general issues about diabetes pathophysiology and management., Educational material distributed., Suggested low cholesterol diet., Encouraged aerobic exercise., Discussed foot care., Reminded to get yearly retinal exam. - Hemoglobin A1c - Insulin, fasting  Vitamin D deficiency - VITAMIN D 25 Hydroxy (Vit-D Deficiency, Fractures)  Medication management - Magnesium   Screening PSA (prostate specific antigen) - PSA   History of nephrolithiasis monitor   Encounter for general adult medical examination with abnormal findings 1 year Colonoscopy due next year  DDD (degenerative disc disease), lumbar Follows Dr. Channing Muttersoy  Aortic atherosclerosis (HCC) Control blood pressure, cholesterol, glucose, increase exercise.  Get on cholesterol medication  Screening PSA (prostate specific antigen) -     PSA  Discussed med's effects and SE's. Screening labs and tests as requested with regular follow-up as recommended. Over 40 minutes of exam, counseling, chart review and critical decision making was performed  HPI Patient presents for a complete physical.    His blood pressure has been controlled at  home, today their BP is BP: 116/78 He does not workout due to back pain. He denies chest pain, shortness of breath, dizziness.   He had lower back surgery 03/2014 with Dr. Channing Muttersoy has fusion L1-L2, L4-5, L5-S1, he had knee surgery with Dr. Jerl Santosalldorf in sept 2018, Has had bilateral hip replacements.  Continues to have pain.  He is not on cholesterol medication, states intolerant to statins, has tried livalo, lipitor, and denies myalgias. His cholesterol is not at goal. The cholesterol last visit was:   Lab Results  Component Value Date   CHOL 256 (H) 10/17/2016   HDL 43 10/17/2016   LDLCALC 191 (H) 04/28/2016   TRIG 155 (H) 10/17/2016   CHOLHDL 6.0 (H) 10/17/2016   He has been working on diet and exercise for prediabetes, he is not on bASA, he states metformin constipated.  he is on ACE/ARB and denies paresthesia of the feet, polydipsia, polyuria and visual disturbances. Last A1C in the office was:  Lab Results  Component Value Date   HGBA1C 7.1 (H) 10/17/2016   Lab Results  Component Value Date   GFRNONAA 93 10/17/2016   Patient is on Vitamin D supplement.   Lab Results  Component Value Date   VD25OH 37 04/28/2016   Last PSA was: Lab Results  Component Value Date   PSA 1.06 05/08/2015   BMI is Body mass index is 42.18 kg/m., he is working on diet and exercise. Wt Readings from Last 3 Encounters:  04/07/17 257 lb 6.4 oz (116.8 kg)  10/17/16 261 lb 12.8 oz (118.8 kg)  04/28/16 265 lb (120.2 kg)    Current Medications:  Current Outpatient Medications on File Prior to Visit  Medication Sig Dispense Refill  . amLODipine (NORVASC) 10 MG  tablet TAKE ONE TABLET BY MOUTH DAILY 30 tablet 0  . Cholecalciferol (VITAMIN D PO) Take 4,000 Units by mouth daily.     . cyanocobalamin 500 MCG tablet Take 500 mcg by mouth daily.    . fluticasone (FLONASE) 50 MCG/ACT nasal spray Place 2 sprays into both nostrils at bedtime. 16 g 1  . L-LYSINE PO Take 1 tablet by mouth daily.    Marland Kitchen lisinopril  (PRINIVIL,ZESTRIL) 20 MG tablet TAKE ONE TABLET BY MOUTH DAILY 90 tablet 1  . loratadine (CLARITIN) 10 MG tablet TAKE ONE TABLET BY MOUTH DAILY 90 tablet 1  . montelukast (SINGULAIR) 10 MG tablet TAKE ONE TABLET BY MOUTH DAILY 90 tablet 1  . omeprazole (PRILOSEC) 40 MG capsule TAKE ONE CAPSULE BY MOUTH TWICE A DAY 180 capsule 1  . metaxalone (SKELAXIN) 800 MG tablet Take 800 mg by mouth 3 (three) times daily.     No current facility-administered medications on file prior to visit.    Health Maintenance:  Immunization History  Administered Date(s) Administered  . DTaP 01/13/2010  . Influenza Whole 02/21/2012  . 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  Dentist: rare  Patient Care Team: Lucky Cowboy, MD as PCP - General (Internal Medicine) Emeline General, MD as Referring Physician (Neurosurgery) Lyn Records, MD as Consulting Physician (Cardiology) Sharrell Ku, MD as Consulting Physician (Gastroenterology) Marcine Matar, MD as Consulting Physician (Urology)   Medical History:  Past Medical History:  Diagnosis Date  . DDD lumbar   . DJD (degenerative joint disease)   . GERD (gastroesophageal reflux disease)    takes Omeprazole daily  . Hyperlipidemia   . Hypertension    takes Lisinopril and Amlodipine daily  . Multiple allergies    takes Claritin and Singulair daily  . PONV (postoperative nausea and vomiting)    Allergies Allergies  Allergen Reactions  . Statins Other (See Comments)    Flu-like symptoms  . Tylenol [Acetaminophen]     Liver enzymes elevated so patient has stopped taking anything with acetaminophen in it  . Phentermine Other (See Comments)    constipation    SURGICAL HISTORY He  has a past surgical history that includes Ankle fracture surgery (1995); Colonoscopy; Knee arthroscopy  (Left, 04/24/2012); Elbow ligament reconstruction (Right, 2015); Knee arthroscopy with medial menisectomy (Left, 10/28/2014); Chondroplasty (Left, 10/28/2014); Back surgery (2002); Back surgery (03-2014); Cardiac catheterization; Total hip arthroplasty (Right, 09/22/2015); and Total hip arthroplasty (Left, 01/05/2016). FAMILY HISTORY His family history includes Cancer in his father and maternal grandmother; Dementia in his mother; Diabetes in his sister; Heart disease in his father; Hyperlipidemia in his father; Hypertension in his father and mother. SOCIAL HISTORY He  reports that he quit smoking about 42 years ago. he has never used smokeless tobacco. He reports that he does not drink alcohol or use drugs. Review of Systems:  Review of Systems  Constitutional: Negative.   HENT: Negative.   Eyes: Negative.   Respiratory: Negative.   Cardiovascular: Negative.   Gastrointestinal: Negative.   Genitourinary: Negative.   Musculoskeletal: Positive for back pain, joint pain and myalgias.  Skin: Negative.   Neurological: Negative.   Endo/Heme/Allergies: Negative.   Psychiatric/Behavioral: Negative.     Physical Exam: Estimated body mass index is 42.18 kg/m as calculated from the following:   Height as of this encounter: 5' 5.5" (1.664 m).  Weight as of this encounter: 257 lb 6.4 oz (116.8 kg). BP 116/78   Pulse 72   Temp (!) 97.5 F (36.4 C)   Resp 18   Ht 5' 5.5" (1.664 m)   Wt 257 lb 6.4 oz (116.8 kg)   BMI 42.18 kg/m  General Appearance: Well nourished, in no apparent distress.  Eyes: PERRLA, EOMs, conjunctiva no swelling or erythema, normal fundi and vessels.  Sinuses: No Frontal/maxillary tenderness  ENT/Mouth: Ext aud canals clear, normal light reflex with TMs without erythema, bulging. Good dentition. No erythema, swelling, or exudate on post pharynx. Tonsils not swollen or erythematous. Hearing normal.  Neck: Supple, thyroid normal. No bruits  Respiratory: Respiratory effort normal,  BS equal bilaterally without rales, rhonchi, wheezing or stridor.  Cardio: RRR without murmurs, rubs or gallops. Brisk peripheral pulses without edema.  Chest: symmetric, with normal excursions and percussion.  Abdomen: Soft, nontender, no guarding, rebound, hernias, masses, or organomegaly.  Lymphatics: Non tender without lymphadenopathy.  Genitourinary: defer Musculoskeletal: Full ROM all peripheral extremities,5/5 strength, and normal gait.  Skin: Warm, dry without rashes, lesions, ecchymosis. Neuro: Cranial nerves intact, reflexes equal bilaterally. Normal muscle tone, no cerebellar symptoms. Sensation intact.  Psych: Awake and oriented X 3, normal affect, Insight and Judgment appropriate.   EKG: declines AORTA SCAN:  declines  Quentin Mulling 11:01 AM [facility]

## 2017-04-07 ENCOUNTER — Ambulatory Visit: Payer: BLUE CROSS/BLUE SHIELD | Admitting: Physician Assistant

## 2017-04-07 VITALS — BP 116/78 | HR 72 | Temp 97.5°F | Resp 18 | Ht 65.5 in | Wt 257.4 lb

## 2017-04-07 DIAGNOSIS — M5136 Other intervertebral disc degeneration, lumbar region: Secondary | ICD-10-CM

## 2017-04-07 DIAGNOSIS — Z125 Encounter for screening for malignant neoplasm of prostate: Secondary | ICD-10-CM | POA: Diagnosis not present

## 2017-04-07 DIAGNOSIS — Z87442 Personal history of urinary calculi: Secondary | ICD-10-CM

## 2017-04-07 DIAGNOSIS — E1169 Type 2 diabetes mellitus with other specified complication: Secondary | ICD-10-CM

## 2017-04-07 DIAGNOSIS — I10 Essential (primary) hypertension: Secondary | ICD-10-CM

## 2017-04-07 DIAGNOSIS — E559 Vitamin D deficiency, unspecified: Secondary | ICD-10-CM

## 2017-04-07 DIAGNOSIS — I700 Atherosclerosis of aorta: Secondary | ICD-10-CM | POA: Insufficient documentation

## 2017-04-07 DIAGNOSIS — E785 Hyperlipidemia, unspecified: Secondary | ICD-10-CM

## 2017-04-07 DIAGNOSIS — Z79899 Other long term (current) drug therapy: Secondary | ICD-10-CM

## 2017-04-07 DIAGNOSIS — Z0000 Encounter for general adult medical examination without abnormal findings: Secondary | ICD-10-CM

## 2017-04-07 DIAGNOSIS — Z136 Encounter for screening for cardiovascular disorders: Secondary | ICD-10-CM

## 2017-04-07 NOTE — Patient Instructions (Signed)
Mini Habits for weight loss- get the book and read it  Intermittent fasting is more about strategy than starvation. It's meant to reset your body in different ways, hopefully with fitness and nutrition changes as a result.  Like any big switchover, though, results may vary when it comes down to the individual level. What works for your friends may not work for you, or vice versa. That's why it's helpful to play around with variations on intermittent fasting and healthy habits and find what works best for you.  WHAT IS INTERMITTENT FASTING AND WHY DO IT?  Intermittent fasting doesn't involve specific foods, but rather, a strict schedule regarding when you eat. Also called "time-restricted eating," the tactic has been praised for its contribution to weight loss, improved body composition, and decreased cravings. Preliminary research also suggests it may be beneficial for glucose tolerance, hormone regulation, better muscle mass and lower body fat.  Part of its appeal is the simplicity of the effort. Unlike some other trends, there's no calculations to intermittent fasting.  You simply eat within a certain block of time, usually a window of 8-10 hours. In the other big block of time - about 14-16 hours, including when you're asleep - you don't eat anything, not even snacks. You can drink water, coffee, tea or any other beverage that doesn't have calories.  For example, if you like having a late dinner, you might skip breakfast and have your first meal at noon and your last meal of the day at 8 p.m., and then not eat until noon again the next day.  IDEAS FOR GETTING STARTED  If you're new to the strategy, it may be helpful to eat within the typical circadian rhythm and keep eating within daylight hours. This can be especially beneficial if you're looking at intermittent fasting for weight-loss goals.  So first try only eating between 12pm to 8pm.  Outside of this time you may have water, black  coffee, and hot tea. You may not eat it drink anything that has carbs, sugars, OR artificial sugars like diet soda.   Like any major eating and fitness shift, it can take time to find the perfect fit, so don't be afraid to experiment with different options - including ditching intermittent fasting altogether if it's simply not for you. But if it is, you may be surprised by some of the benefits that come along with the strategy.  Dining out: help on how to choose  It is better if you can meal plan and eat at your house but sometimes life happens so here are some tips to help you make healthier choices while eating out:  1) Ask for your server to pack up 1/2 of your meal to take home for lunch or later. Portion sizes are huge so if you don't have all of it in front of you, you are less likely to eat it all.    2) Ask if they have a smaller portion or lunch portion available, this is often cheaper as well!  3) Ask to not have the bread basket brought to the table  4) Ask for the dressing on the side.   5) Look at the nutrition menu BEFORE you go to a restaurant or fast food chain and have what you will eat in mind. You can also look it up on food tracking ups like Myfitness Pal or Lost it. However the web site for the restaurant is usually the best bet.   6) Don't forget  that you are the costumer, it is okay to ask them to leave things out, ask about substituting, or ask them to [place] with less oil!  Here is some more general information for you!     Diet soda leads to weight gain.  We recently discovered that the artifical sugar in the soda stops an enzyme in your stomach that is suppose to signal that your brain is full. So patients that drink a lot of diet soda will never feel full and tend to over eat. So please cut back on diet soda and it can help with your weight loss.   Veggies are great because you can eat a ton! They are low in calories, great to fill you up, and have a ton of  vitamins, minerals, and protein.      Diabetes is a very complicated disease...lets simplify it.  An easy way to look at it to understand the complications is if you think of the extra sugar floating in your blood stream as glass shards floating through your blood stream.    Diabetes affects your small vessels first: 1) The glass shards (sugar) scraps down the tiny blood vessels in your eyes and lead to diabetic retinopathy, the leading cause of blindness in the [place]. Diabetes is the leading cause of newly diagnosed adult (52 to 60 years of age) blindness in the [place].  2) The glass shards scratches down the tiny vessels of your legs leading to nerve damage called neuropathy and can lead to amputations of your feet. More than 60% of all non-traumatic amputations of lower limbs occur in people with diabetes.  3) Over time the small vessels in your brain are shredded and closed off, individually this does not cause any problems but over a long period of time many of the small vessels being blocked can lead to Vascular Dementia.   4) Your kidney's are a filter system and have a "net" that keeps certain things in the body and lets bad things out. Sugar shreds this net and leads to kidney damage and eventually failure. Decreasing the sugar that is destroying the net and certain blood pressure medications can help stop or decrease progression of kidney disease. Diabetes was the primary cause of kidney failure in 44 percent of all new cases in 2011.  5) Diabetes also destroys the small vessels in your penis that lead to erectile dysfunction. Eventually the vessels are so damaged that you may not be responsive to cialis or viagra.   Diabetes and your large vessels: Your larger vessels consist of your coronary arteries in your heart and the carotid vessels to your brain. Diabetes or even increased sugars put you at 300% increased risk of heart attack and stroke and this is why.. The sugar scrapes  down your large blood vessels and your body sees this as an internal injury and tries to repair itself. Just like you get a scab on your skin, your platelets will stick to the blood vessel wall trying to heal it. This is why we have diabetics on low dose aspirin daily, this prevents the platelets from sticking and can prevent plaque formation. In addition, your body takes cholesterol and tries to shove it into the open wound. This is why we want your LDL, or bad cholesterol, below 70.   The combination of platelets and cholesterol over 5-10 years forms plaque that can break off and cause a heart attack or stroke.   PLEASE REMEMBER:  Diabetes is preventable!  Up to 85 percent of complications and morbidities among individuals with type 2 diabetes can be prevented, delayed, or effectively treated and minimized with regular visits to a health professional, appropriate monitoring and medication, and a healthy diet and lifestyle.

## 2017-04-08 LAB — CBC WITH DIFFERENTIAL/PLATELET
Basophils Absolute: 40 cells/uL (ref 0–200)
Basophils Relative: 0.5 %
Eosinophils Absolute: 150 cells/uL (ref 15–500)
Eosinophils Relative: 1.9 %
HCT: 45.7 % (ref 38.5–50.0)
Hemoglobin: 15.4 g/dL (ref 13.2–17.1)
Lymphs Abs: 2394 cells/uL (ref 850–3900)
MCH: 27.5 pg (ref 27.0–33.0)
MCHC: 33.7 g/dL (ref 32.0–36.0)
MCV: 81.8 fL (ref 80.0–100.0)
MPV: 11 fL (ref 7.5–12.5)
Monocytes Relative: 6.3 %
Neutro Abs: 4819 cells/uL (ref 1500–7800)
Neutrophils Relative %: 61 %
Platelets: 318 10*3/uL (ref 140–400)
RBC: 5.59 10*6/uL (ref 4.20–5.80)
RDW: 13.8 % (ref 11.0–15.0)
Total Lymphocyte: 30.3 %
WBC mixed population: 498 cells/uL (ref 200–950)
WBC: 7.9 10*3/uL (ref 3.8–10.8)

## 2017-04-08 LAB — BASIC METABOLIC PANEL WITH GFR
BUN: 16 mg/dL (ref 7–25)
CO2: 25 mmol/L (ref 20–32)
Calcium: 9.6 mg/dL (ref 8.6–10.3)
Chloride: 106 mmol/L (ref 98–110)
Creat: 0.94 mg/dL (ref 0.70–1.33)
GFR, Est African American: 102 mL/min/{1.73_m2} (ref >=60)
GFR, Est Non African American: 88 mL/min/{1.73_m2} (ref >=60)
Glucose, Bld: 107 mg/dL — ABNORMAL HIGH (ref 65–99)
Potassium: 4.8 mmol/L (ref 3.5–5.3)
Sodium: 140 mmol/L (ref 135–146)

## 2017-04-08 LAB — HEPATIC FUNCTION PANEL
AG Ratio: 1.6 (calc) (ref 1.0–2.5)
ALT: 17 U/L (ref 9–46)
AST: 15 U/L (ref 10–35)
Albumin: 4.4 g/dL (ref 3.6–5.1)
Alkaline phosphatase (APISO): 118 U/L — ABNORMAL HIGH (ref 40–115)
Bilirubin, Direct: 0.1 mg/dL (ref 0.0–0.2)
Globulin: 2.7 g/dL (calc) (ref 1.9–3.7)
Indirect Bilirubin: 0.5 mg/dL (calc) (ref 0.2–1.2)
Total Bilirubin: 0.6 mg/dL (ref 0.2–1.2)
Total Protein: 7.1 g/dL (ref 6.1–8.1)

## 2017-04-08 LAB — LIPID PANEL
Cholesterol: 255 mg/dL — ABNORMAL HIGH (ref <200)
HDL: 44 mg/dL (ref >40)
LDL Cholesterol (Calc): 182 mg/dL (calc) — ABNORMAL HIGH
Non-HDL Cholesterol (Calc): 211 mg/dL (calc) — ABNORMAL HIGH (ref <130)
Total CHOL/HDL Ratio: 5.8 (calc) — ABNORMAL HIGH (ref <5.0)
Triglycerides: 147 mg/dL (ref <150)

## 2017-04-08 LAB — URINALYSIS, ROUTINE W REFLEX MICROSCOPIC
Bilirubin Urine: NEGATIVE
Glucose, UA: NEGATIVE
Hgb urine dipstick: NEGATIVE
Ketones, ur: NEGATIVE
Leukocytes, UA: NEGATIVE
Nitrite: NEGATIVE
Protein, ur: NEGATIVE
Specific Gravity, Urine: 1.017 (ref 1.001–1.03)
pH: <=5 (ref 5.0–8.0)

## 2017-04-08 LAB — TSH: TSH: 1.46 mIU/L (ref 0.40–4.50)

## 2017-04-08 LAB — PSA: PSA: 0.9 ng/mL (ref <=4.0)

## 2017-04-08 LAB — HEMOGLOBIN A1C
Hgb A1c MFr Bld: 6.5 % of total Hgb — ABNORMAL HIGH (ref <5.7)
Mean Plasma Glucose: 140 (calc)
eAG (mmol/L): 7.7 (calc)

## 2017-04-08 LAB — MICROALBUMIN / CREATININE URINE RATIO
Creatinine, Urine: 106 mg/dL (ref 20–320)
Microalb Creat Ratio: 96 mcg/mg creat — ABNORMAL HIGH (ref <30)
Microalb, Ur: 10.2 mg/dL

## 2017-04-13 ENCOUNTER — Encounter: Payer: BLUE CROSS/BLUE SHIELD | Admitting: Physician Assistant

## 2017-04-16 ENCOUNTER — Other Ambulatory Visit: Payer: Self-pay | Admitting: Internal Medicine

## 2017-05-04 ENCOUNTER — Other Ambulatory Visit: Payer: Self-pay | Admitting: Internal Medicine

## 2017-05-29 ENCOUNTER — Telehealth: Payer: Self-pay | Admitting: *Deleted

## 2017-05-29 MED ORDER — PREDNISONE 20 MG PO TABS: ORAL_TABLET | ORAL | 0 refills | Status: DC

## 2017-05-29 NOTE — Telephone Encounter (Signed)
John Andrade called today said he has a rash again on his arms some on legs started a few days ago.  Says it seems to happen every year around this time of the year & a allergy flare up he said normally you treat him with prednisone & it will go away. " both the rash & the allergy problem" hes asking if you will call in rx for prednisone? If so [facility]

## 2017-06-05 ENCOUNTER — Other Ambulatory Visit: Payer: Self-pay | Admitting: Internal Medicine

## 2017-07-18 IMAGING — XA DG FLUORO GUIDE NDL PLC/BX
1 series · 1 of 1 positions shown · non-contrast
Comparison: none

CLINICAL DATA: Bilateral hip pain.

[Series 1: ortho standard · 1 of 1 slices shown]
[im 1/1]
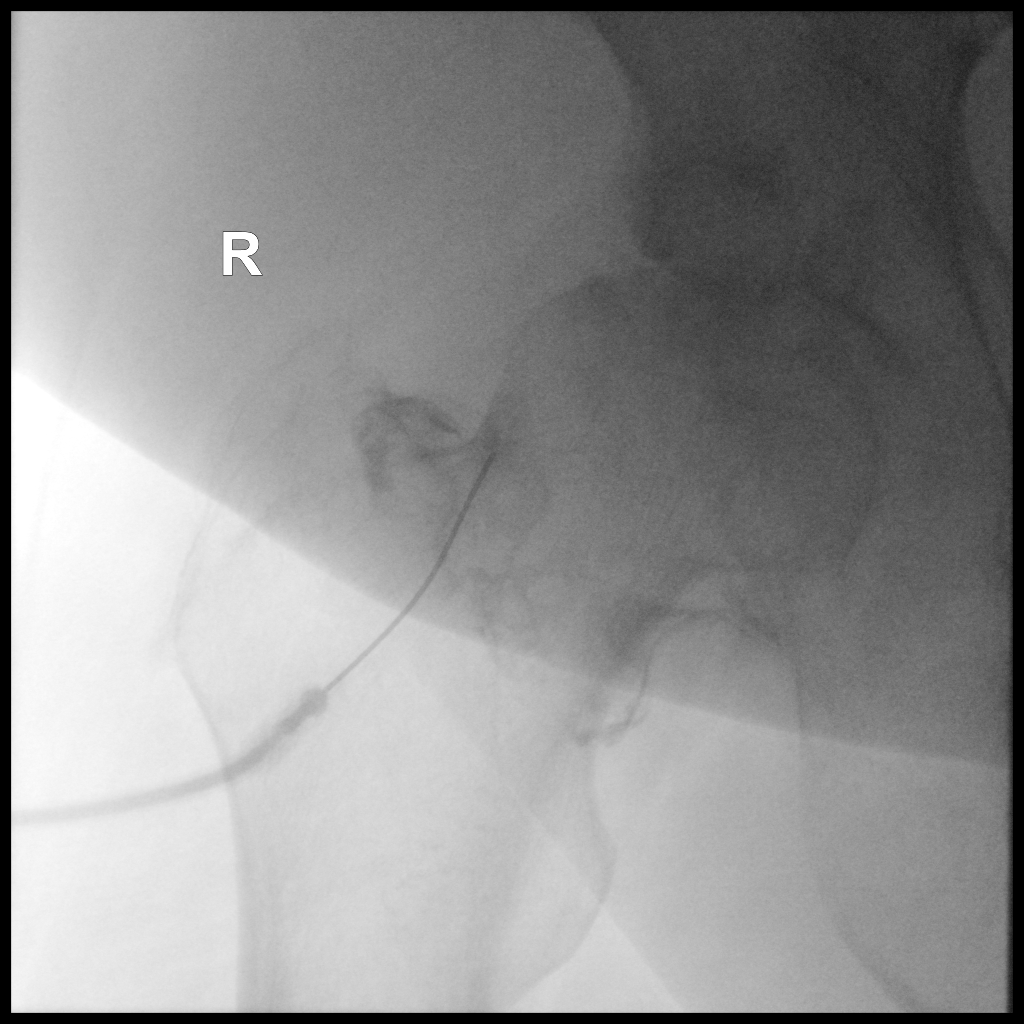

[1 of 1 positions shown; findings below may reference images not displayed]

EXAM:
RIGHT HIP INJECTION UNDER FLUOROSCOPY

FLUOROSCOPY TIME:  Radiation Exposure Index (as provided by the
fluoroscopic device): 102.86 microGray*m^2

Fluoroscopy Time (in minutes and seconds):  16 seconds

PROCEDURE:
Overlying skin prepped with Betadine, draped in the usual sterile
fashion, and infiltrated locally with buffered Lidocaine. A 3.5 inch
22 gauge spinal needle was advanced to the lateral aspect of the
right femoral head - neck junction. 1 ml of Lidocaine injected
easily. Injection of 1.5 mL of Isovue M 200 contrast material
demonstrates intra-articular spread without intravascular component.

120 mg Depo-Medrol and 3 mL of 1% lidocaine were then administered.
No immediate complication.
IMPRESSION: Technically successful right hip injection under fluoroscopy.

## 2017-07-31 DIAGNOSIS — K219 Gastro-esophageal reflux disease without esophagitis: Secondary | ICD-10-CM | POA: Insufficient documentation

## 2017-07-31 NOTE — Progress Notes (Deleted)
FOLLOW UP  Assessment and Plan:   Hypertension Well controlled with current medications  Monitor blood pressure at home; patient to call if consistently greater than 130/80 Continue DASH diet.   Reminder to go to the ER if any CP, SOB, nausea, dizziness, severe HA, changes vision/speech, left arm numbness and tingling and jaw pain.  Cholesterol Currently above goal; he is statin intolerant; *** Continue low cholesterol diet and exercise.  Check lipid panel.   Diabetes without complications Currently attempting lifestyle modification Continue diet and exercise.  Perform daily foot/skin check, notify office of any concerning changes.  Check A1C  Obesity with co morbidities Long discussion about weight loss, diet, and exercise Recommended diet heavy in fruits and veggies and low in animal meats, cheeses, and dairy products, appropriate calorie intake Discussed ideal weight for height (below ***) and initial weight goal (***) Patient will work on *** Will follow up in 3 months  Vitamin D Def Below goal at last visit; continue supplementation to maintain goal of 70-100 Check Vit D level  GERD ***  Continue diet and meds as discussed. Further disposition pending results of labs. Discussed med's effects and SE's.   Over 30 minutes of exam, counseling, chart review, and critical decision making was performed.   Future Appointments  Date Time Provider Department Center  08/01/2017 10:30 AM Judd Gaudier, NP [facility] None  04/16/2018 10:00 AM Quentin Mulling, PA-C [facility] None    ----------------------------------------------------------------------------------------------------------------------  HPI 62 y.o. male  presents for 3 month follow up on hypertension, cholesterol, diabetes, morbid obesity and vitamin D deficiency.   he has a diagnosis of GERD which is currently managed by omeprazole 40 mg BID *** he reports symptoms {ACTION; ARE/ARE WUJ:81191478} currently  well controlled, and denies breakthrough reflux, burning in chest, hoarseness or cough.    BMI is There is no height or weight on file to calculate BMI., he {HAS HAS GNF:62130} been working on diet and exercise. Wt Readings from Last 3 Encounters:  04/07/17 257 lb 6.4 oz (116.8 kg)  10/17/16 261 lb 12.8 oz (118.8 kg)  04/28/16 265 lb (120.2 kg)   His blood pressure {HAS HAS NOT:18834} been controlled at home, today their BP is    He {DOES_DOES QMV:78469} workout. He denies chest pain, shortness of breath, dizziness.   He {ACTION; IS/IS GEX:52841324} on cholesterol medication (intolerant of statins, ***). His cholesterol is not at goal. The cholesterol last visit was:   Lab Results  Component Value Date   CHOL 255 (H) 04/07/2017   HDL 44 04/07/2017   LDLCALC 182 (H) 04/07/2017   TRIG 147 04/07/2017   CHOLHDL 5.8 (H) 04/07/2017    He {Has/has not:18111} been working on diet and exercise forT2 diabetes, and denies {Symptoms; diabetes w/o none:19199}. Last A1C in the office was:  Lab Results  Component Value Date   HGBA1C 6.5 (H) 04/07/2017   Patient is on Vitamin D supplement.   Lab Results  Component Value Date   VD25OH 37 04/28/2016        Current Medications:  Current Outpatient Medications on File Prior to Visit  Medication Sig  . amLODipine (NORVASC) 10 MG tablet TAKE ONE TABLET BY MOUTH DAILY  . amLODipine (NORVASC) 10 MG tablet TAKE ONE TABLET BY MOUTH DAILY  . amLODipine (NORVASC) 10 MG tablet TAKE ONE TABLET BY MOUTH DAILY  . Cholecalciferol (VITAMIN D PO) Take 4,000 Units by mouth daily.   . cyanocobalamin 500 MCG tablet Take 500 mcg by mouth daily.  Marland Kitchen  fluticasone (FLONASE) 50 MCG/ACT nasal spray Place 2 sprays into both nostrils at bedtime.  Marland Kitchen. L-LYSINE PO Take 1 tablet by mouth daily.  Marland Kitchen. lisinopril (PRINIVIL,ZESTRIL) 20 MG tablet TAKE ONE TABLET BY MOUTH DAILY  . loratadine (CLARITIN) 10 MG tablet TAKE ONE TABLET BY MOUTH DAILY  . metaxalone (SKELAXIN) 800 MG  tablet Take 800 mg by mouth 3 (three) times daily.  . montelukast (SINGULAIR) 10 MG tablet TAKE ONE TABLET BY MOUTH DAILY  . omeprazole (PRILOSEC) 40 MG capsule TAKE ONE CAPSULE BY MOUTH TWICE A DAY  . predniSONE (DELTASONE) 20 MG tablet 2 tablets daily for 3 days, 1 tablet daily for 4 days.   No current facility-administered medications on file prior to visit.      Allergies:  Allergies  Allergen Reactions  . Statins Other (See Comments)    Flu-like symptoms  . Tylenol [Acetaminophen]     Liver enzymes elevated so patient has stopped taking anything with acetaminophen in it  . Phentermine Other (See Comments)    constipation     Medical History:  Past Medical History:  Diagnosis Date  . DDD lumbar   . DJD (degenerative joint disease)   . GERD (gastroesophageal reflux disease)    takes Omeprazole daily  . Hyperlipidemia   . Hypertension    takes Lisinopril and Amlodipine daily  . Multiple allergies    takes Claritin and Singulair daily  . PONV (postoperative nausea and vomiting)    Family history- Reviewed and unchanged Social history- Reviewed and unchanged   Review of Systems:  Review of Systems  Constitutional: Negative for malaise/fatigue and weight loss.  HENT: Negative for hearing loss and tinnitus.   Eyes: Negative for blurred vision and double vision.  Respiratory: Negative for cough, shortness of breath and wheezing.   Cardiovascular: Negative for chest pain, palpitations, orthopnea, claudication and leg swelling.  Gastrointestinal: Negative for abdominal pain, blood in stool, constipation, diarrhea, heartburn, melena, nausea and vomiting.  Genitourinary: Negative.   Musculoskeletal: Negative for joint pain and myalgias.  Skin: Negative for rash.  Neurological: Negative for dizziness, tingling, sensory change, weakness and headaches.  Endo/Heme/Allergies: Negative for polydipsia.  Psychiatric/Behavioral: Negative.   All other systems reviewed and are  negative.     Physical Exam: There were no vitals taken for this visit. Wt Readings from Last 3 Encounters:  04/07/17 257 lb 6.4 oz (116.8 kg)  10/17/16 261 lb 12.8 oz (118.8 kg)  04/28/16 265 lb (120.2 kg)   General Appearance: Well nourished, in no apparent distress. Eyes: PERRLA, EOMs, conjunctiva no swelling or erythema Sinuses: No Frontal/maxillary tenderness ENT/Mouth: Ext aud canals clear, TMs without erythema, bulging. No erythema, swelling, or exudate on post pharynx.  Tonsils not swollen or erythematous. Hearing normal.  Neck: Supple, thyroid normal.  Respiratory: Respiratory effort normal, BS equal bilaterally without rales, rhonchi, wheezing or stridor.  Cardio: RRR with no MRGs. Brisk peripheral pulses without edema.  Abdomen: Soft, + BS.  Non tender, no guarding, rebound, hernias, masses. Lymphatics: Non tender without lymphadenopathy.  Musculoskeletal: Full ROM, 5/5 strength, {PSY - GAIT AND STATION:22860} gait Skin: Warm, dry without rashes, lesions, ecchymosis.  Neuro: Cranial nerves intact. No cerebellar symptoms.  Psych: Awake and oriented X 3, normal affect, Insight and Judgment appropriate.    Dan MakerAshley C Bartholomew Ramesh, NP 8:32 AM [facility]

## 2017-08-01 ENCOUNTER — Ambulatory Visit: Payer: Self-pay | Admitting: Adult Health

## 2017-09-11 ENCOUNTER — Other Ambulatory Visit: Payer: Self-pay | Admitting: Internal Medicine

## 2017-09-14 ENCOUNTER — Other Ambulatory Visit: Payer: Self-pay | Admitting: Adult Health

## 2017-09-14 MED ORDER — AMLODIPINE BESYLATE 10 MG PO TABS: 10.0000 mg | ORAL_TABLET | Freq: Every day | ORAL | 0 refills | Status: DC

## 2017-09-14 MED ORDER — LISINOPRIL 20 MG PO TABS: 20.0000 mg | ORAL_TABLET | Freq: Every day | ORAL | 0 refills | Status: DC

## 2017-09-14 NOTE — Progress Notes (Signed)
FOLLOW UP  Assessment and Plan:   Hypertension Well controlled with current medications  Monitor blood pressure at home; patient to call if consistently greater than 130/80 Continue DASH diet.   Reminder to go to the ER if any CP, SOB, nausea, dizziness, severe HA, changes vision/speech, left arm numbness and tingling and jaw pain.  Cholesterol Currently severely above goal, not on medication; has documented statin intolerance; will try zetia 10 mg daily Continue low cholesterol diet and exercise.  Check lipid panel.   Diabetes without complications Continue medication: currently treated by lifestyle only for borderline A1Cs Continue diet and exercise.  Perform daily foot/skin check, notify office of any concerning changes.  Check A1C  Obesity with co morbidities Long discussion about weight loss, diet, and exercise Recommended diet heavy in fruits and veggies and low in animal meats, cheeses, and dairy products, appropriate calorie intake Discussed ideal weight for height and initial weight goal (245 lb) Patient will work on cutting down on carbs, high fiber, low glycemic index Will follow up in 3 months  Vitamin D Def At goal at last visit;  Defer Vit D level to CPE  Continue diet and meds as discussed. Further disposition pending results of labs. Discussed med's effects and SE's.   Over 30 minutes of exam, counseling, chart review, and critical decision making was performed.   Future Appointments  Date Time Provider Department Center  04/16/2018 10:00 AM Quentin Mulling, PA-C [facility] None    ----------------------------------------------------------------------------------------------------------------------  HPI 62 y.o. male  presents for 3 month follow up on hypertension, cholesterol, diabetes, morbid obesity and vitamin D deficiency.   BMI is Body mass index is 41.79 kg/m., he has been working on diet, hasn't had red meat since August of 2018, he knows he needs  to work on carbs, exercise is limited due to orthopedic conditions.  Wt Readings from Last 3 Encounters:  09/15/17 255 lb (115.7 kg)  04/07/17 257 lb 6.4 oz (116.8 kg)  10/17/16 261 lb 12.8 oz (118.8 kg)   His blood pressure has been controlled at home, today their BP is BP: 138/88  He does not workout. He denies chest pain, shortness of breath, dizziness.   He is not on cholesterol medication and denies myalgias. His cholesterol is not at goal. The cholesterol last visit was:   Lab Results  Component Value Date   CHOL 255 (H) 04/07/2017   HDL 44 04/07/2017   LDLCALC 182 (H) 04/07/2017   TRIG 147 04/07/2017   CHOLHDL 5.8 (H) 04/07/2017    He has been working on diet for T2 diabetes, and denies foot ulcerations, increased appetite, nausea, paresthesia of the feet, polydipsia, polyuria, visual disturbances, vomiting and weight loss. Last A1C in the office was:  Lab Results  Component Value Date   HGBA1C 6.5 (H) 04/07/2017   Patient is on Vitamin D supplement.   Lab Results  Component Value Date   VD25OH 37 04/28/2016        Current Medications:  Current Outpatient Medications on File Prior to Visit  Medication Sig  . amLODipine (NORVASC) 10 MG tablet Take 1 tablet (10 mg total) by mouth daily.  . Cholecalciferol (VITAMIN D PO) Take 2,000 Units by mouth daily.   . cyanocobalamin 500 MCG tablet Take 500 mcg by mouth daily.  Marland Kitchen L-LYSINE PO Take 500 mg by mouth daily.   Marland Kitchen lisinopril (PRINIVIL,ZESTRIL) 20 MG tablet Take 1 tablet (20 mg total) by mouth daily.  Marland Kitchen loratadine (CLARITIN) 10 MG tablet TAKE  ONE TABLET BY MOUTH DAILY  . montelukast (SINGULAIR) 10 MG tablet TAKE ONE TABLET BY MOUTH DAILY  . omeprazole (PRILOSEC) 40 MG capsule TAKE ONE CAPSULE BY MOUTH TWICE A DAY   No current facility-administered medications on file prior to visit.      Allergies:  Allergies  Allergen Reactions  . Statins Other (See Comments)    Flu-like symptoms  . Tylenol [Acetaminophen]      Liver enzymes elevated so patient has stopped taking anything with acetaminophen in it  . Phentermine Other (See Comments)    constipation     Medical History:  Past Medical History:  Diagnosis Date  . DDD lumbar   . DJD (degenerative joint disease)   . GERD (gastroesophageal reflux disease)    takes Omeprazole daily  . Hyperlipidemia   . Hypertension    takes Lisinopril and Amlodipine daily  . Multiple allergies    takes Claritin and Singulair daily  . PONV (postoperative nausea and vomiting)    Family history- Reviewed and unchanged Social history- Reviewed and unchanged   Review of Systems:  Review of Systems  Constitutional: Negative for malaise/fatigue and weight loss.  HENT: Negative for hearing loss and tinnitus.   Eyes: Negative for blurred vision and double vision.  Respiratory: Negative for cough, shortness of breath and wheezing.   Cardiovascular: Negative for chest pain, palpitations, orthopnea, claudication and leg swelling.  Gastrointestinal: Negative for abdominal pain, blood in stool, constipation, diarrhea, heartburn, melena, nausea and vomiting.  Genitourinary: Negative.   Musculoskeletal: Positive for back pain and joint pain (bilateral hips, followed by Dr. Fara Chutealdorff). Negative for myalgias.  Skin: Negative for rash.  Neurological: Negative for dizziness, tingling, sensory change, weakness and headaches.  Endo/Heme/Allergies: Negative for polydipsia.  Psychiatric/Behavioral: Negative.   All other systems reviewed and are negative.     Physical Exam: BP 138/88   Pulse (!) 57   Temp 97.7 F (36.5 C)   Ht 5' 5.5" (1.664 m)   Wt 255 lb (115.7 kg)   SpO2 98%   BMI 41.79 kg/m  Wt Readings from Last 3 Encounters:  09/15/17 255 lb (115.7 kg)  04/07/17 257 lb 6.4 oz (116.8 kg)  10/17/16 261 lb 12.8 oz (118.8 kg)   General Appearance: morbidly obese, in no apparent distress. Eyes: PERRLA, EOMs, conjunctiva no swelling or erythema Sinuses: No  Frontal/maxillary tenderness ENT/Mouth: Ext aud canals clear, TMs without erythema, bulging. No erythema, swelling, or exudate on post pharynx.  Tonsils not swollen or erythematous. Hearing normal.  Neck: Supple, thyroid normal.  Respiratory: Respiratory effort normal, BS equal bilaterally without rales, rhonchi, wheezing or stridor.  Cardio: RRR with no MRGs. Brisk peripheral pulses without edema.  Abdomen: Soft, obese, + BS.  Non tender, no guarding, rebound, hernias, masses. Lymphatics: Non tender without lymphadenopathy.  Musculoskeletal: Full ROM, 5/5 strength, Normal gait Skin: Warm, dry without rashes, lesions, ecchymosis.  Neuro: Cranial nerves intact. No cerebellar symptoms.  Psych: Awake and oriented X 3, normal affect, Insight and Judgment appropriate.    Dan MakerAshley C Tomoki Lucken, NP 11:09 AM [facility]

## 2017-09-15 ENCOUNTER — Encounter: Payer: Self-pay | Admitting: Adult Health

## 2017-09-15 ENCOUNTER — Ambulatory Visit: Payer: BLUE CROSS/BLUE SHIELD | Admitting: Adult Health

## 2017-09-15 VITALS — BP 138/88 | HR 57 | Temp 97.7°F | Ht 65.5 in | Wt 255.0 lb

## 2017-09-15 DIAGNOSIS — J0100 Acute maxillary sinusitis, unspecified: Secondary | ICD-10-CM

## 2017-09-15 DIAGNOSIS — K219 Gastro-esophageal reflux disease without esophagitis: Secondary | ICD-10-CM

## 2017-09-15 DIAGNOSIS — E785 Hyperlipidemia, unspecified: Secondary | ICD-10-CM

## 2017-09-15 DIAGNOSIS — I10 Essential (primary) hypertension: Secondary | ICD-10-CM

## 2017-09-15 DIAGNOSIS — E559 Vitamin D deficiency, unspecified: Secondary | ICD-10-CM | POA: Diagnosis not present

## 2017-09-15 DIAGNOSIS — E1169 Type 2 diabetes mellitus with other specified complication: Secondary | ICD-10-CM | POA: Diagnosis not present

## 2017-09-15 DIAGNOSIS — Z79899 Other long term (current) drug therapy: Secondary | ICD-10-CM

## 2017-09-15 MED ORDER — METAXALONE 800 MG PO TABS: 800.0000 mg | ORAL_TABLET | Freq: Three times a day (TID) | ORAL | 1 refills | Status: DC

## 2017-09-15 MED ORDER — EZETIMIBE 10 MG PO TABS: 10.0000 mg | ORAL_TABLET | Freq: Every day | ORAL | 2 refills | Status: DC

## 2017-09-15 MED ORDER — FLUTICASONE PROPIONATE 50 MCG/ACT NA SUSP: 2.0000 | Freq: Every day | NASAL | 1 refills | Status: DC

## 2017-09-15 NOTE — Patient Instructions (Addendum)
Try Dave[employer] killer bread brand -   Please restart citrucel/benefiber daily for cholesterol, oatmeal for breakfast    Prediabetes Eating Plan Prediabetes-also called impaired glucose tolerance or impaired fasting glucose-is a condition that causes blood sugar (blood glucose) levels to be higher than normal. Following a healthy diet can help to keep prediabetes under control. It can also help to lower the risk of type 2 diabetes and heart disease, which are increased in people who have prediabetes. Along with regular exercise, a healthy diet:  Promotes weight loss.  Helps to control blood sugar levels.  Helps to improve the way that the body uses insulin.  What do I need to know about this eating plan?  Use the glycemic index (GI) to plan your meals. The index tells you how quickly a food will raise your blood sugar. Choose low-GI foods. These foods take a longer time to raise blood sugar.  Pay close attention to the amount of carbohydrates in the food that you eat. Carbohydrates increase blood sugar levels.  Keep track of how many calories you take in. Eating the right amount of calories will help you to achieve a healthy weight. Losing about 7 percent of your starting weight can help to prevent type 2 diabetes.  You may want to follow a Mediterranean diet. This diet includes a lot of vegetables, lean meats or fish, whole grains, fruits, and healthy oils and fats. What foods can I eat? Grains Whole grains, such as whole-wheat or whole-grain breads, crackers, cereals, and pasta. Unsweetened oatmeal. Bulgur. Barley. Quinoa. Brown rice. Corn or whole-wheat flour tortillas or taco shells. Vegetables Lettuce. Spinach. Peas. Beets. Cauliflower. Cabbage. Broccoli. Carrots. Tomatoes. Squash. Eggplant. Herbs. Peppers. Onions. Cucumbers. Brussels sprouts. Fruits Berries. Bananas. Apples. Oranges. Grapes. Papaya. Mango. Pomegranate. Kiwi. Grapefruit. Cherries. Meats and Other Protein  Sources Seafood. Lean meats, such as chicken and [place] or lean cuts of pork and beef. Tofu. Eggs. Nuts. Beans. Dairy Low-fat or fat-free dairy products, such as yogurt, cottage cheese, and cheese. Beverages Water. Tea. Coffee. Sugar-free or diet soda. Seltzer water. Milk. Milk alternatives, such as soy or almond milk. Condiments Mustard. Relish. Low-fat, low-sugar ketchup. Low-fat, low-sugar barbecue sauce. Low-fat or fat-free mayonnaise. Sweets and Desserts Sugar-free or low-fat pudding. Sugar-free or low-fat ice cream and other frozen treats. Fats and Oils Avocado. Walnuts. Olive oil. The items listed above may not be a complete list of recommended foods or beverages. Contact your dietitian for more options. What foods are not recommended? Grains Refined white flour and flour products, such as bread, pasta, snack foods, and cereals. Beverages Sweetened drinks, such as sweet iced tea and soda. Sweets and Desserts Baked goods, such as cake, cupcakes, pastries, cookies, and cheesecake. The items listed above may not be a complete list of foods and beverages to avoid. Contact your dietitian for more information. This information is not intended to replace advice given to you by your health care provider. Make sure you discuss any questions you have with your health care provider. Document Released: 06/24/2014 Document Revised: 07/16/2015 Document Reviewed: 03/05/2014 Elsevier Interactive Patient Education  2017 Elsevier Inc.     Preventing High Cholesterol Cholesterol is a waxy, fat-like substance that your body needs in small amounts. Your liver makes all the cholesterol that your body needs. Having high cholesterol (hypercholesterolemia) increases your risk for heart disease and stroke. Extra (excess) cholesterol comes from the food you eat, such as animal-based fat (saturated fat) from meat and some dairy products. High cholesterol can often be  prevented with diet and lifestyle  changes. If you already have high cholesterol, you can control it with diet and lifestyle changes, as well as medicine. What nutrition changes can be made?  Eat less saturated fat. Foods that contain saturated fat include red meat and some dairy products.  Avoid processed meats, like bacon and lunch meats.  Avoid trans fats, which are found in margarine and some baked goods.  Avoid foods and beverages that have added sugars.  Eat more fruits, vegetables, and whole grains.  Choose healthy sources of protein, such as fish, poultry, and nuts.  Choose healthy sources of fat, such as: ? Nuts. ? Vegetable oils, especially olive oil. ? Fish that have healthy fats (omega-3 fatty acids), such as mackerel or salmon. What lifestyle changes can be made?  Lose weight if you are overweight. Losing 5-10 lb (2.3-4.5 kg) can help prevent or control high cholesterol and reduce your risk for diabetes and high blood pressure. Ask your health care provider to help you with a diet and exercise plan to safely lose weight.  Get enough exercise. Do at least 150 minutes of moderate-intensity exercise each week. ? You could do this in short exercise sessions several times a day, or you could do longer exercise sessions a few times a week. For example, you could take a brisk 10-minute walk or bike ride, 3 times a day, for 5 days a week.  Do not smoke. If you need help quitting, ask your health care provider.  Limit your alcohol intake. If you drink alcohol, limit alcohol intake to no more than 1 drink a day for nonpregnant women and 2 drinks a day for men. One drink equals 12 oz of beer, 5 oz of wine, or 1 oz of hard liquor. Why are these changes important? If you have high cholesterol, deposits (plaques) may build up on the walls of your blood vessels. Plaques make the arteries narrower and stiffer, which can restrict or block blood flow and cause blood clots to form. This greatly increases your risk for heart  attack and stroke. Making diet and lifestyle changes can reduce your risk for these life-threatening conditions. What can I do to lower my risk?  Manage your risk factors for high cholesterol. Talk with your health care provider about all of your risk factors and how to lower your risk.  Manage other conditions that you have, such as diabetes or high blood pressure (hypertension).  Have your cholesterol checked at regular intervals.  Keep all follow-up visits as told by your health care provider. This is important. How is this treated? In addition to diet and lifestyle changes, your health care provider may recommend medicines to help lower cholesterol, such as a medicine to reduce the amount of cholesterol made in your liver. You may need medicine if:  Diet and lifestyle changes do not lower your cholesterol enough.  You have high cholesterol and other risk factors for heart disease or stroke.  Take over-the-counter and prescription medicines only as told by your health care provider. Where to find more information:  American Heart Association: 1122334455.jsp  National Heart, Lung, and Blood Institute: http://hood.com/ Summary  High cholesterol increases your risk for heart disease and stroke. By keeping your cholesterol level low, you can reduce your risk for these conditions.  Diet and lifestyle changes are the most important steps in preventing high cholesterol.  Work with your health care provider to manage your risk factors, and have your blood tested regularly. This information  is not intended to replace advice given to you by your health care provider. Make sure you discuss any questions you have with your health care provider. Document Released: 02/22/2015 Document Revised: 10/17/2015 Document Reviewed: 10/17/2015 Elsevier Interactive Patient Education   2018 [employer].    Ezetimibe Tablets What is this medicine? EZETIMIBE (ez ET i mibe) blocks the absorption of cholesterol from the stomach. It can help lower blood cholesterol for patients who are at risk of getting heart disease or a stroke. It is only for patients whose cholesterol level is not controlled by diet. This medicine may be used for other purposes; ask your health care provider or pharmacist if you have questions. COMMON BRAND NAME(S): Zetia What should I tell my health care provider before I take this medicine? They need to know if you have any of these conditions: -liver disease -an unusual or allergic reaction to ezetimibe, medicines, foods, dyes, or preservatives -pregnant or trying to get pregnant -breast-feeding How should I use this medicine? Take this medicine by mouth with a glass of water. Follow the directions on the prescription label. This medicine can be taken with or without food. Take your doses at regular intervals. Do not take your medicine more often than directed. Talk to your pediatrician regarding the use of this medicine in children. Special care may be needed. Overdosage: If you think you have taken too much of this medicine contact a poison control center or emergency room at once. NOTE: This medicine is only for you. Do not share this medicine with others. What if I miss a dose? If you miss a dose, take it as soon as you can. If it is almost time for your next dose, take only that dose. Do not take double or extra doses. What may interact with this medicine? Do not take this medicine with any of the following medications: -fenofibrate -gemfibrozil This medicine may also interact with the following medications: -antacids -cyclosporine -herbal medicines like red yeast rice -other medicines to lower cholesterol or triglycerides This list may not describe all possible interactions. Give your health care provider a list of all the medicines,  herbs, non-prescription drugs, or dietary supplements you use. Also tell them if you smoke, drink alcohol, or use illegal drugs. Some items may interact with your medicine. What should I watch for while using this medicine? Visit your doctor or health care professional for regular checks on your progress. You will need to have your cholesterol levels checked. If you are also taking some other cholesterol medicines, you will also need to have tests to make sure your liver is working properly. Tell your doctor or health care professional if you get any unexplained muscle pain, tenderness, or weakness, especially if you also have a fever and tiredness. You need to follow a low-cholesterol, low-fat diet while you are taking this medicine. This will decrease your risk of getting heart and blood vessel disease. Exercising and avoiding alcohol and smoking can also help. Ask your doctor or dietician for advice. What side effects may I notice from receiving this medicine? Side effects that you should report to your doctor or health care professional as soon as possible: -allergic reactions like skin rash, itching or hives, swelling of the face, lips, or tongue -dark yellow or brown urine -unusually weak or tired -yellowing of the skin or eyes Side effects that usually do not require medical attention (report to your doctor or health care professional if they continue or are bothersome): -diarrhea -dizziness -  headache -stomach upset or pain This list may not describe all possible side effects. Call your doctor for medical advice about side effects. You may report side effects to FDA at 1-800-FDA-1088. Where should I keep my medicine? Keep out of the reach of children. Store at room temperature between 15 and 30 degrees C (59 and 86 degrees F). Protect from moisture. Keep container tightly closed. Throw away any unused medicine after the expiration date. NOTE: This sheet is a summary. It may not cover all  possible information. If you have questions about this medicine, talk to your doctor, pharmacist, or health care provider.  2018 Elsevier/Gold Standard (2011-08-15 15:39:09)

## 2017-09-16 LAB — COMPLETE METABOLIC PANEL WITH GFR
AG Ratio: 1.6 (calc) (ref 1.0–2.5)
ALT: 23 U/L (ref 9–46)
AST: 19 U/L (ref 10–35)
Albumin: 4.6 g/dL (ref 3.6–5.1)
Alkaline phosphatase (APISO): 102 U/L (ref 40–115)
BUN: 12 mg/dL (ref 7–25)
CO2: 27 mmol/L (ref 20–32)
Calcium: 9.6 mg/dL (ref 8.6–10.3)
Chloride: 103 mmol/L (ref 98–110)
Creat: 1.02 mg/dL (ref 0.70–1.25)
GFR, Est African American: 92 mL/min/{1.73_m2} (ref >=60)
GFR, Est Non African American: 80 mL/min/{1.73_m2} (ref >=60)
Globulin: 2.8 g/dL (calc) (ref 1.9–3.7)
Glucose, Bld: 121 mg/dL — ABNORMAL HIGH (ref 65–99)
Potassium: 4.3 mmol/L (ref 3.5–5.3)
Sodium: 139 mmol/L (ref 135–146)
Total Bilirubin: 0.6 mg/dL (ref 0.2–1.2)
Total Protein: 7.4 g/dL (ref 6.1–8.1)

## 2017-09-16 LAB — CBC WITH DIFFERENTIAL/PLATELET
Basophils Absolute: 20 cells/uL (ref 0–200)
Basophils Relative: 0.3 %
Eosinophils Absolute: 127 cells/uL (ref 15–500)
Eosinophils Relative: 1.9 %
HCT: 43.6 % (ref 38.5–50.0)
Hemoglobin: 15 g/dL (ref 13.2–17.1)
Lymphs Abs: 1695 cells/uL (ref 850–3900)
MCH: 28.6 pg (ref 27.0–33.0)
MCHC: 34.4 g/dL (ref 32.0–36.0)
MCV: 83 fL (ref 80.0–100.0)
MPV: 10.9 fL (ref 7.5–12.5)
Monocytes Relative: 6.7 %
Neutro Abs: 4409 cells/uL (ref 1500–7800)
Neutrophils Relative %: 65.8 %
Platelets: 307 10*3/uL (ref 140–400)
RBC: 5.25 10*6/uL (ref 4.20–5.80)
RDW: 13.1 % (ref 11.0–15.0)
Total Lymphocyte: 25.3 %
WBC mixed population: 449 cells/uL (ref 200–950)
WBC: 6.7 10*3/uL (ref 3.8–10.8)

## 2017-09-16 LAB — LIPID PANEL
Cholesterol: 248 mg/dL — ABNORMAL HIGH (ref <200)
HDL: 45 mg/dL (ref >40)
LDL Cholesterol (Calc): 174 mg/dL (calc) — ABNORMAL HIGH
Non-HDL Cholesterol (Calc): 203 mg/dL (calc) — ABNORMAL HIGH (ref <130)
Total CHOL/HDL Ratio: 5.5 (calc) — ABNORMAL HIGH (ref <5.0)
Triglycerides: 150 mg/dL — ABNORMAL HIGH (ref <150)

## 2017-09-16 LAB — MAGNESIUM: Magnesium: 1.9 mg/dL (ref 1.5–2.5)

## 2017-09-16 LAB — TSH: TSH: 1.52 mIU/L (ref 0.40–4.50)

## 2017-09-16 LAB — HEMOGLOBIN A1C
Hgb A1c MFr Bld: 6.3 % of total Hgb — ABNORMAL HIGH (ref <5.7)
Mean Plasma Glucose: 134 (calc)
eAG (mmol/L): 7.4 (calc)

## 2017-10-07 ENCOUNTER — Other Ambulatory Visit: Payer: Self-pay | Admitting: Internal Medicine

## 2017-10-11 ENCOUNTER — Other Ambulatory Visit: Payer: Self-pay | Admitting: Internal Medicine

## 2017-11-04 IMAGING — RF DG HIP (WITH PELVIS) OPERATIVE*R*
1 series · 3 of 3 positions shown · non-contrast
Comparison: 06/05/2015

CLINICAL DATA: Right hip arthroplasty.

EXAM:
OPERATIVE RIGHT HIP (WITH PELVIS IF PERFORMED) 1 VIEWS
TECHNIQUE: Fluoroscopic spot image(s) were submitted for interpretation
post-operatively.

[Series 1: run · 3 of 3 slices shown]
[im 1/3]
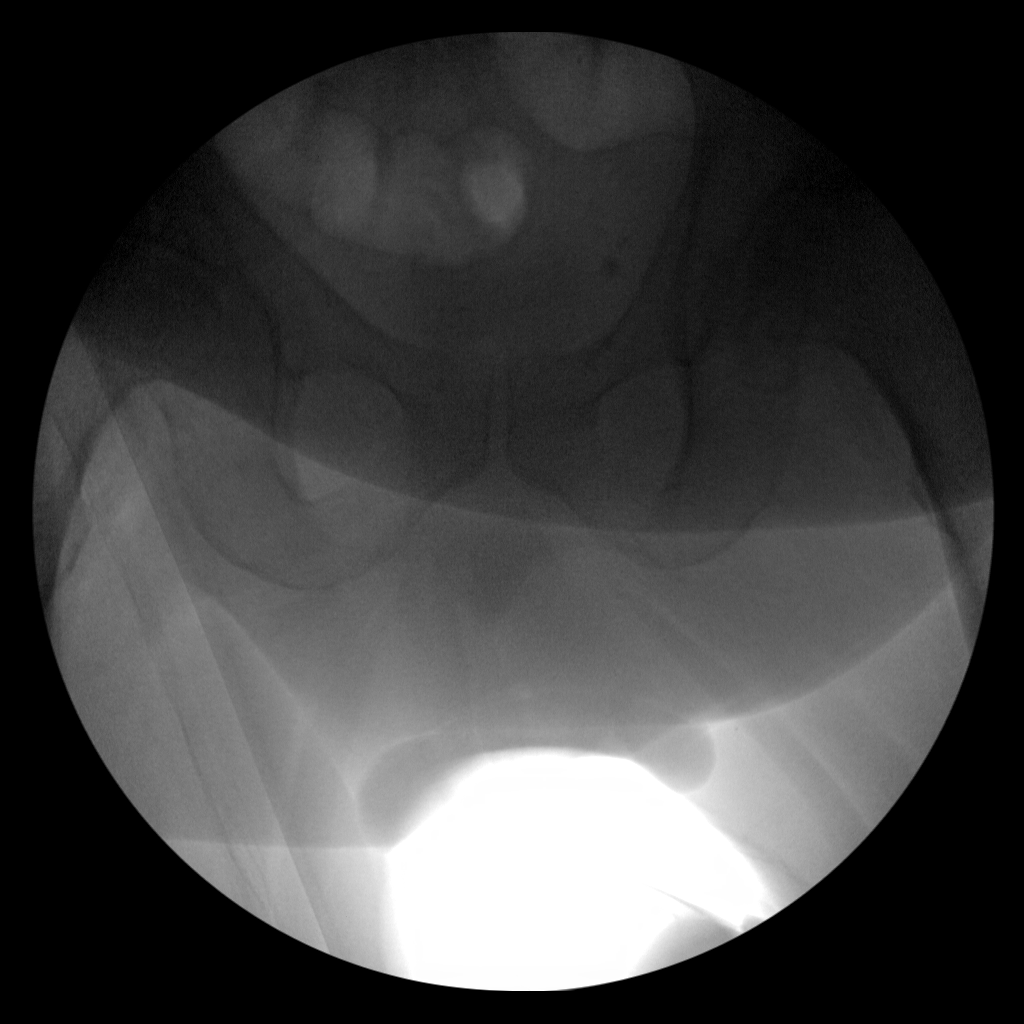
[im 2/3]
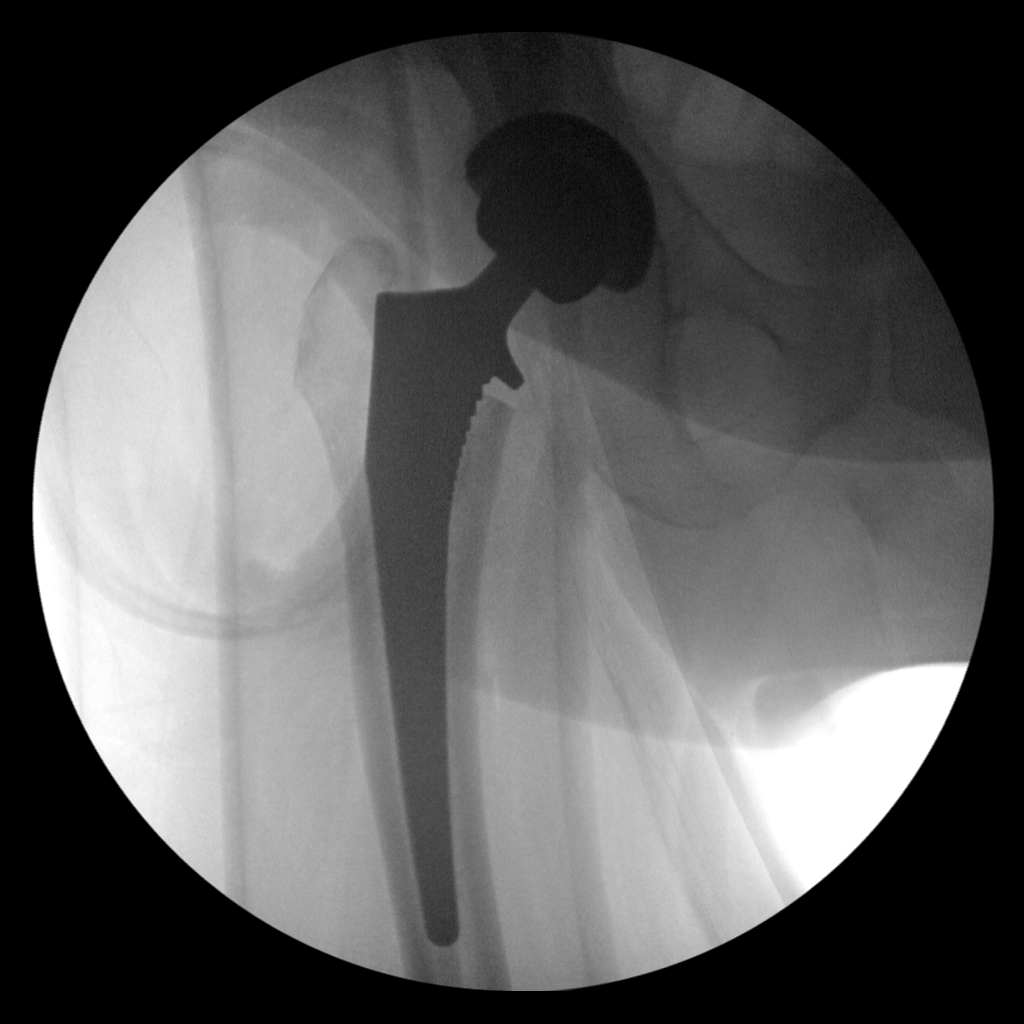
[im 3/3]
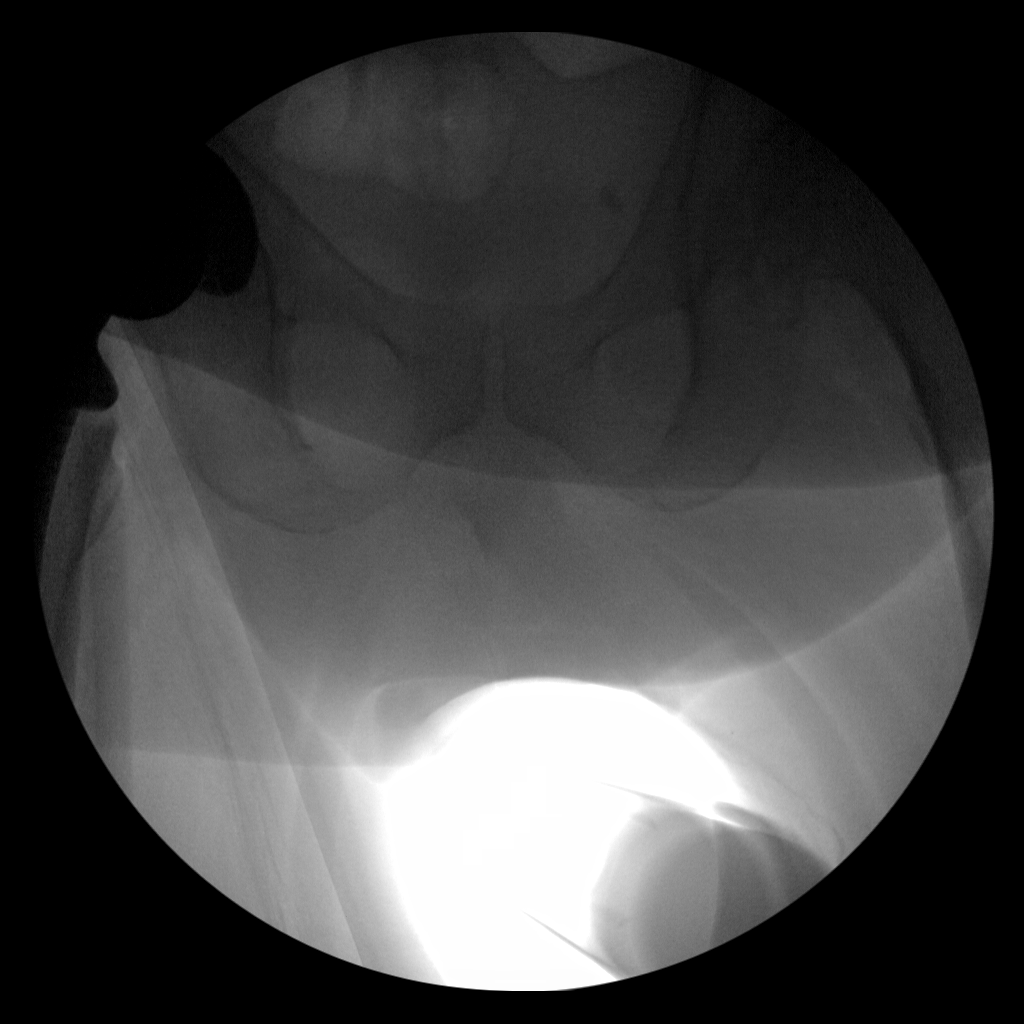

[3 of 3 positions shown; findings below may reference images not displayed]

FINDINGS: AP fluoroscopy of the right hip shows total joint replacement. No
dislocation or periprosthetic fracture.
IMPRESSION: Fluoroscopy for total right hip arthroplasty.  No adverse finding.

## 2017-11-06 ENCOUNTER — Other Ambulatory Visit: Payer: Self-pay | Admitting: Internal Medicine

## 2017-12-21 ENCOUNTER — Ambulatory Visit: Payer: Self-pay | Admitting: Adult Health

## 2017-12-29 ENCOUNTER — Other Ambulatory Visit: Payer: Self-pay | Admitting: Internal Medicine

## 2018-01-10 ENCOUNTER — Emergency Department (HOSPITAL_COMMUNITY): Payer: BLUE CROSS/BLUE SHIELD

## 2018-01-10 ENCOUNTER — Emergency Department (HOSPITAL_COMMUNITY)
Admission: EM | Admit: 2018-01-10 | Discharge: 2018-01-10 | Disposition: A | Discharge status: Home / Self Care | Payer: BLUE CROSS/BLUE SHIELD | Attending: Emergency Medicine | Admitting: Emergency Medicine

## 2018-01-10 ENCOUNTER — Ambulatory Visit: Payer: Self-pay | Admitting: Adult Health

## 2018-01-10 ENCOUNTER — Encounter (HOSPITAL_COMMUNITY): Payer: Self-pay

## 2018-01-10 ENCOUNTER — Other Ambulatory Visit: Payer: Self-pay

## 2018-01-10 DIAGNOSIS — Z87891 Personal history of nicotine dependence: Secondary | ICD-10-CM | POA: Insufficient documentation

## 2018-01-10 DIAGNOSIS — I10 Essential (primary) hypertension: Secondary | ICD-10-CM | POA: Diagnosis not present

## 2018-01-10 DIAGNOSIS — R42 Dizziness and giddiness: Secondary | ICD-10-CM | POA: Diagnosis present

## 2018-01-10 DIAGNOSIS — E119 Type 2 diabetes mellitus without complications: Secondary | ICD-10-CM | POA: Diagnosis not present

## 2018-01-10 DIAGNOSIS — Z79899 Other long term (current) drug therapy: Secondary | ICD-10-CM | POA: Diagnosis not present

## 2018-01-10 LAB — CBC
HCT: 46.6 % (ref 39.0–52.0)
Hemoglobin: 14.9 g/dL (ref 13.0–17.0)
MCH: 27.8 pg (ref 26.0–34.0)
MCHC: 32 g/dL (ref 30.0–36.0)
MCV: 86.9 fL (ref 80.0–100.0)
Platelets: 303 10*3/uL (ref 150–400)
RBC: 5.36 MIL/uL (ref 4.22–5.81)
RDW: 12.5 % (ref 11.5–15.5)
WBC: 8.1 10*3/uL (ref 4.0–10.5)
nRBC: 0 % (ref 0.0–0.2)

## 2018-01-10 LAB — BASIC METABOLIC PANEL
Anion gap: 6 (ref 5–15)
BUN: 14 mg/dL (ref 6–20)
CO2: 26 mmol/L (ref 22–32)
Calcium: 9.4 mg/dL (ref 8.9–10.3)
Chloride: 105 mmol/L (ref 98–111)
Creatinine, Ser: 0.95 mg/dL (ref 0.61–1.24)
GFR calc Af Amer: >60 mL/min (ref >60)
GFR calc non Af Amer: >60 mL/min (ref >60)
Glucose, Bld: 163 mg/dL — ABNORMAL HIGH (ref 70–99)
Potassium: 4.4 mmol/L (ref 3.5–5.1)
Sodium: 137 mmol/L (ref 135–145)

## 2018-01-10 MED ORDER — IOPAMIDOL (ISOVUE-370) INJECTION 76%
75.0000 mL | Freq: Once | INTRAVENOUS | Status: AC | PRN
  Administered 2018-01-10: 75 mL via INTRAVENOUS

## 2018-01-10 MED ORDER — MECLIZINE HCL 25 MG PO TABS
25.0000 mg | ORAL_TABLET | Freq: Once | ORAL | Status: AC
  Administered 2018-01-10: 25 mg via ORAL
  Filled 2018-01-10: qty 1

## 2018-01-10 MED ORDER — ONDANSETRON HCL 4 MG/2ML IJ SOLN
4.0000 mg | Freq: Once | INTRAMUSCULAR | Status: AC
  Administered 2018-01-10: 4 mg via INTRAVENOUS
  Filled 2018-01-10: qty 2

## 2018-01-10 MED ORDER — MECLIZINE HCL 25 MG PO TABS: 25.0000 mg | ORAL_TABLET | Freq: Four times a day (QID) | ORAL | 0 refills | Status: DC | PRN

## 2018-01-10 MED ORDER — IOPAMIDOL (ISOVUE-370) INJECTION 76%
INTRAVENOUS | Status: AC
  Filled 2018-01-10: qty 100

## 2018-01-10 NOTE — ED Provider Notes (Signed)
Patient developed acute onset of dizziness after spinal manipulation that was performed by his orthopedist today.  Symptoms seems brought on by positional changes mildly improved with initial meclizine dose.  Given his complaints, and lack of resolution of his symptoms, will consider CT angiograms of head and cervical spine to rule out potential stroke causing dizziness.  He has normal renal function.  Initial head CT scan without any acute intracranial abnormalities.   Care discussed with DR. Campos.   5:51 PM Patient made aware of additional imaging evaluation for this condition.  At this time is resting comfortably.  On exam, he is alert and oriented x3, he has several fatigable nystagmus favoring the right side.  He has no focal neuro deficit, his coordination is intact, no carotid bruit, no significant discomfort upon palpation of the neck.  6:36 PM Fortunately head and neck CT angiogram demonstrate no evidence of vascular pathology or any acute intracranial finding. This is likely a peripheral vertigo.  Pt discharge home with meclizine.  Return precaution given.    BP 133/85 (BP Location: Right Arm)   Pulse 72   Temp 98 F (36.7 C) (Oral)   Resp 14   Ht 5\' 6"  (1.676 m)   Wt 112.9 kg   SpO2 93%   BMI 40.19 kg/m   Results for orders placed or performed during the hospital encounter of 01/10/18  Basic metabolic panel  Result Value Ref Range   Sodium 137 135 - 145 mmol/L   Potassium 4.4 3.5 - 5.1 mmol/L   Chloride 105 98 - 111 mmol/L   CO2 26 22 - 32 mmol/L   Glucose, Bld 163 (H) 70 - 99 mg/dL   BUN 14 6 - 20 mg/dL   Creatinine, Ser 1.610.95 0.61 - 1.24 mg/dL   Calcium 9.4 8.9 - 09.610.3 mg/dL   GFR calc non Af Amer >60 >60 mL/min   GFR calc Af Amer >60 >60 mL/min   Anion gap 6 5 - 15  CBC  Result Value Ref Range   WBC 8.1 4.0 - 10.5 K/uL   RBC 5.36 4.22 - 5.81 MIL/uL   Hemoglobin 14.9 13.0 - 17.0 g/dL   HCT 04.546.6 40.939.0 - 81.152.0 %   MCV 86.9 80.0 - 100.0 fL   MCH 27.8 26.0 - 34.0 pg    MCHC 32.0 30.0 - 36.0 g/dL   RDW 91.412.5 78.211.5 - 95.615.5 %   Platelets 303 150 - 400 K/uL   nRBC 0.0 0.0 - 0.2 %   Ct Angio Head W Or Wo Contrast  Result Date: 01/10/2018 CLINICAL DATA:  Acute onset vertigo at physical therapy this morning. History of hypertension, hyperlipidemia. EXAM: CT ANGIOGRAPHY HEAD AND NECK TECHNIQUE: Multidetector CT imaging of the head and neck was performed using the standard protocol during bolus administration of intravenous contrast. Multiplanar CT image reconstructions and MIPs were obtained to evaluate the vascular anatomy. Carotid stenosis measurements (when applicable) are obtained utilizing NASCET criteria, using the distal internal carotid diameter as the denominator. CONTRAST:  75mL ISOVUE-370 IOPAMIDOL (ISOVUE-370) INJECTION 76% COMPARISON:  CT HEAD January 10, 2018 FINDINGS: CTA NECK FINDINGS: AORTIC ARCH: Normal appearance of the thoracic arch, normal branch pattern. The origins of the innominate, left Common carotid artery and subclavian artery are widely patent. RIGHT CAROTID SYSTEM: Common carotid artery is patent. Mild calcific atherosclerosis of the carotid bifurcation without hemodynamically significant stenosis by NASCET criteria. Normal appearance of the internal carotid artery. LEFT CAROTID SYSTEM: Common carotid artery is patent. Mild calcific atherosclerosis  of the carotid bifurcation without hemodynamically significant stenosis by NASCET criteria. Normal appearance of the internal carotid artery. VERTEBRAL ARTERIES:Left vertebral artery arises directly from the aortic arch with mild calcific atherosclerosis, mild stenosis. RIGHT vertebral artery is dominant. Patent vertebral arteries with mild extrinsic compression due to degenerative cervical spine. SKELETON: No acute osseous process though bone windows have not been submitted. C3-4 C4-5 spondylosis, moderate LEFT C4-5 neural foraminal narrowing. OTHER NECK: Soft tissues of the neck are nonacute though, not  tailored for evaluation. UPPER CHEST: Included lung apices are clear. No superior mediastinal lymphadenopathy. CTA HEAD FINDINGS: ANTERIOR CIRCULATION: Patent cervical internal carotid arteries, petrous, cavernous and supra clinoid internal carotid arteries. Patent anterior communicating artery. Patent anterior and middle cerebral arteries. No large vessel occlusion, significant stenosis, contrast extravasation or aneurysm. POSTERIOR CIRCULATION: Patent vertebral arteries, vertebrobasilar junction and basilar artery, as well as main branch vessels. Patent posterior cerebral arteries. Robust bilateral posterior communicating arteries present, predominately fetal origin on the LEFT. No large vessel occlusion, significant stenosis, contrast extravasation or aneurysm. VENOUS SINUSES: Major dural venous sinuses are patent though not tailored for evaluation on this angiographic examination. ANATOMIC VARIANTS: Azygos anterior cerebral artery. DELAYED PHASE: No abnormal intracranial enhancement. MIP images reviewed. IMPRESSION: CTA NECK: 1. No acute vascular process. 2. No hemodynamically significant stenosis ICA's. Patent vertebral arteries. CTA HEAD: 1. No emergent large vessel occlusion or flow-limiting stenosis. Electronically Signed   By: Awilda Metro M.D.   On: 01/10/2018 18:30   Ct Head Wo Contrast  Result Date: 01/10/2018 CLINICAL DATA:  Dizziness occurring today. EXAM: CT HEAD WITHOUT CONTRAST TECHNIQUE: Contiguous axial images were obtained from the base of the skull through the vertex without intravenous contrast. COMPARISON:  None. FINDINGS: Brain: No evidence for acute infarction, hemorrhage, mass lesion, hydrocephalus, or extra-axial fluid. Normal for age cerebral volume. No white matter disease. There is a eccentric asymmetric curvilinear calcification just lateral to the anterior clinoid on the LEFT, subcentimeter size, which could represent a small meningioma, versus asymmetric hyperostosis. See  series 4, image 19. No cavernous sinus mass. Vascular: No hyperdense vessel or unexpected calcification. Skull: Intact Sinuses/Orbits: No acute finding. Other: None IMPRESSION: No acute intracranial findings. No specific abnormality is seen which might contribute to dizziness. No focal intra-axial intracranial abnormality. Subtle asymmetric calcification LEFT anterior clinoid could represent a subcentimeter meningioma versus asymmetric hyperostosis. No follow-up is recommended at this time. Electronically Signed   By: Elsie Stain M.D.   On: 01/10/2018 14:38   Ct Angio Neck W And/or Wo Contrast  Result Date: 01/10/2018 CLINICAL DATA:  Acute onset vertigo at physical therapy this morning. History of hypertension, hyperlipidemia. EXAM: CT ANGIOGRAPHY HEAD AND NECK TECHNIQUE: Multidetector CT imaging of the head and neck was performed using the standard protocol during bolus administration of intravenous contrast. Multiplanar CT image reconstructions and MIPs were obtained to evaluate the vascular anatomy. Carotid stenosis measurements (when applicable) are obtained utilizing NASCET criteria, using the distal internal carotid diameter as the denominator. CONTRAST:  75mL ISOVUE-370 IOPAMIDOL (ISOVUE-370) INJECTION 76% COMPARISON:  CT HEAD January 10, 2018 FINDINGS: CTA NECK FINDINGS: AORTIC ARCH: Normal appearance of the thoracic arch, normal branch pattern. The origins of the innominate, left Common carotid artery and subclavian artery are widely patent. RIGHT CAROTID SYSTEM: Common carotid artery is patent. Mild calcific atherosclerosis of the carotid bifurcation without hemodynamically significant stenosis by NASCET criteria. Normal appearance of the internal carotid artery. LEFT CAROTID SYSTEM: Common carotid artery is patent. Mild calcific atherosclerosis of the carotid bifurcation  without hemodynamically significant stenosis by NASCET criteria. Normal appearance of the internal carotid artery. VERTEBRAL  ARTERIES:Left vertebral artery arises directly from the aortic arch with mild calcific atherosclerosis, mild stenosis. RIGHT vertebral artery is dominant. Patent vertebral arteries with mild extrinsic compression due to degenerative cervical spine. SKELETON: No acute osseous process though bone windows have not been submitted. C3-4 C4-5 spondylosis, moderate LEFT C4-5 neural foraminal narrowing. OTHER NECK: Soft tissues of the neck are nonacute though, not tailored for evaluation. UPPER CHEST: Included lung apices are clear. No superior mediastinal lymphadenopathy. CTA HEAD FINDINGS: ANTERIOR CIRCULATION: Patent cervical internal carotid arteries, petrous, cavernous and supra clinoid internal carotid arteries. Patent anterior communicating artery. Patent anterior and middle cerebral arteries. No large vessel occlusion, significant stenosis, contrast extravasation or aneurysm. POSTERIOR CIRCULATION: Patent vertebral arteries, vertebrobasilar junction and basilar artery, as well as main branch vessels. Patent posterior cerebral arteries. Robust bilateral posterior communicating arteries present, predominately fetal origin on the LEFT. No large vessel occlusion, significant stenosis, contrast extravasation or aneurysm. VENOUS SINUSES: Major dural venous sinuses are patent though not tailored for evaluation on this angiographic examination. ANATOMIC VARIANTS: Azygos anterior cerebral artery. DELAYED PHASE: No abnormal intracranial enhancement. MIP images reviewed. IMPRESSION: CTA NECK: 1. No acute vascular process. 2. No hemodynamically significant stenosis ICA's. Patent vertebral arteries. CTA HEAD: 1. No emergent large vessel occlusion or flow-limiting stenosis. Electronically Signed   By: Awilda Metro M.D.   On: 01/10/2018 18:30      Fayrene Helper, PA-C 01/10/18 Smitty Knudsen, MD 01/11/18 Jacinta Shoe

## 2018-01-10 NOTE — Progress Notes (Deleted)
Assessment and Plan:  There are no diagnoses linked to this encounter.    Further disposition pending results of labs. Discussed med's effects and SE's.   Over 30 minutes of exam, counseling, chart review, and critical decision making was performed.   Future Appointments  Date Time Provider Department Center  01/10/2018  2:00 PM Judd Gaudierorbett, Sayvon Arterberry, NP [facility] None  02/02/2018 11:00 AM Judd Gaudierorbett, Takyia Sindt, NP [facility] None  04/16/2018 10:00 AM Quentin Mullingollier, Amanda, PA-C [facility] None    ------------------------------------------------------------------------------------------------------------------   HPI There were no vitals taken for this visit.  62 y.o.male with hx of htn, hyperlipidemia, T2DM, aortic atherosclerosis presents for evaluation of dizziness ***  No head imaging available for review   Past Medical History:  Diagnosis Date  . DDD lumbar   . DJD (degenerative joint disease)   . GERD (gastroesophageal reflux disease)    takes Omeprazole daily  . Hyperlipidemia   . Hypertension    takes Lisinopril and Amlodipine daily  . Multiple allergies    takes Claritin and Singulair daily  . PONV (postoperative nausea and vomiting)      Allergies  Allergen Reactions  . Statins Other (See Comments)    Flu-like symptoms  . Tylenol [Acetaminophen]     Liver enzymes elevated so patient has stopped taking anything with acetaminophen in it  . Phentermine Other (See Comments)    constipation    Current Outpatient Medications on File Prior to Visit  Medication Sig  . amLODipine (NORVASC) 10 MG tablet Take 1 tablet (10 mg total) by mouth daily.  Marland Kitchen. amLODipine (NORVASC) 10 MG tablet TAKE ONE TABLET BY MOUTH DAILY  . Cholecalciferol (VITAMIN D PO) Take 2,000 Units by mouth daily.   . cyanocobalamin 500 MCG tablet Take 500 mcg by mouth daily.  Marland Kitchen. ezetimibe (ZETIA) 10 MG tablet Take 1 tablet (10 mg total) by mouth daily.  . fluticasone (FLONASE) 50 MCG/ACT nasal spray Place 2 sprays  into both nostrils at bedtime.  Marland Kitchen. L-LYSINE PO Take 500 mg by mouth daily.   Marland Kitchen. lisinopril (PRINIVIL,ZESTRIL) 20 MG tablet Take 1 tablet (20 mg total) by mouth daily.  Marland Kitchen. lisinopril (PRINIVIL,ZESTRIL) 20 MG tablet TAKE ONE TABLET BY MOUTH DAILY  . loratadine (CLARITIN) 10 MG tablet TAKE ONE TABLET BY MOUTH DAILY  . metaxalone (SKELAXIN) 800 MG tablet Take 1 tablet (800 mg total) by mouth 3 (three) times daily.  . montelukast (SINGULAIR) 10 MG tablet TAKE ONE TABLET BY MOUTH DAILY  . omeprazole (PRILOSEC) 40 MG capsule TAKE ONE CAPSULE BY MOUTH TWICE A DAY   No current facility-administered medications on file prior to visit.     ROS: all negative except above.   Physical Exam:  There were no vitals taken for this visit.  General Appearance: Well nourished, in no apparent distress. Eyes: PERRLA, EOMs, conjunctiva no swelling or erythema Sinuses: No Frontal/maxillary tenderness ENT/Mouth: Ext aud canals clear, TMs without erythema, bulging. No erythema, swelling, or exudate on post pharynx.  Tonsils not swollen or erythematous. Hearing normal.  Neck: Supple, thyroid normal.  Respiratory: Respiratory effort normal, BS equal bilaterally without rales, rhonchi, wheezing or stridor.  Cardio: RRR with no MRGs. Brisk peripheral pulses without edema.  Abdomen: Soft, + BS.  Non tender, no guarding, rebound, hernias, masses. Lymphatics: Non tender without lymphadenopathy.  Musculoskeletal: Full ROM, 5/5 strength, normal gait.  Skin: Warm, dry without rashes, lesions, ecchymosis.  Neuro: Cranial nerves intact. Normal muscle tone, no cerebellar symptoms. Sensation intact.  Psych: Awake and oriented X 3,  normal affect, Insight and Judgment appropriate.     Dan Maker, NP 12:22 PM [facility]

## 2018-01-10 NOTE — ED Triage Notes (Signed)
Per [facility], pt from orthopedic office where he was under going ortho therapy to left shoulder and neck when pt suddenly became dizzy around 1030, worse with sitting up or standing. EKG showed trigeminal pvc's. Pt axox4. No neuro deficits. Only dizzy now when moving.

## 2018-01-10 NOTE — Discharge Instructions (Signed)
Take Meclizine for dizziness every 6 hours as needed Please follow up with ENT if you are not improving Return to the ED if you are worsening

## 2018-01-10 NOTE — ED Notes (Signed)
Patient continues to report dizziness and nausea, reproducible after ambulating to bathroom and back to bed. Will hold off on d/c at this time and treat nausea.

## 2018-01-10 NOTE — ED Notes (Signed)
Patient verbalized understanding of discharge instructions and denies any further needs or questions at this time. VS stable. Patient ambulatory with steady gait. Assisted to ED entrance in wheelchair.   

## 2018-01-10 NOTE — ED Provider Notes (Signed)
[facility] Provider Note   CSN: [id number] Arrival date & time: 01/10/18  1346     History   Chief Complaint Chief Complaint  Patient presents with  . Dizziness    HPI John Sorensonaul T Frankowski is a 62 y.o. male who presents with dizziness. PMH significant for HLD, HTN, Type 2 DM, GERD, DDD of lumbar spine. The patient states that he was at his neurosurgeon's office doing PT today. The phsyical therapist was doing massage, tens units, and heat on his neck and left shoulder. He then had him lie down and he started to become dizzy like the room was spinning. He then was doing traction exercises and pulling on the patient's neck. When he sat up he became extremely dizzy. Sitting without turning his head would calm things down. Dr. Yisroel Rammingaldorf checked his BP and CBG which were normal. He advised him to go see his primary care doctor today but then he started dry heaving so he wanted him to come to the ED. His [facility] but there was no one to check him in so he went to his PCP's office and due to his severe dizziness and distress they called 911. If he keeps his head still it he is not dizzy but any movement makes him very dizzy and nauseous like he has motion sickness. He has had vertigo in the past which was treated with medication. His wife is at bedside and states that he's been having small "dizzy spells" over the past couple days but they would resolved quickly. No headache, neck pain, chest pain, SOB, abdominal pain, or syncope  HPI  Past Medical History:  Diagnosis Date  . DDD lumbar   . DJD (degenerative joint disease)   . GERD (gastroesophageal reflux disease)    takes Omeprazole daily  . Hyperlipidemia   . Hypertension    takes Lisinopril and Amlodipine daily  . Multiple allergies    takes Claritin and Singulair daily  . PONV (postoperative nausea and vomiting)     Patient Active Problem List   Diagnosis Date Noted  . GERD  (gastroesophageal reflux disease) 07/31/2017  . Aortic atherosclerosis (HCC) 04/07/2017  . Type 2 diabetes mellitus with hyperlipidemia (HCC) 05/08/2015  . Vitamin D deficiency 05/08/2015  . Medication management 05/08/2015  . History of nephrolithiasis 05/08/2015  . Chronic pain 05/08/2015  . DDD (degenerative disc disease), lumbar 05/08/2015  . Morbidly obese (HCC)   . Hyperlipidemia   . Hypertension     Past Surgical History:  Procedure Laterality Date  . ANKLE FRACTURE SURGERY  1995   right  . BACK SURGERY  2002   lumb lam-fusion  . BACK SURGERY  03-[place]  . CARDIAC CATHETERIZATION     2007  . CHONDROPLASTY Left 10/28/2014   Procedure: CHONDROPLASTY;  Surgeon: Marcene CorningPeter Dalldorf, MD;  Location: [facility];  Service: Orthopedics;  Laterality: Left;  . COLONOSCOPY    . ELBOW LIGAMENT RECONSTRUCTION Right 2015   [place]  . KNEE ARTHROSCOPY Left 04/24/2012   Procedure: ARTHROSCOPY KNEE, PARTIAL MEDIAL MENISECTOMY AND CHONDROPLASTY;  Surgeon: Velna OchsPeter G Dalldorf, MD;  Location: [facility];  Service: Orthopedics;  Laterality: Left;  . KNEE ARTHROSCOPY WITH MEDIAL MENISECTOMY Left 10/28/2014   Procedure: LEFT ARTHROSCOPY KNEE, PARTIAL MEDIAL MENISECTOMY, CHONDROPLASTY;  Surgeon: Marcene CorningPeter Dalldorf, MD;  Location:  SURGERY CENTER;  Service: Orthopedics;  Laterality: Left;  . TOTAL HIP ARTHROPLASTY Right 09/22/2015  Procedure: TOTAL HIP ARTHROPLASTY ANTERIOR APPROACH;  Surgeon: Marcene Corning, MD;  Location: [facility] OR;  Service: Orthopedics;  Laterality: Right;  . TOTAL HIP ARTHROPLASTY Left 01/05/2016   Procedure: LEFT TOTAL HIP ARTHROPLASTY ANTERIOR APPROACH;  Surgeon: Marcene Corning, MD;  Location: [facility] OR;  Service: Orthopedics;  Laterality: Left;        Home Medications    Prior to Admission medications   Medication Sig Start Date End Date Taking? Authorizing Provider  amLODipine (NORVASC) 10 MG tablet Take 1 tablet (10 mg total) by mouth  daily. 09/14/17   Judd Gaudier, NP  amLODipine (NORVASC) 10 MG tablet TAKE ONE TABLET BY MOUTH DAILY 12/29/17   Quentin Mulling, PA-C  Cholecalciferol (VITAMIN D PO) Take 2,000 Units by mouth daily.     [provider]  cyanocobalamin 500 MCG tablet Take 500 mcg by mouth daily.    [provider]  ezetimibe (ZETIA) 10 MG tablet Take 1 tablet (10 mg total) by mouth daily. 09/15/17   Judd Gaudier, NP  fluticasone (FLONASE) 50 MCG/ACT nasal spray Place 2 sprays into both nostrils at bedtime. 09/15/17   Judd Gaudier, NP  L-LYSINE PO Take 500 mg by mouth daily.     [provider]  lisinopril (PRINIVIL,ZESTRIL) 20 MG tablet Take 1 tablet (20 mg total) by mouth daily. 09/14/17   Judd Gaudier, NP  lisinopril (PRINIVIL,ZESTRIL) 20 MG tablet TAKE ONE TABLET BY MOUTH DAILY 11/06/17   Lucky Cowboy, MD  loratadine (CLARITIN) 10 MG tablet TAKE ONE TABLET BY MOUTH DAILY 10/19/16   Lucky Cowboy, MD  metaxalone (SKELAXIN) 800 MG tablet Take 1 tablet (800 mg total) by mouth 3 (three) times daily. 09/15/17   Judd Gaudier, NP  montelukast (SINGULAIR) 10 MG tablet TAKE ONE TABLET BY MOUTH DAILY 04/16/17   Lucky Cowboy, MD  omeprazole (PRILOSEC) 40 MG capsule TAKE ONE CAPSULE BY MOUTH TWICE A DAY 10/07/17   Lucky Cowboy, MD    Family History Family History  Problem Relation Age of Onset  . Hypertension Mother   . Dementia Mother   . Cancer Father   . Hypertension Father   . Heart disease Father   . Hyperlipidemia Father   . Diabetes Sister   . Cancer Maternal Grandmother        lung    Social History Social History   Tobacco Use  . Smoking status: Former Smoker    Last attempt to quit: 02/22/1975    Years since quitting: 42.9  . Smokeless tobacco: Never Used  Substance Use Topics  . Alcohol use: No  . Drug use: No     Allergies   Statins; Tylenol [acetaminophen]; and Phentermine   Review of Systems Review of Systems  Constitutional: Negative  for fever.  Respiratory: Negative for shortness of breath.   Cardiovascular: Negative for chest pain.  Gastrointestinal: Positive for nausea and vomiting. Negative for abdominal pain.  Musculoskeletal: Positive for neck pain (chronic).  Neurological: Positive for dizziness. Negative for syncope, weakness, numbness and headaches.  All other systems reviewed and are negative.    Physical Exam Updated Vital Signs BP (!) 142/72 (BP Location: Left Arm)   Pulse 65   Temp 98 F (36.7 C) (Oral)   Resp 12   Ht 5\' 6"  (1.676 m)   Wt 112.9 kg   SpO2 97%   BMI 40.19 kg/m   Physical Exam  Constitutional: He is oriented to person, place, and time. He appears well-developed and well-nourished. No distress.  Calm and cooperative.  Pleasant. NAD  HENT:  Head: Normocephalic and atraumatic.  Bilateral cerumen impaction  Eyes: Pupils are equal, round, and reactive to light. Conjunctivae are normal. Right eye exhibits no discharge. Left eye exhibits no discharge. No scleral icterus.  Neck: Normal range of motion.  Dizziness is reproduced with neck rotation  Cardiovascular: Normal rate and regular rhythm.  Pulmonary/Chest: Effort normal and breath sounds normal. No respiratory distress.  Abdominal: He exhibits no distension.  Neurological: He is alert and oriented to person, place, and time.  Mental Status:  Alert, oriented, thought content appropriate, able to give a coherent history. Speech fluent without evidence of aphasia. Able to follow 2 step commands without difficulty.  Cranial Nerves:  II:  Peripheral visual fields grossly normal, pupils equal, round, reactive to light III,IV, VI: ptosis not present, extra-ocular motions intact bilaterally. Lateral nystagmus when looking to the right. V,VII: smile symmetric, facial light touch sensation equal VIII: hearing grossly normal to voice  X: uvula elevates symmetrically  XI: bilateral shoulder shrug symmetric and strong XII: midline tongue  extension without fassiculations Motor:  Normal tone. 5/5 in upper and lower extremities bilaterally including strong and equal grip strength and dorsiflexion/plantar flexion Sensory: Pinprick and light touch normal in all extremities.  Cerebellar: normal finger-to-nose with bilateral upper extremities Gait: not tested CV: distal pulses palpable throughout    Skin: Skin is warm and dry.  Psychiatric: He has a normal mood and affect. His behavior is normal.  Nursing note and vitals reviewed.    ED Treatments / Results  Labs (all labs ordered are listed, but only abnormal results are displayed) Labs Reviewed  BASIC METABOLIC PANEL - Abnormal; Notable for the following components:      Result Value   Glucose, Bld 163 (*)    All other components within normal limits  CBC  URINALYSIS, ROUTINE W REFLEX MICROSCOPIC    EKG None  Radiology Ct Head Wo Contrast  Result Date: 01/10/2018 CLINICAL DATA:  Dizziness occurring today. EXAM: CT HEAD WITHOUT CONTRAST TECHNIQUE: Contiguous axial images were obtained from the base of the skull through the vertex without intravenous contrast. COMPARISON:  None. FINDINGS: Brain: No evidence for acute infarction, hemorrhage, mass lesion, hydrocephalus, or extra-axial fluid. Normal for age cerebral volume. No white matter disease. There is a eccentric asymmetric curvilinear calcification just lateral to the anterior clinoid on the LEFT, subcentimeter size, which could represent a small meningioma, versus asymmetric hyperostosis. See series 4, image 19. No cavernous sinus mass. Vascular: No hyperdense vessel or unexpected calcification. Skull: Intact Sinuses/Orbits: No acute finding. Other: None IMPRESSION: No acute intracranial findings. No specific abnormality is seen which might contribute to dizziness. No focal intra-axial intracranial abnormality. Subtle asymmetric calcification LEFT anterior clinoid could represent a subcentimeter meningioma versus  asymmetric hyperostosis. No follow-up is recommended at this time. Electronically Signed   By: Elsie Stain M.D.   On: 01/10/2018 14:38    Procedures Procedures (including critical care time)  Medications Ordered in ED Medications  meclizine (ANTIVERT) tablet 25 mg (25 mg Oral Given 01/10/18 1443)     Initial Impression / Assessment and Plan / ED Course  I have reviewed the triage vital signs and the nursing notes.  Pertinent labs & imaging results that were available during my care of the patient were reviewed by me and considered in my medical decision making (see chart for details).  62 year old male presents with acute onset of severe dizziness and nausea while working with PT today. Most likely  related to vertigo which he's had before. EKG shows frequent PVCs however doubt this is related as the patient does not describe near syncopal symptoms. His neurologic exam is normal. Dizziness is reproduced with side to side head movement. No severe head or neck pain to suggest dissection. Will obtain labs, CT head and give Meclizine and reassess.  Labs are normal. CT head is negative other than incidental findings. He was ambulated to the bathroom and did not need assistance. He is feeling better although still symptomatic. His wife is comfortable with taking him home. Will rx Meclizine and give ENT f/u. Return precautions given.  Final Clinical Impressions(s) / ED Diagnoses   Final diagnoses:  Dizziness  Vertigo    ED Discharge Orders    None       Bethel Born, PA-C 01/10/18 1655    Little, Ambrose Finland, MD 01/11/18 [phone]

## 2018-01-10 NOTE — ED Notes (Signed)
Provided patient and wife with Sprite and graham crackers/peanut butter.

## 2018-01-10 NOTE — ED Notes (Signed)
Patient ambulated to restroom with standby assist. C/o dizziness but steady. PA at bedside.

## 2018-01-15 ENCOUNTER — Other Ambulatory Visit: Payer: Self-pay | Admitting: Orthopaedic Surgery

## 2018-01-15 DIAGNOSIS — M542 Cervicalgia: Secondary | ICD-10-CM

## 2018-01-23 ENCOUNTER — Ambulatory Visit
Admission: RE | Admit: 2018-01-23 | Discharge: 2018-01-23 | Disposition: A | Discharge status: Home / Self Care | Payer: BLUE CROSS/BLUE SHIELD | Source: Ambulatory Visit | Attending: Orthopaedic Surgery | Admitting: Orthopaedic Surgery

## 2018-01-23 DIAGNOSIS — M542 Cervicalgia: Secondary | ICD-10-CM

## 2018-01-28 ENCOUNTER — Other Ambulatory Visit: Payer: Self-pay | Admitting: Internal Medicine

## 2018-02-02 ENCOUNTER — Ambulatory Visit: Payer: Self-pay | Admitting: Adult Health

## 2018-02-27 ENCOUNTER — Other Ambulatory Visit: Payer: Self-pay | Admitting: Internal Medicine

## 2018-04-12 ENCOUNTER — Other Ambulatory Visit: Payer: Self-pay

## 2018-04-12 MED ORDER — AMLODIPINE BESYLATE 10 MG PO TABS: 10.0000 mg | ORAL_TABLET | Freq: Every day | ORAL | 1 refills | Status: DC

## 2018-04-12 MED ORDER — MONTELUKAST SODIUM 10 MG PO TABS: 10.0000 mg | ORAL_TABLET | Freq: Every day | ORAL | 1 refills | Status: DC

## 2018-04-12 MED ORDER — OMEPRAZOLE 40 MG PO CPDR: 40.0000 mg | DELAYED_RELEASE_CAPSULE | Freq: Two times a day (BID) | ORAL | 1 refills | Status: DC

## 2018-04-16 ENCOUNTER — Encounter: Payer: Self-pay | Admitting: Physician Assistant

## 2018-12-02 ENCOUNTER — Other Ambulatory Visit: Payer: Self-pay | Admitting: Physician Assistant

## 2018-12-07 ENCOUNTER — Other Ambulatory Visit: Payer: Self-pay | Admitting: Physician Assistant

## 2019-02-18 MED ORDER — AMLODIPINE BESYLATE 10 MG PO TABS: 10.0000 mg | ORAL_TABLET | Freq: Every day | ORAL | 0 refills | Status: DC

## 2019-02-18 MED ORDER — LISINOPRIL 20 MG PO TABS: 20.0000 mg | ORAL_TABLET | Freq: Every day | ORAL | 0 refills | Status: DC

## 2019-02-18 MED ORDER — MONTELUKAST SODIUM 10 MG PO TABS: 10.0000 mg | ORAL_TABLET | Freq: Every day | ORAL | 0 refills | Status: DC

## 2019-03-01 ENCOUNTER — Other Ambulatory Visit: Payer: Self-pay | Admitting: Internal Medicine

## 2019-03-01 ENCOUNTER — Other Ambulatory Visit: Payer: Self-pay | Admitting: Physician Assistant

## 2019-03-01 MED ORDER — OMEPRAZOLE 40 MG PO CPDR: DELAYED_RELEASE_CAPSULE | ORAL | 1 refills | Status: DC

## 2019-03-15 NOTE — Progress Notes (Signed)
FOLLOW UP  Assessment and Plan:   Hypertension Well controlled with current medications  Monitor blood pressure at home; patient to call if consistently greater than 130/80 Continue DASH diet.   Reminder to go to the ER if any CP, SOB, nausea, dizziness, severe HA, changes vision/speech, left arm numbness and tingling and jaw pain.  Cholesterol Currently severely above goal, not on medication; has documented statin intolerance; and could not tolerate zetia- will give samples and RX of nexletrol.  Continue low cholesterol diet and exercise.  Check lipid panel, goal less than 70 Check LFTS in 2-4 weeks lab only  Diabetes without complications Continue medication: currently treated by lifestyle only Continue diet and exercise.  Perform daily foot/skin check, notify office of any concerning changes.  Check A1C  Obesity with co morbidities Long discussion about weight loss, diet, and exercise Recommended diet heavy in fruits and veggies and low in animal meats, cheeses, and dairy products, appropriate calorie intake Patient will work on cutting down on carbs, high fiber, low glycemic index Will follow up in 3 months  Vitamin D Def Check levels  Continue diet and meds as discussed. Further disposition pending results of labs. Discussed med's effects and SE's.   Over 30 minutes of exam, counseling, chart review, and critical decision making was performed.   Future Appointments  Date Time Provider [place]  04/01/2019 11:00 AM [facility] None  06/20/2019 11:00 AM Vicie Mutters, PA-C [facility] None    ----------------------------------------------------------------------------------------------------------------------  HPI 64 y.o. male  presents for follow up, last seen 08/2017, back after change of insurance, he has history of hypertension, cholesterol, diabetes, morbid obesity and vitamin D deficiency.   Had COVID 19 beginning of Jan. Has recovered.    BMI is Body mass index is 41.55 kg/m., he has been working on diet, he is trying to keep below 1500, exercise is limited due to orthopedic conditions.  Wt Readings from Last 3 Encounters:  03/18/19 257 lb 6.4 oz (116.8 kg)  01/10/18 249 lb (112.9 kg)  09/15/17 255 lb (115.7 kg)   His blood pressure has been on for a while but he is back on the amlodipine and lisinopril, today their BP is BP: (!) 146/88 recheck 138/88.  He does not workout. He denies chest pain, shortness of breath, dizziness.   He is not on cholesterol medication, could not tolerate zetia and any statins due to flu like symptoms. His cholesterol is not at goal. The cholesterol last visit was:   Lab Results  Component Value Date   CHOL 263 (H) 03/18/2019   HDL 43 03/18/2019   LDLCALC 188 (H) 03/18/2019   TRIG 158 (H) 03/18/2019   CHOLHDL 6.1 (H) 03/18/2019    He has been working on diet for T2 diabetes, and denies foot ulcerations, increased appetite, nausea, paresthesia of the feet, polydipsia, polyuria, visual disturbances, vomiting and weight loss. Last A1C in the office was:  Lab Results  Component Value Date   HGBA1C 7.9 (H) 03/18/2019   Patient is on Vitamin D supplement, 6000 IU a day   Lab Results  Component Value Date   VD25OH 40 03/18/2019        Current Medications:  Current Outpatient Medications on File Prior to Visit  Medication Sig  . amLODipine (NORVASC) 10 MG tablet Take 1 tablet (10 mg total) by mouth daily.  . Cholecalciferol (VITAMIN D PO) Take 2,000 Units by mouth daily.   . fluticasone (FLONASE) 50 MCG/ACT nasal spray Place 2 sprays  into both nostrils at bedtime.  Marland Kitchen L-LYSINE PO Take 500 mg by mouth daily.   Marland Kitchen lisinopril (ZESTRIL) 20 MG tablet Take 1 tablet (20 mg total) by mouth daily.  Marland Kitchen loratadine (CLARITIN) 10 MG tablet TAKE ONE TABLET BY MOUTH DAILY  . montelukast (SINGULAIR) 10 MG tablet Take 1 tablet (10 mg total) by mouth daily.  Marland Kitchen omeprazole (PRILOSEC) 40 MG capsule Take 1  capsule 2 x /day for Indigestion & Heartburn   No current facility-administered medications on file prior to visit.     Allergies:  Allergies  Allergen Reactions  . Statins Other (See Comments)    Flu-like symptoms  . Tylenol [Acetaminophen]     Liver enzymes elevated so patient has stopped taking anything with acetaminophen in it  . Phentermine Other (See Comments)    constipation     Medical History:  Past Medical History:  Diagnosis Date  . DDD lumbar   . DJD (degenerative joint disease)   . GERD (gastroesophageal reflux disease)    takes Omeprazole daily  . Hyperlipidemia   . Hypertension    takes Lisinopril and Amlodipine daily  . Multiple allergies    takes Claritin and Singulair daily  . PONV (postoperative nausea and vomiting)    Family history- Reviewed and unchanged Social history- Reviewed and unchanged   Review of Systems:  Review of Systems  Constitutional: Negative for malaise/fatigue and weight loss.  HENT: Negative for hearing loss and tinnitus.   Eyes: Negative for blurred vision and double vision.  Respiratory: Negative for cough, shortness of breath and wheezing.   Cardiovascular: Negative for chest pain, palpitations, orthopnea, claudication and leg swelling.  Gastrointestinal: Negative for abdominal pain, blood in stool, constipation, diarrhea, heartburn, melena, nausea and vomiting.  Genitourinary: Negative.   Musculoskeletal: Positive for back pain and joint pain (bilateral hips, followed by Dr. Fara Chute). Negative for myalgias.  Skin: Negative for rash.  Neurological: Negative for dizziness, tingling, sensory change, weakness and headaches.  Endo/Heme/Allergies: Negative for polydipsia.  Psychiatric/Behavioral: Negative.   All other systems reviewed and are negative.     Physical Exam: BP (!) 146/88   Pulse 66   Temp (!) 97.5 F (36.4 C)   Wt 257 lb 6.4 oz (116.8 kg)   SpO2 97%   BMI 41.55 kg/m  Wt Readings from Last 3 Encounters:   03/18/19 257 lb 6.4 oz (116.8 kg)  01/10/18 249 lb (112.9 kg)  09/15/17 255 lb (115.7 kg)   General Appearance: morbidly obese, in no apparent distress. Eyes: PERRLA, EOMs, conjunctiva no swelling or erythema Sinuses: No Frontal/maxillary tenderness ENT/Mouth: Ext aud canals clear, TMs without erythema, bulging. No erythema, swelling, or exudate on post pharynx.  Tonsils not swollen or erythematous. Hearing normal.  Neck: Supple, thyroid normal.  Respiratory: Respiratory effort normal, BS equal bilaterally without rales, rhonchi, wheezing or stridor.  Cardio: RRR with no MRGs. Brisk peripheral pulses without edema.  Abdomen: Soft, obese, + BS.  Non tender, no guarding, rebound, hernias, masses. Lymphatics: Non tender without lymphadenopathy.  Musculoskeletal: Full ROM, 5/5 strength, Normal gait Skin: Warm, dry without rashes, lesions, ecchymosis.  Neuro: Cranial nerves intact. No cerebellar symptoms.  Psych: Awake and oriented X 3, normal affect, Insight and Judgment appropriate.    Quentin Mulling, PA-C 8:50 AM [facility]

## 2019-03-18 ENCOUNTER — Other Ambulatory Visit: Payer: Self-pay

## 2019-03-18 ENCOUNTER — Ambulatory Visit: Payer: 59 | Admitting: Physician Assistant

## 2019-03-18 ENCOUNTER — Encounter: Payer: Self-pay | Admitting: Physician Assistant

## 2019-03-18 VITALS — BP 146/88 | HR 66 | Temp 97.5°F | Wt 257.4 lb

## 2019-03-18 DIAGNOSIS — Z789 Other specified health status: Secondary | ICD-10-CM

## 2019-03-18 DIAGNOSIS — G720 Drug-induced myopathy: Secondary | ICD-10-CM | POA: Insufficient documentation

## 2019-03-18 DIAGNOSIS — E1169 Type 2 diabetes mellitus with other specified complication: Secondary | ICD-10-CM | POA: Diagnosis not present

## 2019-03-18 DIAGNOSIS — U071 COVID-19: Secondary | ICD-10-CM

## 2019-03-18 DIAGNOSIS — E785 Hyperlipidemia, unspecified: Secondary | ICD-10-CM | POA: Diagnosis not present

## 2019-03-18 DIAGNOSIS — Z79899 Other long term (current) drug therapy: Secondary | ICD-10-CM

## 2019-03-18 DIAGNOSIS — E559 Vitamin D deficiency, unspecified: Secondary | ICD-10-CM | POA: Diagnosis not present

## 2019-03-18 DIAGNOSIS — I700 Atherosclerosis of aorta: Secondary | ICD-10-CM

## 2019-03-18 DIAGNOSIS — I10 Essential (primary) hypertension: Secondary | ICD-10-CM

## 2019-03-18 HISTORY — DX: COVID-19: U07.1

## 2019-03-18 MED ORDER — NEXLETOL 180 MG PO TABS: 180.0000 mg | ORAL_TABLET | Freq: Every day | ORAL | 0 refills | Status: DC

## 2019-03-18 NOTE — Patient Instructions (Addendum)
WATER IS IMPORTANT  Being dehydrated can hurt your kidneys, cause fatigue, headaches, muscle aches, joint pain, and dry skin/nails so please increase your fluids.   Drink 80-100 oz a day of water, measure it out! Eat 3 meals a day, have to do breakfast, eat protein- hard boiled eggs, protein bar like nature valley protein bar, greek yogurt like oikos triple zero, chobani 100, or light n fit greek  Can check out plantnanny app on your phone to help you keep track of your water  Your LDL could improve, ideally we want it under a 100.  Your LDL is the bad cholesterol that can lead to heart attack and stroke. To lower your number you can decrease your fatty foods, red meat, cheese, milk and increase fiber like whole grains and veggies. You can also add a fiber supplement like Citracel or Benefiber, these do not cause gas and bloating and are safe to use. Especially if you have a strong family history of heart disease or stroke or you have evidence of plaque on any imaging like a chest xray, we may discuss at your next office visit putting you on a medication to get your number below 100.  Will try you on nexletol.     Intermittent fasting is more about strategy than starvation. It's meant to reset your body in different ways, hopefully with fitness and nutrition changes as a result.  Like any big switchover, though, results may vary when it comes down to the individual level. What works for your friends may not work for you, or vice versa. That's why it's helpful to play around with variations on intermittent fasting and healthy habits and find what works best for you.  WHAT IS INTERMITTENT FASTING AND WHY DO IT?  Intermittent fasting doesn't involve specific foods, but rather, a strict schedule regarding when you eat. Also called "time-restricted eating," the tactic has been praised for its contribution to weight loss, improved body composition, and decreased cravings. Preliminary research also  suggests it may be beneficial for glucose tolerance, hormone regulation, better muscle mass and lower body fat.  Part of its appeal is the simplicity of the effort. Unlike some other trends, there's no calculations to intermittent fasting.  You simply eat within a certain block of time, usually a window of 8-10 hours. In the other big block of time - about 14-16 hours, including when you're asleep - you don't eat anything, not even snacks. You can drink water, coffee, tea or any other beverage that doesn't have calories.  For example, if you like having a late dinner, you might skip breakfast and have your first meal at noon and your last meal of the day at 8 p.m., and then not eat until noon again the next day.  IDEAS FOR GETTING STARTED  If you're new to the strategy, it may be helpful to eat within the typical circadian rhythm and keep eating within daylight hours. This can be especially beneficial if you're looking at intermittent fasting for weight-loss goals.  So first try only eating between 12pm to 8pm.  Outside of this time you may have water, black coffee, and hot tea. You may not eat it drink anything that has carbs, sugars, OR artificial sugars like diet soda.   Like any major eating and fitness shift, it can take time to find the perfect fit, so don't be afraid to experiment with different options - including ditching intermittent fasting altogether if it's simply not for you. But if  it is, you may be surprised by some of the benefits that come along with the strategy.  HYPERTENSION INFORMATION  Monitor your blood pressure at home, please keep a record and bring that in with you to your next office visit.   Go to the ER if any CP, SOB, nausea, dizziness, severe HA, changes vision/speech  Testing/Procedures: HOW TO TAKE YOUR BLOOD PRESSURE:  Rest 5 minutes before taking your blood pressure.  Don't smoke or drink caffeinated beverages for at least 30 minutes before.  Take  your blood pressure before (not after) you eat.  Sit comfortably with your back supported and both feet on the floor (don't cross your legs).  Elevate your arm to heart level on a table or a desk.  Use the proper sized cuff. It should fit smoothly and snugly around your bare upper arm. There should be enough room to slip a fingertip under the cuff. The bottom edge of the cuff should be 1 inch above the crease of the elbow.  Due to a recent study, SPRINT, we have changed our goal for the systolic or top blood pressure number. Ideally we want your top number at 120.  In the [facility] Trial, 5000 people were randomized to a goal BP of 120 and 5000 people were randomized to a goal BP of less than 140. The patients with the goal BP at 120 had LESS DEMENTIA, LESS HEART ATTACKS, AND LESS STROKES, AS WELL AS OVERALL DECREASED MORTALITY OR DEATH RATE.   There was another study that showed taking your blood pressure medications at night decrease cardiovascular events.  However if you are on a fluid pill, please take this in the morning.   If you are willing, our goal BP is the top number of 120.  Your most recent BP: BP: (!) 146/88 - if your number is high at home, we we can increase the lisinopril to 20 mg twice a day.   Take your medications faithfully as instructed. Maintain a healthy weight. Get at least 150 minutes of aerobic exercise per week. Minimize salt intake. Minimize alcohol intake  DASH Eating Plan DASH stands for "Dietary Approaches to Stop Hypertension." The DASH eating plan is a healthy eating plan that has been shown to reduce high blood pressure (hypertension). Additional health benefits may include reducing the risk of type 2 diabetes mellitus, heart disease, and stroke. The DASH eating plan may also help with weight loss. WHAT DO I NEED TO KNOW ABOUT THE DASH EATING PLAN? For the DASH eating plan, you will follow these general guidelines:  Choose foods with a percent daily value  for sodium of less than 5% (as listed on the food label).  Use salt-free seasonings or herbs instead of table salt or sea salt.  Check with your health care provider or pharmacist before using salt substitutes.  Eat lower-sodium products, often labeled as "lower sodium" or "no salt added."  Eat fresh foods.  Eat more vegetables, fruits, and low-fat dairy products.  Choose whole grains. Look for the word "whole" as the first word in the ingredient list.  Choose fish and skinless chicken or [place] more often than red meat. Limit fish, poultry, and meat to 6 oz (170 g) each day.  Limit sweets, desserts, sugars, and sugary drinks.  Choose heart-healthy fats.  Limit cheese to 1 oz (28 g) per day.  Eat more home-cooked food and less restaurant, buffet, and fast food.  Limit fried foods.  [place] foods using methods other than  frying.  Limit canned vegetables. If you do use them, rinse them well to decrease the sodium.  When eating at a restaurant, ask that your food be prepared with less salt, or no salt if possible. WHAT FOODS CAN I EAT? Seek help from a dietitian for individual calorie needs. Grains Whole grain or whole wheat bread. Brown rice. Whole grain or whole wheat pasta. Quinoa, bulgur, and whole grain cereals. Low-sodium cereals. Corn or whole wheat flour tortillas. Whole grain cornbread. Whole grain crackers. Low-sodium crackers. Vegetables Fresh or frozen vegetables (raw, steamed, roasted, or grilled). Low-sodium or reduced-sodium tomato and vegetable juices. Low-sodium or reduced-sodium tomato sauce and paste. Low-sodium or reduced-sodium canned vegetables.  Fruits All fresh, canned (in natural juice), or frozen fruits. Meat and Other Protein Products Ground beef (85% or leaner), grass-fed beef, or beef trimmed of fat. Skinless chicken or [place]. Ground chicken or [place]. Pork trimmed of fat. All fish and seafood. Eggs. Dried beans, peas, or lentils. Unsalted nuts and  seeds. Unsalted canned beans. Dairy Low-fat dairy products, such as skim or 1% milk, 2% or reduced-fat cheeses, low-fat ricotta or cottage cheese, or plain low-fat yogurt. Low-sodium or reduced-sodium cheeses. Fats and Oils Tub margarines without trans fats. Light or reduced-fat mayonnaise and salad dressings (reduced sodium). Avocado. Safflower, olive, or canola oils. Natural peanut or almond butter. Other Unsalted popcorn and pretzels. The items listed above may not be a complete list of recommended foods or beverages. Contact your dietitian for more options. WHAT FOODS ARE NOT RECOMMENDED? Grains White bread. White pasta. White rice. Refined cornbread. Bagels and croissants. Crackers that contain trans fat. Vegetables Creamed or fried vegetables. Vegetables in a cheese sauce. Regular canned vegetables. Regular canned tomato sauce and paste. Regular tomato and vegetable juices. Fruits Dried fruits. Canned fruit in light or heavy syrup. Fruit juice. Meat and Other Protein Products Fatty cuts of meat. Ribs, chicken wings, bacon, sausage, bologna, salami, chitterlings, fatback, hot dogs, bratwurst, and packaged luncheon meats. Salted nuts and seeds. Canned beans with salt. Dairy Whole or 2% milk, cream, half-and-half, and cream cheese. Whole-fat or sweetened yogurt. Full-fat cheeses or blue cheese. Nondairy creamers and whipped toppings. Processed cheese, cheese spreads, or cheese curds. Condiments Onion and garlic salt, seasoned salt, table salt, and sea salt. Canned and packaged gravies. Worcestershire sauce. Tartar sauce. Barbecue sauce. Teriyaki sauce. Soy sauce, including reduced sodium. Steak sauce. Fish sauce. Oyster sauce. Cocktail sauce. Horseradish. Ketchup and mustard. Meat flavorings and tenderizers. Bouillon cubes. Hot sauce. Tabasco sauce. Marinades. Taco seasonings. Relishes. Fats and Oils Butter, stick margarine, lard, shortening, ghee, and bacon fat. Coconut, palm kernel, or  palm oils. Regular salad dressings. Other Pickles and olives. Salted popcorn and pretzels. The items listed above may not be a complete list of foods and beverages to avoid. Contact your dietitian for more information. WHERE CAN I FIND MORE INFORMATION? National Heart, Lung, and Blood Institute: travelstabloid.com Document Released: 01/27/2011 Document Revised: 06/24/2013 Document Reviewed: 12/12/2012 [facility] Patient Information 2015 [place], [place]. This information is not intended to replace advice given to you by your health care provider. Make sure you discuss any questions you have with your health care provider.

## 2019-03-19 LAB — CBC WITH DIFFERENTIAL/PLATELET
Absolute Monocytes: 519 cells/uL (ref 200–950)
Basophils Absolute: 18 cells/uL (ref 0–200)
Basophils Relative: 0.3 %
Eosinophils Absolute: 128 cells/uL (ref 15–500)
Eosinophils Relative: 2.1 %
HCT: 45.6 % (ref 38.5–50.0)
Hemoglobin: 15.1 g/dL (ref 13.2–17.1)
Lymphs Abs: 1781 cells/uL (ref 850–3900)
MCH: 28 pg (ref 27.0–33.0)
MCHC: 33.1 g/dL (ref 32.0–36.0)
MCV: 84.6 fL (ref 80.0–100.0)
MPV: 10.5 fL (ref 7.5–12.5)
Monocytes Relative: 8.5 %
Neutro Abs: 3654 cells/uL (ref 1500–7800)
Neutrophils Relative %: 59.9 %
Platelets: 376 10*3/uL (ref 140–400)
RBC: 5.39 10*6/uL (ref 4.20–5.80)
RDW: 12.9 % (ref 11.0–15.0)
Total Lymphocyte: 29.2 %
WBC: 6.1 10*3/uL (ref 3.8–10.8)

## 2019-03-19 LAB — COMPLETE METABOLIC PANEL WITH GFR
AG Ratio: 1.6 (calc) (ref 1.0–2.5)
ALT: 32 U/L (ref 9–46)
AST: 23 U/L (ref 10–35)
Albumin: 4.4 g/dL (ref 3.6–5.1)
Alkaline phosphatase (APISO): 80 U/L (ref 35–144)
BUN: 17 mg/dL (ref 7–25)
CO2: 29 mmol/L (ref 20–32)
Calcium: 9.9 mg/dL (ref 8.6–10.3)
Chloride: 107 mmol/L (ref 98–110)
Creat: 0.89 mg/dL (ref 0.70–1.25)
GFR, Est African American: 107 mL/min/{1.73_m2} (ref >=60)
GFR, Est Non African American: 92 mL/min/{1.73_m2} (ref >=60)
Globulin: 2.8 g/dL (calc) (ref 1.9–3.7)
Glucose, Bld: 150 mg/dL — ABNORMAL HIGH (ref 65–99)
Potassium: 4.9 mmol/L (ref 3.5–5.3)
Sodium: 144 mmol/L (ref 135–146)
Total Bilirubin: 0.4 mg/dL (ref 0.2–1.2)
Total Protein: 7.2 g/dL (ref 6.1–8.1)

## 2019-03-19 LAB — LIPID PANEL
Cholesterol: 263 mg/dL — ABNORMAL HIGH (ref <200)
HDL: 43 mg/dL (ref >=40)
LDL Cholesterol (Calc): 188 mg/dL (calc) — ABNORMAL HIGH
Non-HDL Cholesterol (Calc): 220 mg/dL (calc) — ABNORMAL HIGH (ref <130)
Total CHOL/HDL Ratio: 6.1 (calc) — ABNORMAL HIGH (ref <5.0)
Triglycerides: 158 mg/dL — ABNORMAL HIGH (ref <150)

## 2019-03-19 LAB — HEMOGLOBIN A1C
Hgb A1c MFr Bld: 7.9 % of total Hgb — ABNORMAL HIGH (ref <5.7)
Mean Plasma Glucose: 180 (calc)
eAG (mmol/L): 10 (calc)

## 2019-03-19 LAB — VITAMIN D 25 HYDROXY (VIT D DEFICIENCY, FRACTURES): Vit D, 25-Hydroxy: 40 ng/mL (ref 30–100)

## 2019-03-19 LAB — TSH: TSH: 1.21 mIU/L (ref 0.40–4.50)

## 2019-03-19 LAB — MAGNESIUM: Magnesium: 1.8 mg/dL (ref 1.5–2.5)

## 2019-04-01 ENCOUNTER — Other Ambulatory Visit: Payer: 59

## 2019-05-16 ENCOUNTER — Other Ambulatory Visit: Payer: Self-pay | Admitting: Physician Assistant

## 2019-06-20 ENCOUNTER — Ambulatory Visit: Payer: 59 | Admitting: Physician Assistant

## 2019-07-12 NOTE — Progress Notes (Signed)
FOLLOW UP  Assessment and Plan:   Statin myopathy May benefit from referral to lipid clinic  Screening, anemia, deficiency, iron -     Iron,Total/Total Iron Binding Cap -     Ferritin -     Vitamin B12  Cervical radicular pain -     MR Cervical Spine Wo Contrast; Future  Will refer to Dr. Yisroel Ramming  Per exam possible C6-C7 worsening will repeat MRI  Hypertension Well controlled with current medications  Monitor blood pressure at home; patient to call if consistently greater than 130/80 Continue DASH diet.   Reminder to go to the ER if any CP, SOB, nausea, dizziness, severe HA, changes vision/speech, left arm numbness and tingling and jaw pain.  Cholesterol Currently severely above goal, not on medication; has documented statin intolerance; and could not tolerate zetia, could not afford nexletrol Continue low cholesterol diet and exercise.  Check lipid panel, goal less than 70  Diabetes with hyperlipidiemia Continue medication: currently treated by lifestyle only Continue diet and exercise.  Perform daily foot/skin check, notify office of any concerning changes.  Check A1C  Obesity with co morbidities Long discussion about weight loss, diet, and exercise Recommended diet heavy in fruits and veggies and low in animal meats, cheeses, and dairy products, appropriate calorie intake Patient will work on cutting down on carbs, high fiber, low glycemic index Will follow up in 3 months  Vitamin D Def Check levels  Continue diet and meds as discussed. Further disposition pending results of labs. Discussed med's effects and SE's.   Over 30 minutes of exam, counseling, chart review, and critical decision making was performed.   Future Appointments  Date Time Provider Department Center  07/16/2019 11:45 AM Quentin Mulling, PA-C [id number] None    ----------------------------------------------------------------------------------------------------------------------  HPI 64 y.o.  male  presents for follow up, last seen 08/2017, back after change of insurance, he has history of hypertension, cholesterol, diabetes, morbid obesity and vitamin D deficiency.   Had COVID 19 beginning of Jan. Has recovered.   He is right handed, has history of cervical spine stenosis/disc bulging, has seen Dr. Yisroel Ramming in the past. For last 3 months he has been having right neck/shoulder/hand pain with numbness in his right hand, will have pain with lifting his right arm up, pain radiates down into his hand. Worse at night trying to sleep. Saturday he has 2.5 hours of sleep. Pain level is a 7. Not with exertion, no SOB, no CP.  MRI cervical 01/2018 showed: 1. Disc bulging with uncovertebral hypertrophy at C4-5 with resultant mild spinal stenosis, with moderate left and mild right C5 foraminal narrowing. 2. Broad-based disc bulge at C3-4 with resultant mild spinal stenosis. 3. Reversal of the normal upper cervical lordosis.  BMI is There is no height or weight on file to calculate BMI., he has been working on diet, he is trying to keep below 1500, exercise is limited due to orthopedic conditions.  Wt Readings from Last 3 Encounters:  03/18/19 257 lb 6.4 oz (116.8 kg)  01/10/18 249 lb (112.9 kg)  09/15/17 255 lb (115.7 kg)   His blood pressure has been on for a while but he is back on the amlodipine and lisinopril, today their BP is   recheck 138/88.  He does not workout. He denies chest pain, shortness of breath, dizziness.   He is not on cholesterol medication, could not tolerate zetia and any statins due to flu like symptoms- given samples of nexletol last visit.  His cholesterol is  not at goal. The cholesterol last visit was:   Lab Results  Component Value Date   CHOL 263 (H) 03/18/2019   HDL 43 03/18/2019   LDLCALC 188 (H) 03/18/2019   TRIG 158 (H) 03/18/2019   CHOLHDL 6.1 (H) 03/18/2019    He has been working on diet for T2 diabetes With hyperlipdemia unable to tolerate statins,  zetia, and could not afford nexletol No CKD and denies foot ulcerations, increased appetite, nausea, paresthesia of the feet, polydipsia, polyuria, visual disturbances, vomiting and weight loss.  Last A1C in the office was:  Lab Results  Component Value Date   HGBA1C 7.9 (H) 03/18/2019   Lab Results  Component Value Date   GFRNONAA 92 03/18/2019   Patient is on Vitamin D supplement, 6000 IU a day   Lab Results  Component Value Date   VD25OH 40 03/18/2019       Current Medications:  Current Outpatient Medications on File Prior to Visit  Medication Sig  . amLODipine (NORVASC) 10 MG tablet TAKE ONE TABLET BY MOUTH DAILY  . Bempedoic Acid (NEXLETOL) 180 MG TABS Take 180 mg by mouth daily.  . Cholecalciferol (VITAMIN D PO) Take 2,000 Units by mouth daily.   . fluticasone (FLONASE) 50 MCG/ACT nasal spray Place 2 sprays into both nostrils at bedtime.  Marland Kitchen L-LYSINE PO Take 500 mg by mouth daily.   Marland Kitchen lisinopril (ZESTRIL) 20 MG tablet TAKE ONE TABLET BY MOUTH DAILY  . loratadine (CLARITIN) 10 MG tablet TAKE ONE TABLET BY MOUTH DAILY  . montelukast (SINGULAIR) 10 MG tablet TAKE ONE TABLET BY MOUTH DAILY  . omeprazole (PRILOSEC) 40 MG capsule TAKE ONE CAPSULE BY MOUTH TWICE A DAY   No current facility-administered medications on file prior to visit.     Allergies:  Allergies  Allergen Reactions  . Statins Other (See Comments)    Flu-like symptoms  . Tylenol [Acetaminophen]     Liver enzymes elevated so patient has stopped taking anything with acetaminophen in it  . Phentermine Other (See Comments)    constipation     Medical History:  Past Medical History:  Diagnosis Date  . DDD lumbar   . DJD (degenerative joint disease)   . GERD (gastroesophageal reflux disease)    takes Omeprazole daily  . Hyperlipidemia   . Hypertension    takes Lisinopril and Amlodipine daily  . Multiple allergies    takes Claritin and Singulair daily  . PONV (postoperative nausea and vomiting)     Family history- Reviewed and unchanged Social history- Reviewed and unchanged   Review of Systems:  Review of Systems  Constitutional: Negative for malaise/fatigue and weight loss.  HENT: Negative for hearing loss and tinnitus.   Eyes: Negative for blurred vision and double vision.  Respiratory: Negative for cough, shortness of breath and wheezing.   Cardiovascular: Negative for chest pain, palpitations, orthopnea, claudication and leg swelling.  Gastrointestinal: Negative for abdominal pain, blood in stool, constipation, diarrhea, heartburn, melena, nausea and vomiting.  Genitourinary: Negative.   Musculoskeletal: Positive for back pain and joint pain (bilateral hips, followed by Dr. Fara Chute). Negative for myalgias.  Skin: Negative for rash.  Neurological: Negative for dizziness, tingling, sensory change, weakness and headaches.  Endo/Heme/Allergies: Negative for polydipsia.  Psychiatric/Behavioral: Negative.   All other systems reviewed and are negative.     Physical Exam: There were no vitals taken for this visit. Wt Readings from Last 3 Encounters:  03/18/19 257 lb 6.4 oz (116.8 kg)  01/10/18 249 lb (112.9  kg)  09/15/17 255 lb (115.7 kg)   General Appearance: morbidly obese, in no apparent distress. Eyes: PERRLA, EOMs, conjunctiva no swelling or erythema Sinuses: No Frontal/maxillary tenderness ENT/Mouth: Ext aud canals clear, TMs without erythema, bulging. No erythema, swelling, or exudate on post pharynx.  Tonsils not swollen or erythematous. Hearing normal.  Neck: Supple, thyroid normal.  Respiratory: Respiratory effort normal, BS equal bilaterally without rales, rhonchi, wheezing or stridor.  Cardio: RRR with no MRGs. Brisk peripheral pulses without edema.  Abdomen: Soft, obese, + BS.  Non tender, no guarding, rebound, hernias, masses. Lymphatics: Non tender without lymphadenopathy.  Musculoskeletal: Full ROM, 5/5 strength, Normal gait. He has normal sensation  bilateral hands, decreased grip and thumb strength on the right, decreased brachial reflex.  Skin: Warm, dry without rashes, lesions, ecchymosis.  Neuro: Cranial nerves intact. No cerebellar symptoms.  Psych: Awake and oriented X 3, normal affect, Insight and Judgment appropriate.    Vicie Mutters, PA-C 12:29 PM [facility]

## 2019-07-16 ENCOUNTER — Other Ambulatory Visit: Payer: Self-pay

## 2019-07-16 ENCOUNTER — Ambulatory Visit: Payer: 59 | Admitting: Physician Assistant

## 2019-07-16 ENCOUNTER — Encounter: Payer: Self-pay | Admitting: Physician Assistant

## 2019-07-16 VITALS — BP 136/80 | HR 65 | Temp 97.6°F | Ht 65.0 in | Wt 258.0 lb

## 2019-07-16 DIAGNOSIS — M5412 Radiculopathy, cervical region: Secondary | ICD-10-CM

## 2019-07-16 DIAGNOSIS — Z79899 Other long term (current) drug therapy: Secondary | ICD-10-CM | POA: Diagnosis not present

## 2019-07-16 DIAGNOSIS — G720 Drug-induced myopathy: Secondary | ICD-10-CM

## 2019-07-16 DIAGNOSIS — Z130 Encounter for screening for diseases of the blood and blood-forming organs and certain disorders involving the immune mechanism: Secondary | ICD-10-CM

## 2019-07-16 DIAGNOSIS — E1169 Type 2 diabetes mellitus with other specified complication: Secondary | ICD-10-CM

## 2019-07-16 DIAGNOSIS — E559 Vitamin D deficiency, unspecified: Secondary | ICD-10-CM | POA: Diagnosis not present

## 2019-07-16 DIAGNOSIS — I10 Essential (primary) hypertension: Secondary | ICD-10-CM

## 2019-07-16 DIAGNOSIS — E785 Hyperlipidemia, unspecified: Secondary | ICD-10-CM | POA: Diagnosis not present

## 2019-07-16 DIAGNOSIS — I700 Atherosclerosis of aorta: Secondary | ICD-10-CM

## 2019-07-16 NOTE — Patient Instructions (Signed)
VENOUS INSUFFICIENCY Our lower leg venous system is not the most reliable, the heart does NOT pump fluid up, there is a valve system.  The muscles of the leg squeeze and the blood moves up and a valve opens and close, then they squeeze, blood moves up and valves open and closes keeping the blood moving towards the heart.  Lots can go wrong with this valve system.  If someone is sitting or standing without movement, everyone will get swelling.  THINGS TO DO:  Do not stand or sit in one position for long periods of time. Do not sit with your legs crossed. Rest with your legs raised during the day.  Your legs have to be higher than your heart so that gravity will force the valves to open, so please really elevate your legs.   Wear elastic stockings or support hose. Do not wear other tight, encircling garments around the legs, pelvis, or waist.  [employer]  has a wide variety of well priced compression stockings. [address] [place], [place] [address] [phone]  Or [employer] has a good cheap selection, I like the socks, they are not as hard to get on  Walk as much as possible to increase blood flow.  Raise the foot of your bed at night with 2-inch blocks.  SEEK MEDICAL CARE IF:   The skin around your ankle starts to break down.  You have pain, redness, tenderness, or hard swelling developing in your leg over a vein.  You are uncomfortable due to leg pain.  If you ever have shortness of breath with exertion or chest pain go to the ER.    Cervical Radiculopathy  Cervical radiculopathy happens when a nerve in the neck (a cervical nerve) is pinched or bruised. This condition can happen because of an injury to the cervical spine (vertebrae) in the neck, or as part of the normal aging process. Pressure on the cervical nerves can cause pain or numbness that travels from the neck all the way down into the arm and fingers. Usually, this condition gets better with rest. Treatment may  be needed if the condition does not improve. What are the causes? This condition may be caused by:  A neck injury.  A bulging (herniated) disk.  Muscle spasms.  Muscle tightness in the neck because of overuse.  Arthritis.  Breakdown or degeneration in the bones and joints of the spine (spondylosis) due to aging.  Bone spurs that may develop near the cervical nerves. What are the signs or symptoms? Symptoms of this condition include:  Pain. The pain may travel from the neck to the arm and hand. The pain can be severe or irritating. It may be worse when you move your neck.  Numbness or tingling in your arm or hand.  Weakness in the affected arm and hand, in severe cases. How is this diagnosed? This condition may be diagnosed based on your symptoms, your medical history, and a physical exam. You may also have tests, including:  X-rays.  A CT scan.  An MRI.  An electromyogram (EMG).  Nerve conduction tests. How is this treated? In many cases, treatment is not needed for this condition. With rest, the condition usually gets better over time. If treatment is needed, options may include:  Wearing a soft neck collar (cervical collar) for short periods of time, as told by your health care provider.  Doing physical therapy to strengthen your neck muscles.  Taking medicines, such as NSAIDs or  oral corticosteroids.  Having spinal injections, in severe cases.  Having surgery. This may be needed if other treatments do not help. Different types of surgery may be done depending on the cause of this condition. Follow these instructions at home: If you have a cervical collar:  Wear it as told by your health care provider. Remove it only as told by your health care provider.  Ask your health care provider if you can remove the collar for cleaning and bathing. If you are allowed to remove the collar for cleaning or bathing: ? Follow instructions from your health care provider about  how to remove the collar safely. ? Clean the collar by wiping it with mild soap and water and drying it completely. ? Take out any removable pads in the collar every 1-2 days, and wash them by hand with soap and water. Let them air-dry completely before you put them back in the collar. ? Check your skin under the collar for irritation or sores. If you see any, tell your health care provider. Managing pain      Take over-the-counter and prescription medicines only as told by your health care provider.  If directed, put ice on the affected area. ? If you have a soft neck collar, remove it as told by your health care provider. ? Put ice in a plastic bag. ? Place a towel between your skin and the bag. ? Leave the ice on for 20 minutes, 2-3 times a day.  If applying ice does not help, you can try using heat. Use the heat source that your health care provider recommends, such as a moist heat pack or a heating pad. ? Place a towel between your skin and the heat source. ? Leave the heat on for 20-30 minutes. ? Remove the heat if your skin turns bright red. This is especially important if you are unable to feel pain, heat, or cold. You may have a greater risk of getting burned.  Try a gentle neck and shoulder massage to help relieve symptoms. Activity  Rest as needed.  Return to your normal activities as told by your health care provider. Ask your health care provider what activities are safe for you.  Do stretching and strengthening exercises as told by your health care provider or physical therapist.  Do not lift anything that is heavier than 10 lb (4.5 kg) until your health care provider tells you that it is safe. General instructions  Use a flat pillow when you sleep.  Do not drive while wearing a cervical collar. If you do not have a cervical collar, ask your health care provider if it is safe to drive while your neck heals.  Ask your health care provider if the medicine prescribed  to you requires you to avoid driving or using heavy machinery.  Do not use any products that contain nicotine or tobacco, such as cigarettes, e-cigarettes, and chewing tobacco. These can delay healing. If you need help quitting, ask your health care provider.  Keep all follow-up visits as told by your health care provider. This is important. Contact a health care provider if:  Your condition does not improve with treatment. Get help right away if:  Your pain gets much worse and cannot be controlled with medicines.  You have weakness or numbness in your hand, arm, face, or leg.  You have a high fever.  You have a stiff, rigid neck.  You lose control of your bowels or your bladder (have incontinence).  You have trouble with walking, balance, or speaking. Summary  Cervical radiculopathy happens when a nerve in the neck is pinched or bruised.  A nerve can get pinched from a bulging disk, arthritis, muscle spasms, or an injury to the neck.  Symptoms include pain, tingling, or numbness radiating from the neck into the arm or hand. Weakness can also occur in severe cases.  Treatment may include rest, wearing a cervical collar, and physical therapy. Medicines may be prescribed to help with pain. In severe cases, injections or surgery may be needed. This information is not intended to replace advice given to you by your health care provider. Make sure you discuss any questions you have with your health care provider. Document Revised: 12/29/2017 Document Reviewed: 12/29/2017 Elsevier Patient Education  2020 [employer].

## 2019-07-24 LAB — INSULIN, RANDOM: Insulin: 34.6 u[IU]/mL — ABNORMAL HIGH

## 2019-07-31 LAB — LIPID PANEL
Cholesterol: 254 mg/dL — ABNORMAL HIGH (ref <200)
HDL: 44 mg/dL (ref >=40)
LDL Cholesterol (Calc): 175 mg/dL (calc) — ABNORMAL HIGH
Non-HDL Cholesterol (Calc): 210 mg/dL (calc) — ABNORMAL HIGH (ref <130)
Total CHOL/HDL Ratio: 5.8 (calc) — ABNORMAL HIGH (ref <5.0)
Triglycerides: 192 mg/dL — ABNORMAL HIGH (ref <150)

## 2019-07-31 LAB — TSH: TSH: 1.42 mIU/L (ref 0.40–4.50)

## 2019-07-31 LAB — HEMOGLOBIN A1C
Hgb A1c MFr Bld: 6.7 % of total Hgb — ABNORMAL HIGH (ref <5.7)
Mean Plasma Glucose: 146 (calc)
eAG (mmol/L): 8.1 (calc)

## 2019-07-31 LAB — COMPLETE METABOLIC PANEL WITH GFR
AG Ratio: 1.7 (calc) (ref 1.0–2.5)
ALT: 22 U/L (ref 9–46)
AST: 17 U/L (ref 10–35)
Albumin: 4.5 g/dL (ref 3.6–5.1)
Alkaline phosphatase (APISO): 95 U/L (ref 35–144)
BUN: 16 mg/dL (ref 7–25)
CO2: 27 mmol/L (ref 20–32)
Calcium: 10 mg/dL (ref 8.6–10.3)
Chloride: 102 mmol/L (ref 98–110)
Creat: 0.93 mg/dL (ref 0.70–1.25)
GFR, Est African American: 102 mL/min/{1.73_m2} (ref >=60)
GFR, Est Non African American: 88 mL/min/{1.73_m2} (ref >=60)
Globulin: 2.6 g/dL (calc) (ref 1.9–3.7)
Glucose, Bld: 159 mg/dL — ABNORMAL HIGH (ref 65–99)
Potassium: 4.4 mmol/L (ref 3.5–5.3)
Sodium: 138 mmol/L (ref 135–146)
Total Bilirubin: 0.5 mg/dL (ref 0.2–1.2)
Total Protein: 7.1 g/dL (ref 6.1–8.1)

## 2019-07-31 LAB — CBC WITH DIFFERENTIAL/PLATELET
Absolute Monocytes: 442 cells/uL (ref 200–950)
Basophils Absolute: 20 cells/uL (ref 0–200)
Basophils Relative: 0.3 %
Eosinophils Absolute: 169 cells/uL (ref 15–500)
Eosinophils Relative: 2.6 %
HCT: 46.5 % (ref 38.5–50.0)
Hemoglobin: 15.4 g/dL (ref 13.2–17.1)
Lymphs Abs: 1781 cells/uL (ref 850–3900)
MCH: 28.9 pg (ref 27.0–33.0)
MCHC: 33.1 g/dL (ref 32.0–36.0)
MCV: 87.4 fL (ref 80.0–100.0)
MPV: 10.7 fL (ref 7.5–12.5)
Monocytes Relative: 6.8 %
Neutro Abs: 4089 cells/uL (ref 1500–7800)
Neutrophils Relative %: 62.9 %
Platelets: 311 10*3/uL (ref 140–400)
RBC: 5.32 10*6/uL (ref 4.20–5.80)
RDW: 12.1 % (ref 11.0–15.0)
Total Lymphocyte: 27.4 %
WBC: 6.5 10*3/uL (ref 3.8–10.8)

## 2019-07-31 LAB — FERRITIN: Ferritin: 75 ng/mL (ref 24–380)

## 2019-07-31 LAB — IRON, TOTAL/TOTAL IRON BINDING CAP
%SAT: 26 % (calc) (ref 20–48)
Iron: 120 ug/dL (ref 50–180)
TIBC: 459 mcg/dL (calc) — ABNORMAL HIGH (ref 250–425)

## 2019-07-31 LAB — VITAMIN D 25 HYDROXY (VIT D DEFICIENCY, FRACTURES): Vit D, 25-Hydroxy: 39 ng/mL (ref 30–100)

## 2019-07-31 LAB — MAGNESIUM: Magnesium: 1.8 mg/dL (ref 1.5–2.5)

## 2019-07-31 LAB — INSULIN, RANDOM

## 2019-07-31 LAB — VITAMIN B12: Vitamin B-12: 362 pg/mL (ref 200–1100)

## 2019-08-14 ENCOUNTER — Other Ambulatory Visit: Payer: Self-pay | Admitting: Physician Assistant

## 2019-09-06 ENCOUNTER — Other Ambulatory Visit: Payer: Self-pay

## 2019-09-11 NOTE — Progress Notes (Addendum)
COVID Vaccine Completed: No Date COVID Vaccine completed: COVID vaccine manufacturer: [employer]   PCP - Lucky Cowboy, MD Cardiologist - Dr. Verdis Prime. Last seen 2008.  Saw cardiology due father having MI  Chest x-ray - N/A EKG - 09-17-2019 in Epic Stress Test - 2008 ECHO -  Cardiac Cath - 2008   Sleep Study - N/A CPAP - N/A  Fasting Blood Sugar - N/A Checks Blood Sugar __N/a___ times a day  Blood Thinner Instructions:N/A Aspirin Instructions:N/A Last Dose:  Anesthesia review: Stop Bang  Score 5  Patient denies shortness of breath, fever, cough and chest pain at PAT appointment   Patient verbalized understanding of instructions that were given to them at the PAT appointment. Patient was also instructed that they will need to review over the PAT instructions again at home before surgery.

## 2019-09-12 NOTE — Patient Instructions (Addendum)
DUE TO COVID-19 ONLY ONE VISITOR ARE ALLOWED TO COME WITH YOU AND STAY IN THE WAITING ROOM ONLY DURING PRE OP AND PROCEDURE. THEN TWO VISITORS MAY VISIT WITH YOU IN YOUR PRIVATE ROOM DURING VISITING HOURS ONLY!! (10AM-8PM)   COVID SWAB TESTING MUST BE COMPLETED ON:    Friday, 09-20-19 @ 11:00 AM [facility] Entr[facility] [address] (Must self quarantine after testing. Follow instructions on handout.)              Your procedure is scheduled on: Tuesday, 09-24-19   Report to [facility] Main  Entrance   Report to admitting at 7:45 AM   Call this number if you have problems the morning of surgery [phone]   Do not eat food :After Midnight.   May have liquids until 6:45 AM  day of surgery  CLEAR LIQUID DIET  Foods Allowed                                                                     Foods Excluded  Water, Black Coffee and tea, regular and decaf             liquids that you cannot  Plain Jell-O in any flavor  (No red)                                    see through such as: Fruit ices (not with fruit pulp)                                            milk, soups, orange juice              Iced Popsicles (No red)                                      All solid food                                   Apple juices Sports drinks like Gatorade (No red) Lightly seasoned clear broth or consume(fat free) Sugar, honey syrup  Sample Menu Breakfast                                Lunch                                     Supper Cranberry juice                    Beef broth                            Chicken broth Jell-O  Grape juice                           Apple juice Coffee or tea                        Jell-O                                      Popsicle                                                 Coffee or tea                        Coffee or tea      Complete one G2 drink the morning of surgery at   6:45 AM the day of surgery.    Oral Hygiene is also important to reduce your risk of infection.                                     Remember - BRUSH YOUR TEETH THE MORNING OF SURGERY WITH YOUR REGULAR TOOTHPASTE   Do NOT smoke after Midnight   Take these medicines the morning of surgery with A SIP OF WATER: None              You may not have any metal on your body including, jewelry, and body piercings              Do not wear  lotions, powders, perfumes/cologne, or deodorant                          Men may shave face and neck.   Do not bring valuables to the hospital. [facility] IS NOT  RESPONSIBLE   FOR VALUABLES.   Contacts, dentures or bridgework may not be worn into surgery.   Bring small overnight bag day of surgery.      Special Instructions: Bring a copy of your healthcare power of attorney and living will documents the day of surgery if you haven't scanned them in before.              Please read over the following fact sheets you were given: IF YOU HAVE QUESTIONS ABOUT YOUR PRE OP INSTRUCTIONS PLEASE CALL [phone]     [facility] - Preparing for Surgery Before surgery, you can play an important role.  Because skin is not sterile, your skin needs to be as free of germs as possible.  You can reduce the number of germs on your skin by washing with CHG (chlorahexidine gluconate) soap before surgery.  CHG is an antiseptic cleaner which kills germs and bonds with the skin to continue killing germs even after washing. Please DO NOT use if you have an allergy to CHG or antibacterial soaps.  If your skin becomes reddened/irritated stop using the CHG and inform your nurse when you arrive at Short Stay. Do not shave (including legs and underarms) for at least 48 hours prior to the first CHG shower.  You may shave your face/neck.  Please follow these instructions carefully:  1.  Shower with CHG Soap the night before surgery and the  morning of surgery.  2.  If you choose to  wash your hair, wash your hair first as usual with your normal  shampoo.  3.  After you shampoo, rinse your hair and body thoroughly to remove the shampoo.                             4.  Use CHG as you would any other liquid soap.  You can apply chg directly to the skin and wash.  Gently with a scrungie or clean washcloth.  5.  Apply the CHG Soap to your body ONLY FROM THE NECK DOWN.   Do   not use on face/ open                           Wound or open sores. Avoid contact with eyes, ears mouth and   genitals (private parts).                       Wash face,  Genitals (private parts) with your normal soap.             6.  Wash thoroughly, paying special attention to the area where your    surgery  will be performed.  7.  Thoroughly rinse your body with warm water from the neck down.  8.  DO NOT shower/wash with your normal soap after using and rinsing off the CHG Soap.                9.  Pat yourself dry with a clean towel.            10.  Wear clean pajamas.            11.  Place clean sheets on your bed the night of your first shower and do not  sleep with pets. Day of Surgery : Do not apply any lotions/deodorants the morning of surgery.  Please wear clean clothes to the hospital/surgery center.  FAILURE TO FOLLOW THESE INSTRUCTIONS MAY RESULT IN THE CANCELLATION OF YOUR SURGERY  PATIENT SIGNATURE_________________________________  NURSE SIGNATURE__________________________________  ________________________________________________________________________   Rogelia Mire  An incentive spirometer is a tool that can help keep your lungs clear and active. This tool measures how well you are filling your lungs with each breath. Taking long deep breaths may help reverse or decrease the chance of developing breathing (pulmonary) problems (especially infection) following:  A long period of time when you are unable to move or be active. BEFORE THE PROCEDURE   If the spirometer includes  an indicator to show your best effort, your nurse or respiratory therapist will set it to a desired goal.  If possible, sit up straight or lean slightly forward. Try not to slouch.  Hold the incentive spirometer in an upright position. INSTRUCTIONS FOR USE  1. Sit on the edge of your bed if possible, or sit up as far as you can in bed or on a chair. 2. Hold the incentive spirometer in an upright position. 3. Breathe out normally. 4. Place the mouthpiece in your mouth and seal your lips tightly around it. 5. Breathe in slowly and as deeply as possible, raising the piston or the ball toward the top of the column. 6. Hold your breath for 3-5 seconds or  for as long as possible. Allow the piston or ball to fall to the bottom of the column. 7. Remove the mouthpiece from your mouth and breathe out normally. 8. Rest for a few seconds and repeat Steps 1 through 7 at least 10 times every 1-2 hours when you are awake. Take your time and take a few normal breaths between deep breaths. 9. The spirometer may include an indicator to show your best effort. Use the indicator as a goal to work toward during each repetition. 10. After each set of 10 deep breaths, practice coughing to be sure your lungs are clear. If you have an incision (the cut made at the time of surgery), support your incision when coughing by placing a pillow or rolled up towels firmly against it. Once you are able to get out of bed, walk around indoors and cough well. You may stop using the incentive spirometer when instructed by your caregiver.  RISKS AND COMPLICATIONS  Take your time so you do not get dizzy or light-headed.  If you are in pain, you may need to take or ask for pain medication before doing incentive spirometry. It is harder to take a deep breath if you are having pain. AFTER USE  Rest and breathe slowly and easily.  It can be helpful to keep track of a log of your progress. Your caregiver can provide you with a simple  table to help with this. If you are using the spirometer at home, follow these instructions: SEEK MEDICAL CARE IF:   You are having difficultly using the spirometer.  You have trouble using the spirometer as often as instructed.  Your pain medication is not giving enough relief while using the spirometer.  You develop fever of 100.5 F (38.1 C) or higher. SEEK IMMEDIATE MEDICAL CARE IF:   You cough up bloody sputum that had not been present before.  You develop fever of 102 F (38.9 C) or greater.  You develop worsening pain at or near the incision site. MAKE SURE YOU:   Understand these instructions.  Will watch your condition.  Will get help right away if you are not doing well or get worse. Document Released: 06/20/2006 Document Revised: 05/02/2011 Document Reviewed: 08/21/2006 [facility] Patient Information 2014 [place], [place].   ________________________________________________________________________

## 2019-09-16 ENCOUNTER — Telehealth: Payer: Self-pay | Admitting: Adult Health

## 2019-09-16 NOTE — Telephone Encounter (Signed)
patinet states he spoke with Dr Nolon Nations office, Ortho, He does not require surgical clearance.  The form sent to PCP was a notice of upcoming procedure

## 2019-09-16 NOTE — Telephone Encounter (Signed)
Called patient  to confirm appointment for surgical clearance. Patient states:  that labs have been ordered by Dr Nolon Nations office at [facility] Pre Op 09/17/19. Patient left message for surgeon- is Pre Surgical Clearance needed if not being put to sleep. Will call back w/ response to confirm or cancel our appointment.

## 2019-09-17 ENCOUNTER — Encounter (HOSPITAL_COMMUNITY): Payer: Self-pay

## 2019-09-17 ENCOUNTER — Ambulatory Visit: Payer: 59 | Admitting: Adult Health

## 2019-09-17 ENCOUNTER — Other Ambulatory Visit: Payer: Self-pay

## 2019-09-17 ENCOUNTER — Encounter (HOSPITAL_COMMUNITY)
Admission: RE | Admit: 2019-09-17 | Discharge: 2019-09-17 | Disposition: A | Discharge status: Home / Self Care | Payer: 59 | Source: Ambulatory Visit | Attending: Orthopaedic Surgery | Admitting: Orthopaedic Surgery

## 2019-09-17 DIAGNOSIS — Z01818 Encounter for other preprocedural examination: Secondary | ICD-10-CM | POA: Insufficient documentation

## 2019-09-17 DIAGNOSIS — I10 Essential (primary) hypertension: Secondary | ICD-10-CM | POA: Insufficient documentation

## 2019-09-17 HISTORY — DX: Prediabetes: R73.03

## 2019-09-17 HISTORY — DX: Personal history of urinary calculi: Z87.442

## 2019-09-17 LAB — BASIC METABOLIC PANEL
Anion gap: 9 (ref 5–15)
BUN: 17 mg/dL (ref 8–23)
CO2: 27 mmol/L (ref 22–32)
Calcium: 9.3 mg/dL (ref 8.9–10.3)
Chloride: 105 mmol/L (ref 98–111)
Creatinine, Ser: 0.92 mg/dL (ref 0.61–1.24)
GFR calc Af Amer: >60 mL/min (ref >60)
GFR calc non Af Amer: >60 mL/min (ref >60)
Glucose, Bld: 174 mg/dL — ABNORMAL HIGH (ref 70–99)
Potassium: 4.6 mmol/L (ref 3.5–5.1)
Sodium: 141 mmol/L (ref 135–145)

## 2019-09-17 LAB — CBC
HCT: 45.5 % (ref 39.0–52.0)
Hemoglobin: 15 g/dL (ref 13.0–17.0)
MCH: 28.7 pg (ref 26.0–34.0)
MCHC: 33 g/dL (ref 30.0–36.0)
MCV: 87 fL (ref 80.0–100.0)
Platelets: 305 10*3/uL (ref 150–400)
RBC: 5.23 MIL/uL (ref 4.22–5.81)
RDW: 12.5 % (ref 11.5–15.5)
WBC: 6.6 10*3/uL (ref 4.0–10.5)
nRBC: 0 % (ref 0.0–0.2)

## 2019-09-17 LAB — HEMOGLOBIN A1C
Hgb A1c MFr Bld: 7.4 % — ABNORMAL HIGH (ref 4.8–5.6)
Mean Plasma Glucose: 165.68 mg/dL

## 2019-09-17 LAB — GLUCOSE, CAPILLARY: Glucose-Capillary: 172 mg/dL — ABNORMAL HIGH (ref 70–99)

## 2019-09-17 NOTE — Progress Notes (Signed)
   09/17/19 0929  OBSTRUCTIVE SLEEP APNEA  Have you ever been diagnosed with sleep apnea through a sleep study? No  Do you snore loudly (loud enough to be heard through closed doors)?  0  Do you often feel tired, fatigued, or sleepy during the daytime (such as falling asleep during driving or talking to someone)? 0  Has anyone observed you stop breathing during your sleep? 0  Do you have, or are you being treated for high blood pressure? 1  BMI more than 35 kg/m2? 1  Age > 50 (1-yes) 1  Neck circumference greater than:Male 16 inches or larger, Male 17inches or larger? 1  Male Gender (Yes=1) 1  Obstructive Sleep Apnea Score 5  Score 5 or greater  Results sent to PCP

## 2019-09-18 ENCOUNTER — Encounter: Payer: Self-pay | Admitting: Internal Medicine

## 2019-09-20 ENCOUNTER — Other Ambulatory Visit: Payer: Self-pay

## 2019-09-20 ENCOUNTER — Other Ambulatory Visit
Admission: RE | Admit: 2019-09-20 | Discharge: 2019-09-20 | Disposition: A | Discharge status: Home / Self Care | Payer: 59 | Source: Ambulatory Visit | Attending: Orthopaedic Surgery | Admitting: Orthopaedic Surgery

## 2019-09-20 DIAGNOSIS — Z20822 Contact with and (suspected) exposure to covid-19: Secondary | ICD-10-CM | POA: Insufficient documentation

## 2019-09-20 DIAGNOSIS — Z01812 Encounter for preprocedural laboratory examination: Secondary | ICD-10-CM | POA: Diagnosis present

## 2019-09-20 LAB — SARS CORONAVIRUS 2 (TAT 6-24 HRS): SARS Coronavirus 2: NEGATIVE

## 2019-09-23 NOTE — H&P (Signed)
John Andrade is an 64 y.o. male.   Chief Complaint: right hand pain HPI: John Andrade continues with triggering of his right thumb and the near constant numbness in both hands.  We know from some nerve conduction studies back in 2019 that he has moderate carpal tunnel syndrome on both sides.  He says he has trouble using his hands and would like something done to improve his situation.   Past Medical History:  Diagnosis Date  . DDD lumbar   . DJD (degenerative joint disease)   . GERD (gastroesophageal reflux disease)    takes Omeprazole daily  . History of kidney stones   . Hyperlipidemia   . Hypertension    takes Lisinopril and Amlodipine daily  . Multiple allergies    takes Claritin and Singulair daily  . PONV (postoperative nausea and vomiting)   . Pre-diabetes    Not on any medications  . Vertigo     Past Surgical History:  Procedure Laterality Date  . ANKLE FRACTURE SURGERY  1995   right  . BACK SURGERY  2002   lumb lam-fusion  . BACK SURGERY  03-2014   Dr John Andrade in [place]  . CARDIAC CATHETERIZATION     2007  . CHONDROPLASTY Left 10/28/2014   Procedure: CHONDROPLASTY;  Surgeon: John Corning, MD;  Location: [facility];  Service: Orthopedics;  Laterality: Left;  . COLONOSCOPY    . ELBOW LIGAMENT RECONSTRUCTION Right 2015   [facility]  . KNEE ARTHROSCOPY Left 04/24/2012   Procedure: ARTHROSCOPY KNEE, PARTIAL MEDIAL MENISECTOMY AND CHONDROPLASTY;  Surgeon: John Ochs, MD;  Location: [facility];  Service: Orthopedics;  Laterality: Left;  . KNEE ARTHROSCOPY WITH MEDIAL MENISECTOMY Left 10/28/2014   Procedure: LEFT ARTHROSCOPY KNEE, PARTIAL MEDIAL MENISECTOMY, CHONDROPLASTY;  Surgeon: John Corning, MD;  Location: [facility];  Service: Orthopedics;  Laterality: Left;  . TOTAL HIP ARTHROPLASTY Right 09/22/2015   Procedure: TOTAL HIP ARTHROPLASTY ANTERIOR APPROACH;  Surgeon: John Corning, MD;  Location: [facility] OR;  Service: Orthopedics;  Laterality:  Right;  . TOTAL HIP ARTHROPLASTY Left 01/05/2016   Procedure: LEFT TOTAL HIP ARTHROPLASTY ANTERIOR APPROACH;  Surgeon: John Corning, MD;  Location: [facility] OR;  Service: Orthopedics;  Laterality: Left;  . WISDOM TOOTH EXTRACTION      Family History  Problem Relation Age of Onset  . Hypertension Mother   . Dementia Mother   . Cancer Father   . Hypertension Father   . Heart disease Father   . Hyperlipidemia Father   . Diabetes Sister   . Cancer Maternal Grandmother        lung   Social History:  reports that he quit smoking about 44 years ago. He has never used smokeless tobacco. He reports that he does not drink alcohol and does not use drugs.  Allergies:  Allergies  Allergen Reactions  . Statins Other (See Comments)    Flu-like symptoms  . Tylenol [Acetaminophen]     Liver enzymes elevated so patient has stopped taking anything with acetaminophen in it  . Phentermine Other (See Comments)    constipation    No medications prior to admission.    No results found for this or any previous visit (from the past 48 hour(s)). No results found.  Review of Systems  Musculoskeletal: Positive for arthralgias.       Right hand  All other systems reviewed and are negative.   There were no vitals taken for this visit. Physical Exam Constitutional:  Appearance: Normal appearance.  HENT:     Head: Normocephalic and atraumatic.     Mouth/Throat:     Pharynx: Oropharynx is clear.  Eyes:     Extraocular Movements: Extraocular movements intact.  Cardiovascular:     Rate and Rhythm: Normal rate and regular rhythm.     Pulses: Normal pulses.  Pulmonary:     Effort: Pulmonary effort is normal.  Abdominal:     Palpations: Abdomen is soft.  Musculoskeletal:     Cervical back: Normal range of motion and neck supple.     Comments: Cervical motion remains good.  He has good motion of the right shoulder with just mild impingement pain.  He has subjective numbness in median  distribution in both hands but no thenar atrophy that I can appreciate.  He has active triggering of his right thumb.  He has good refill the tips of his fingers.    Skin:    General: Skin is warm and dry.  Neurological:     General: No focal deficit present.     Mental Status: He is alert and oriented to person, place, and time. Mental status is at baseline.  Psychiatric:        Mood and Affect: Mood normal.        Behavior: Behavior normal.        Thought Content: Thought content normal.        Judgment: Judgment normal.      Assessment/Plan 1.  Right trigger thumb injected 08/14/19 2.  Bilateral moderate carpal tunnel syndrome by [facility] 2019  Plan: John Andrade would like his hands addressed.  I think we can help him with carpal tunnel release really on both sides.  We are going to start with right-sided surgery and add a trigger thumb release at the same sitting.  I reviewed risk of anesthesia, infection, neurovascular injury.  The plan will be for some sedation and local anesthetic at both sites.  John Soules Mikell Camp, PA-C 09/23/2019, 2:52 PM

## 2019-09-23 NOTE — Anesthesia Preprocedure Evaluation (Addendum)
Anesthesia Evaluation  Patient identified by MRN, date of birth, ID band Patient awake    Reviewed: Allergy & Precautions, NPO status , Patient's Chart, lab work & pertinent test results  History of Anesthesia Complications (+) PONV and history of anesthetic complications  Airway Mallampati: II  TM Distance: >3 FB Neck ROM: Full    Dental no notable dental hx.    Pulmonary neg pulmonary ROS, former smoker,    Pulmonary exam normal        Cardiovascular hypertension, Pt. on medications Normal cardiovascular exam     Neuro/Psych negative neurological ROS  negative psych ROS   GI/Hepatic Neg liver ROS, GERD  Medicated and Controlled,  Endo/Other  diabetes, Type 2Morbid obesity  Renal/GU negative Renal ROS  negative genitourinary   Musculoskeletal negative musculoskeletal ROS (+)   Abdominal   Peds  Hematology negative hematology ROS (+)   Anesthesia Other Findings Day of surgery medications reviewed with patient.  Reproductive/Obstetrics negative OB ROS                            Anesthesia Physical Anesthesia Plan  ASA: III  Anesthesia Plan: MAC   Post-op Pain Management:    Induction:   PONV Risk Score and Plan: 2 and Treatment may vary due to age or medical condition, Propofol infusion, Ondansetron and Midazolam  Airway Management Planned: Natural Airway and Simple Face Mask  Additional Equipment: None  Intra-op Plan:   Post-operative Plan:   Informed Consent: I have reviewed the patients History and Physical, chart, labs and discussed the procedure including the risks, benefits and alternatives for the proposed anesthesia with the patient or authorized representative who has indicated his/her understanding and acceptance.       Plan Discussed with: CRNA  Anesthesia Plan Comments:        Anesthesia Quick Evaluation

## 2019-09-24 ENCOUNTER — Ambulatory Visit (HOSPITAL_COMMUNITY)
Admission: RE | Admit: 2019-09-24 | Discharge: 2019-09-24 | Disposition: A | Discharge status: Home / Self Care | Payer: 59 | Attending: Orthopaedic Surgery | Admitting: Orthopaedic Surgery

## 2019-09-24 ENCOUNTER — Ambulatory Visit (HOSPITAL_COMMUNITY): Payer: 59 | Admitting: Physician Assistant

## 2019-09-24 ENCOUNTER — Ambulatory Visit (HOSPITAL_COMMUNITY): Payer: 59 | Admitting: Registered Nurse

## 2019-09-24 ENCOUNTER — Encounter (HOSPITAL_COMMUNITY)
Admission: RE | Disposition: A | Discharge status: Home / Self Care | Payer: Self-pay | Source: Home / Self Care | Attending: Orthopaedic Surgery

## 2019-09-24 DIAGNOSIS — Z809 Family history of malignant neoplasm, unspecified: Secondary | ICD-10-CM | POA: Insufficient documentation

## 2019-09-24 DIAGNOSIS — Z888 Allergy status to other drugs, medicaments and biological substances status: Secondary | ICD-10-CM | POA: Diagnosis not present

## 2019-09-24 DIAGNOSIS — Z8349 Family history of other endocrine, nutritional and metabolic diseases: Secondary | ICD-10-CM | POA: Insufficient documentation

## 2019-09-24 DIAGNOSIS — Z833 Family history of diabetes mellitus: Secondary | ICD-10-CM | POA: Insufficient documentation

## 2019-09-24 DIAGNOSIS — M1990 Unspecified osteoarthritis, unspecified site: Secondary | ICD-10-CM | POA: Diagnosis not present

## 2019-09-24 DIAGNOSIS — Z8249 Family history of ischemic heart disease and other diseases of the circulatory system: Secondary | ICD-10-CM | POA: Diagnosis not present

## 2019-09-24 DIAGNOSIS — Z801 Family history of malignant neoplasm of trachea, bronchus and lung: Secondary | ICD-10-CM | POA: Insufficient documentation

## 2019-09-24 DIAGNOSIS — Z820 Family history of epilepsy and other diseases of the nervous system: Secondary | ICD-10-CM | POA: Insufficient documentation

## 2019-09-24 DIAGNOSIS — Z87891 Personal history of nicotine dependence: Secondary | ICD-10-CM | POA: Insufficient documentation

## 2019-09-24 DIAGNOSIS — M65311 Trigger thumb, right thumb: Secondary | ICD-10-CM | POA: Diagnosis present

## 2019-09-24 DIAGNOSIS — Z87442 Personal history of urinary calculi: Secondary | ICD-10-CM | POA: Diagnosis not present

## 2019-09-24 DIAGNOSIS — Z96643 Presence of artificial hip joint, bilateral: Secondary | ICD-10-CM | POA: Insufficient documentation

## 2019-09-24 DIAGNOSIS — Z6841 Body Mass Index (BMI) 40.0 and over, adult: Secondary | ICD-10-CM | POA: Insufficient documentation

## 2019-09-24 DIAGNOSIS — I10 Essential (primary) hypertension: Secondary | ICD-10-CM | POA: Diagnosis not present

## 2019-09-24 DIAGNOSIS — E119 Type 2 diabetes mellitus without complications: Secondary | ICD-10-CM | POA: Insufficient documentation

## 2019-09-24 DIAGNOSIS — Z886 Allergy status to analgesic agent status: Secondary | ICD-10-CM | POA: Insufficient documentation

## 2019-09-24 DIAGNOSIS — E785 Hyperlipidemia, unspecified: Secondary | ICD-10-CM | POA: Insufficient documentation

## 2019-09-24 DIAGNOSIS — G5603 Carpal tunnel syndrome, bilateral upper limbs: Secondary | ICD-10-CM | POA: Insufficient documentation

## 2019-09-24 DIAGNOSIS — K219 Gastro-esophageal reflux disease without esophagitis: Secondary | ICD-10-CM | POA: Insufficient documentation

## 2019-09-24 HISTORY — PX: CARPAL TUNNEL RELEASE: SHX101

## 2019-09-24 HISTORY — PX: TRIGGER FINGER RELEASE: SHX641

## 2019-09-24 LAB — GLUCOSE, CAPILLARY
Glucose-Capillary: 161 mg/dL — ABNORMAL HIGH (ref 70–99)
Glucose-Capillary: 161 mg/dL — ABNORMAL HIGH (ref 70–99)

## 2019-09-24 SURGERY — CARPAL TUNNEL RELEASE
Anesthesia: Monitor Anesthesia Care | Laterality: Right

## 2019-09-24 MED ORDER — LACTATED RINGERS IV SOLN: INTRAVENOUS | Status: DC

## 2019-09-24 MED ORDER — MIDAZOLAM HCL 2 MG/2ML IJ SOLN
INTRAMUSCULAR | Status: AC
  Filled 2019-09-24: qty 2

## 2019-09-24 MED ORDER — HYDROCODONE-ACETAMINOPHEN 5-325 MG PO TABS: 1.0000 | ORAL_TABLET | Freq: Four times a day (QID) | ORAL | 0 refills | Status: DC | PRN

## 2019-09-24 MED ORDER — CHLORHEXIDINE GLUCONATE 0.12 % MT SOLN
15.0000 mL | Freq: Once | OROMUCOSAL | Status: AC
  Administered 2019-09-24: 15 mL via OROMUCOSAL

## 2019-09-24 MED ORDER — FENTANYL CITRATE (PF) 100 MCG/2ML IJ SOLN
INTRAMUSCULAR | Status: DC | PRN
  Administered 2019-09-24 (×2): 50 ug via INTRAVENOUS

## 2019-09-24 MED ORDER — ONDANSETRON HCL 4 MG/2ML IJ SOLN
INTRAMUSCULAR | Status: DC | PRN
  Administered 2019-09-24: 4 mg via INTRAVENOUS

## 2019-09-24 MED ORDER — FENTANYL CITRATE (PF) 100 MCG/2ML IJ SOLN: 25.0000 ug | INTRAMUSCULAR | Status: DC | PRN

## 2019-09-24 MED ORDER — ONDANSETRON HCL 4 MG/2ML IJ SOLN
INTRAMUSCULAR | Status: AC
  Filled 2019-09-24: qty 2

## 2019-09-24 MED ORDER — OXYCODONE HCL 5 MG/5ML PO SOLN: 5.0000 mg | Freq: Once | ORAL | Status: DC | PRN

## 2019-09-24 MED ORDER — FENTANYL CITRATE (PF) 100 MCG/2ML IJ SOLN
INTRAMUSCULAR | Status: AC
  Filled 2019-09-24: qty 2

## 2019-09-24 MED ORDER — BUPIVACAINE HCL (PF) 0.25 % IJ SOLN
INTRAMUSCULAR | Status: AC
  Filled 2019-09-24: qty 30

## 2019-09-24 MED ORDER — PROPOFOL 500 MG/50ML IV EMUL
INTRAVENOUS | Status: DC | PRN
  Administered 2019-09-24: 30 mg via INTRAVENOUS
  Administered 2019-09-24: 20 mg via INTRAVENOUS
  Administered 2019-09-24: 30 mg via INTRAVENOUS

## 2019-09-24 MED ORDER — PROPOFOL 10 MG/ML IV BOLUS
INTRAVENOUS | Status: AC
  Filled 2019-09-24: qty 20

## 2019-09-24 MED ORDER — ACETAMINOPHEN 500 MG PO TABS
1000.0000 mg | ORAL_TABLET | Freq: Once | ORAL | Status: AC
  Administered 2019-09-24: 1000 mg via ORAL
  Filled 2019-09-24: qty 2

## 2019-09-24 MED ORDER — LIDOCAINE-EPINEPHRINE (PF) 1 %-1:200000 IJ SOLN
INTRAMUSCULAR | Status: AC
  Filled 2019-09-24: qty 30

## 2019-09-24 MED ORDER — CEFAZOLIN SODIUM-DEXTROSE 2-4 GM/100ML-% IV SOLN
2.0000 g | INTRAVENOUS | Status: AC
  Administered 2019-09-24: 2 g via INTRAVENOUS
  Filled 2019-09-24: qty 100

## 2019-09-24 MED ORDER — PHENYLEPHRINE 40 MCG/ML (10ML) SYRINGE FOR IV PUSH (FOR BLOOD PRESSURE SUPPORT)
PREFILLED_SYRINGE | INTRAVENOUS | Status: AC
  Filled 2019-09-24: qty 10

## 2019-09-24 MED ORDER — LIDOCAINE HCL (PF) 1 % IJ SOLN
INTRAMUSCULAR | Status: DC | PRN
  Administered 2019-09-24: 5 mL

## 2019-09-24 MED ORDER — MIDAZOLAM HCL 5 MG/5ML IJ SOLN
INTRAMUSCULAR | Status: DC | PRN
  Administered 2019-09-24 (×2): 1 mg via INTRAVENOUS

## 2019-09-24 MED ORDER — ORAL CARE MOUTH RINSE: 15.0000 mL | Freq: Once | OROMUCOSAL | Status: AC

## 2019-09-24 MED ORDER — BUPIVACAINE HCL (PF) 0.25 % IJ SOLN
INTRAMUSCULAR | Status: DC | PRN
  Administered 2019-09-24: 5 mL

## 2019-09-24 MED ORDER — DEXAMETHASONE SODIUM PHOSPHATE 10 MG/ML IJ SOLN
INTRAMUSCULAR | Status: AC
  Filled 2019-09-24: qty 1

## 2019-09-24 MED ORDER — PROPOFOL 500 MG/50ML IV EMUL
INTRAVENOUS | Status: DC | PRN
  Administered 2019-09-24: 50 ug/kg/min via INTRAVENOUS

## 2019-09-24 MED ORDER — OXYCODONE HCL 5 MG PO TABS: 5.0000 mg | ORAL_TABLET | Freq: Once | ORAL | Status: DC | PRN

## 2019-09-24 MED ORDER — PROMETHAZINE HCL 25 MG/ML IJ SOLN: 6.2500 mg | INTRAMUSCULAR | Status: DC | PRN

## 2019-09-24 MED ORDER — 0.9 % SODIUM CHLORIDE (POUR BTL) OPTIME
TOPICAL | Status: DC | PRN
  Administered 2019-09-24: 1000 mL

## 2019-09-24 SURGICAL SUPPLY — 48 items
BLADE MINI RND TIP GREEN BEAV (BLADE) IMPLANT
BLADE SURG 15 STRL LF DISP TIS (BLADE) ×1 IMPLANT
BLADE SURG 15 STRL SS (BLADE) ×2
BNDG CMPR 9X4 STRL LF SNTH (GAUZE/BANDAGES/DRESSINGS) ×1
BNDG ELASTIC 2X5.8 VLCR STR LF (GAUZE/BANDAGES/DRESSINGS) IMPLANT
BNDG ELASTIC 3X5.8 VLCR STR LF (GAUZE/BANDAGES/DRESSINGS) ×2 IMPLANT
BNDG ELASTIC 4X5.8 VLCR STR LF (GAUZE/BANDAGES/DRESSINGS) ×2 IMPLANT
BNDG ESMARK 4X9 LF (GAUZE/BANDAGES/DRESSINGS) ×2 IMPLANT
BNDG GAUZE ELAST 4 BULKY (GAUZE/BANDAGES/DRESSINGS) ×2 IMPLANT
BNDG PLASTER X FAST 3X3 WHT LF (CAST SUPPLIES) IMPLANT
BNDG PLSTR 9X3 FST ST WHT (CAST SUPPLIES)
CORD BIPOLAR FORCEPS 12FT (ELECTRODE) ×2 IMPLANT
COVER BACK TABLE 60X90IN (DRAPES) ×2 IMPLANT
COVER MAYO STAND STRL (DRAPES) ×2 IMPLANT
COVER WAND RF STERILE (DRAPES) IMPLANT
CUFF TOURN SGL QUICK 18X4 (TOURNIQUET CUFF) IMPLANT
DRAPE EXTREMITY T 121X128X90 (DISPOSABLE) ×2 IMPLANT
DRAPE SURG 17X23 STRL (DRAPES) ×2 IMPLANT
DRAPE U-SHAPE 47X51 STRL (DRAPES) ×2 IMPLANT
DRSG EMULSION OIL 3X3 NADH (GAUZE/BANDAGES/DRESSINGS) ×2 IMPLANT
DRSG PAD ABDOMINAL 8X10 ST (GAUZE/BANDAGES/DRESSINGS) ×2 IMPLANT
DURAPREP 26ML APPLICATOR (WOUND CARE) ×2 IMPLANT
ELECT NEEDLE TIP 2.8 STRL (NEEDLE) IMPLANT
ELECT REM PT RETURN 15FT ADLT (MISCELLANEOUS) ×2 IMPLANT
GAUZE SPONGE 4X4 12PLY STRL (GAUZE/BANDAGES/DRESSINGS) ×2 IMPLANT
GAUZE XEROFORM 1X8 LF (GAUZE/BANDAGES/DRESSINGS) ×2 IMPLANT
GLOVE BIO SURGEON STRL SZ8 (GLOVE) ×4 IMPLANT
GLOVE BIOGEL PI IND STRL 8 (GLOVE) ×2 IMPLANT
GLOVE BIOGEL PI INDICATOR 8 (GLOVE) ×2
GOWN STRL REUS W/ TWL XL LVL3 (GOWN DISPOSABLE) ×2 IMPLANT
GOWN STRL REUS W/TWL XL LVL3 (GOWN DISPOSABLE) ×4
KIT BASIN OR (CUSTOM PROCEDURE TRAY) ×2 IMPLANT
LOOP VESSEL MAXI BLUE (MISCELLANEOUS) IMPLANT
NEEDLE HYPO 25X1 1.5 SAFETY (NEEDLE) IMPLANT
NS IRRIG 1000ML POUR BTL (IV SOLUTION) ×2 IMPLANT
PAD CAST 4YDX4 CTTN HI CHSV (CAST SUPPLIES) ×1 IMPLANT
PADDING CAST ABS 4INX4YD NS (CAST SUPPLIES)
PADDING CAST ABS COTTON 4X4 ST (CAST SUPPLIES) IMPLANT
PADDING CAST COTTON 4X4 STRL (CAST SUPPLIES) ×2
PENCIL SMOKE EVACUATOR (MISCELLANEOUS) IMPLANT
STOCKINETTE 4X48 STRL (DRAPES) ×2 IMPLANT
SUT ETHILON 3 0 PS 1 (SUTURE) ×2 IMPLANT
SUT ETHILON 4 0 PS 2 18 (SUTURE) ×2 IMPLANT
SUT VIC AB 4-0 P-3 18XBRD (SUTURE) IMPLANT
SUT VIC AB 4-0 P3 18 (SUTURE)
SYR CONTROL 10ML LL (SYRINGE) IMPLANT
TOWEL OR 17X26 10 PK STRL BLUE (TOWEL DISPOSABLE) ×2 IMPLANT
UNDERPAD 30X36 HEAVY ABSORB (UNDERPADS AND DIAPERS) ×2 IMPLANT

## 2019-09-24 NOTE — Interval H&P Note (Signed)
History and Physical Interval Note:  09/24/2019 9:16 AM  John Andrade  has presented today for surgery, with the diagnosis of RIGHT CARPEL TUNNEL SYNDROME AND TRIGGER THUMB.  The various methods of treatment have been discussed with the patient and family. After consideration of risks, benefits and other options for treatment, the patient has consented to  Procedure(s): RIGHT CARPAL TUNNEL RELEASE (Right) RIGHT RELEASE TRIGGER FINGER/A-1 PULLEY (Right) as a surgical intervention.  The patient's history has been reviewed, patient examined, no change in status, stable for surgery.  I have reviewed the patient's chart and labs.  Questions were answered to the patient's satisfaction.     [name]

## 2019-09-24 NOTE — Brief Op Note (Signed)
09/24/2019  10:55 AM John Andrade [id number] 09/24/2019   PRE-OP DIAGNOSIS: right CTS and TT  POST-OP DIAGNOSIS: same  PROCEDURE: right CTR and TT release  ANESTHESIA: local and [place]   Dictation #:  [id number]

## 2019-09-24 NOTE — Op Note (Signed)
NAME: John Andrade, FELKINS MEDICAL RECORD [id number] ACCOUNT [id number] DATE OF BIRTH:09/09/1955 FACILITY: [facility] LOCATION: [facility] PHYSICIAN:Beverlyn Mcginness Autumn Patty, MD  OPERATIVE REPORT  DATE OF PROCEDURE:  09/24/2019  PREOPERATIVE DIAGNOSES: 1.  Right carpal tunnel syndrome. 2.  Right trigger thumb.  POSTOPERATIVE DIAGNOSES: 1.  Right carpal tunnel syndrome. 2.  Right trigger thumb.  PROCEDURES: 1.  Right carpal tunnel release. 2.  Right trigger thumb release.  ANESTHESIA:  Local and MAC.  ATTENDING SURGEON:  Melrose Nakayama, MD   ASSISTANT:  Loni Dolly, PA.  INDICATIONS:  The patient is a 64 year old man with a long history of bilateral hand numbness and triggering of his right thumb.  This has persisted despite various nonoperative measures, including long-term splinting, anti-inflammatories, and  injections.  By nerve conduction study, he has severe carpal tunnel syndrome.  At this point, he is offered release of the carpal tunnel and trigger thumb at the same sitting.  Informed operative consent was obtained after discussion of possible  complications, including reaction to anesthesia, infection, and neurovascular injury.  SUMMARY OF FINDINGS AND PROCEDURE:  Under some sedation and local anesthetic, we performed releases of both the right carpal tunnel and the right A1 pulley.  He had very thick transverse carpal ligament at the carpal tunnel with significant compression  of the median nerve, which we released.  He also had a thick A1 pulley, which we released.  He was closed primarily and scheduled to go home the same day.  DESCRIPTION OF PROCEDURE:  The patient was taken to an operating suite where some sedation was applied.  He was then given some local anesthetic in the areas of our intended incisions.  We then prepped and draped in normal sterile fashion.  After the  administration of preoperative IV Kefzol and appropriate time-out, the right arm was elevated, exsanguinated, and  a tourniquet inflated about the forearm.  We first addressed the trigger thumb.  I made a small transverse incision at the level of the MCP  flexion crease.  Dissection was carried down to the flexor tendon sheath.  I incised an area of this sheath consistent with the A1 pulley.  This seemed very stenotic and thick.  I then pulled the tendon up through the wound and I felt that we had  released fully.  We then made an incision at the carpal tunnel, which did not cross the wrist flexion crease.  Dissection was carried down through the palmar fascia to expose the transverse carpal ligament.  This was released under direct visualization.   This was moderately thickened.  He had significant compression of the underlying median nerve.  This was decompressed proximally through the distal fascia of the forearm and distally to the transverse arch vessels.  We then irrigated both wounds,  followed by reapproximation of the skin with vertical mattresses of nylon in both locations.  The tourniquet was deflated and fingertips became pink and warm immediately.  Xeroform was applied, followed by dry gauze and a volar splint of plaster with the  wrist in slight extension.  Estimated blood loss and fluids can be obtained from anesthesia records, as can accurate tourniquet time.  DISPOSITION:  The patient was taken to the recovery room in stable condition.  He was to go home same day and follow up in the office next week.  I will contact him by phone tonight.  VN/NUANCE  D:09/24/2019 T:09/24/2019 JOB:[id number]

## 2019-09-24 NOTE — Anesthesia Procedure Notes (Signed)
Procedure Name: MAC Date/Time: 09/24/2019 10:00 AM Performed by: West Pugh, CRNA Pre-anesthesia Checklist: Patient identified, Emergency Drugs available, Suction available, Patient being monitored and Timeout performed Patient Re-evaluated:Patient Re-evaluated prior to induction Oxygen Delivery Method: Simple face mask Preoxygenation: Pre-oxygenation with 100% oxygen Induction Type: IV induction Placement Confirmation: positive ETCO2 Dental Injury: Teeth and Oropharynx as per pre-operative assessment

## 2019-09-24 NOTE — Transfer of Care (Signed)
Immediate Anesthesia Transfer of Care Note  Patient: John Andrade  Procedure(s) Performed: RIGHT CARPAL TUNNEL RELEASE (Right ) RIGHT RELEASE TRIGGER FINGER/A-1 PULLEY (Right )  Patient Location: PACU  Anesthesia Type:MAC  Level of Consciousness: awake, alert  and patient cooperative  Airway & Oxygen Therapy: Patient Spontanous Breathing and Patient connected to face mask oxygen  Post-op Assessment: Report given to RN and Post -op Vital signs reviewed and stable  Post vital signs: Reviewed and stable  Last Vitals:  Vitals Value Taken Time  BP 143/99 09/24/19 1102  Temp    Pulse 54 09/24/19 1104  Resp 13 09/24/19 1104  SpO2 100 % 09/24/19 1104  Vitals shown include unvalidated device data.  Last Pain:  Vitals:   09/24/19 0755  TempSrc: Oral         Complications: No complications documented.

## 2019-09-24 NOTE — Anesthesia Postprocedure Evaluation (Signed)
Anesthesia Post Note  Patient: John Andrade  Procedure(s) Performed: RIGHT CARPAL TUNNEL RELEASE (Right ) RIGHT RELEASE TRIGGER FINGER/A-1 PULLEY (Right )     Patient location during evaluation: PACU Anesthesia Type: MAC Level of consciousness: awake and alert and oriented Pain management: pain level controlled Vital Signs Assessment: post-procedure vital signs reviewed and stable Respiratory status: spontaneous breathing, nonlabored ventilation and respiratory function stable Cardiovascular status: blood pressure returned to baseline Postop Assessment: no apparent nausea or vomiting Anesthetic complications: no   No complications documented.  Last Vitals:  Vitals:   09/24/19 0755 09/24/19 1102  BP: (!) 167/98 (!) 143/99  Pulse: 71 (!) 58  Resp: 16 12  Temp: 36.9 C (!) 36.3 C  SpO2: 98% 100%    Last Pain:  Vitals:   09/24/19 1102  TempSrc:   PainSc: 0-No pain                 Brennan Bailey

## 2019-09-25 ENCOUNTER — Encounter (HOSPITAL_COMMUNITY): Payer: Self-pay | Admitting: Orthopaedic Surgery

## 2019-10-07 ENCOUNTER — Other Ambulatory Visit: Payer: Self-pay | Admitting: Orthopaedic Surgery

## 2019-10-15 DIAGNOSIS — R42 Dizziness and giddiness: Secondary | ICD-10-CM

## 2019-10-15 HISTORY — DX: Dizziness and giddiness: R42

## 2019-10-16 ENCOUNTER — Ambulatory Visit: Payer: 59 | Admitting: Physician Assistant

## 2019-10-17 NOTE — Patient Instructions (Signed)
DUE TO COVID-19 ONLY ONE VISITOR IS ALLOWED TO COME WITH YOU AND STAY IN THE WAITING ROOM ONLY DURING PRE OP AND PROCEDURE DAY OF SURGERY. THE 1 VISITOR  MAY VISIT WITH YOU AFTER SURGERY IN YOUR PRIVATE ROOM DURING VISITING HOURS ONLY!  YOU NEED TO HAVE A COVID 19 TEST ON__8/27_____ @_11 :30______, THIS TEST MUST BE DONE BEFORE SURGERY,  COVID TESTING SITE [address] [place] , IT IS ON THE RIGHT GOING OUT [address] APPROXIMATELY  2 MINUTES PAST [employer] ON THE RIGHT. ONCE YOUR COVID TEST IS COMPLETED,  PLEASE BEGIN THE QUARANTINE INSTRUCTIONS AS OUTLINED IN YOUR HANDOUT.                COE ANGELOS   Your procedure is scheduled on: 10/22/19   Report to [facility] Main  Entrance   Report to admitting at  11:05 AM     Call this number if you have problems the morning of surgery [phone]    Midnight. BRUSH YOUR TEETH MORNING OF SURGERY AND RINSE YOUR MOUTH OUT, NO CHEWING GUM CANDY OR MINTS.  No food after midnight.    You may have clear liquid until 10:00 AM.     CLEAR LIQUID DIET   Foods Allowed                                                                     Foods Excluded  Coffee and tea, regular and decaf                             liquids that you cannot  Plain Jell-O any favor except red or purple                                           see through such as: Fruit ices (not with fruit pulp)                                     milk, soups, orange juice  Iced Popsicles                                    All solid food Carbonated beverages, regular and diet                                    Cranberry, grape and apple juices Sports drinks like Gatorade Lightly seasoned clear broth or consume(fat free) Sugar, honey syrup       At 10:00 AM drink pre surgery drink.   Nothing by mouth after 10:00 AM.    Take these medicines the morning of surgery with A SIP OF WATER: Omeprizole  You may not have any metal on your body including              piercings  Do not wear jewelry,  lotions, powders or deodorant              Men may shave face and neck.   Do not bring valuables to the hospital. [facility] IS NOT             RESPONSIBLE   FOR VALUABLES.  Contacts, dentures or bridgework may not be worn into surgery.      Patients discharged the day of surgery will not be allowed to drive home.   IF YOU ARE HAVING SURGERY AND GOING HOME THE SAME DAY, YOU MUST HAVE AN ADULT TO DRIVE YOU HOME AND BE WITH YOU FOR 24 HOURS.   YOU MAY GO HOME BY TAXI OR UBER OR ORTHERWISE, BUT AN ADULT MUST ACCOMPANY YOU HOME AND STAY WITH YOU FOR 24 HOURS.  Name and phone number of your driver:  Special Instructions: N/A              Please read over the following fact sheets you were given: _____________________________________________________________________             [facility] - Preparing for Surgery  Before surgery, you can play an important role.   Because skin is not sterile, your skin needs to be as free of germs as possible .  You can reduce the number of germs on your skin by washing with CHG (chlorahexidine gluconate) soap before surgery.   CHG is an antiseptic cleaner which kills germs and bonds with the skin to continue killing germs even after washing. Please DO NOT use if you have an allergy to CHG or antibacterial soaps.   If your skin becomes reddened/irritated stop using the CHG and inform your nurse when you arrive at Short Stay. .  You may shave your face/neck.  Please follow these instructions carefully:  1.  Shower with CHG Soap the night before surgery and the  morning of Surgery.  2.  If you choose to wash your hair, wash your hair first as usual with your  normal  shampoo.  3.  After you shampoo, rinse your hair and body thoroughly to remove the  shampoo.                                        4.  Use CHG as you would any other liquid soap.  You can apply  chg directly  to the skin and wash                       Gently with a scrungie or clean washcloth.  5.  Apply the CHG Soap to your body ONLY FROM THE NECK DOWN.   Do not use on face/ open                           Wound or open sores. Avoid contact with eyes, ears mouth and genitals (private parts).                       Wash face,  Genitals (private parts) with your normal soap.             6.  Wash thoroughly, paying special  attention to the area where your surgery  will be performed.  7.  Thoroughly rinse your body with warm water from the neck down.  8.  DO NOT shower/wash with your normal soap after using and rinsing off  the CHG Soap.             9.  Pat yourself dry with a clean towel.            10.  Wear clean pajamas.            11.  Place clean sheets on your bed the night of your first shower and do not  sleep with pets. Day of Surgery : Do not apply any lotions/deodorants the morning of surgery.  Please wear clean clothes to the hospital/surgery center.  FAILURE TO FOLLOW THESE INSTRUCTIONS MAY RESULT IN THE CANCELLATION OF YOUR SURGERY PATIENT SIGNATURE_________________________________  NURSE SIGNATURE__________________________________  ________________________________________________________________________

## 2019-10-18 ENCOUNTER — Other Ambulatory Visit (HOSPITAL_COMMUNITY)
Admission: RE | Admit: 2019-10-18 | Discharge: 2019-10-18 | Disposition: A | Discharge status: Home / Self Care | Payer: 59 | Source: Ambulatory Visit | Attending: Orthopaedic Surgery | Admitting: Orthopaedic Surgery

## 2019-10-18 ENCOUNTER — Encounter (HOSPITAL_COMMUNITY)
Admission: RE | Admit: 2019-10-18 | Discharge: 2019-10-18 | Disposition: A | Discharge status: Home / Self Care | Payer: 59 | Source: Ambulatory Visit | Attending: Orthopaedic Surgery | Admitting: Orthopaedic Surgery

## 2019-10-18 ENCOUNTER — Encounter (HOSPITAL_COMMUNITY): Payer: Self-pay

## 2019-10-18 ENCOUNTER — Other Ambulatory Visit: Payer: Self-pay

## 2019-10-18 DIAGNOSIS — Z01812 Encounter for preprocedural laboratory examination: Secondary | ICD-10-CM | POA: Insufficient documentation

## 2019-10-18 DIAGNOSIS — Z20822 Contact with and (suspected) exposure to covid-19: Secondary | ICD-10-CM | POA: Insufficient documentation

## 2019-10-18 HISTORY — DX: Cardiac arrhythmia, unspecified: I49.9

## 2019-10-18 LAB — BASIC METABOLIC PANEL
Anion gap: 11 (ref 5–15)
BUN: 21 mg/dL (ref 8–23)
CO2: 25 mmol/L (ref 22–32)
Calcium: 9.6 mg/dL (ref 8.9–10.3)
Chloride: 102 mmol/L (ref 98–111)
Creatinine, Ser: 1.04 mg/dL (ref 0.61–1.24)
GFR calc Af Amer: >60 mL/min (ref >60)
GFR calc non Af Amer: >60 mL/min (ref >60)
Glucose, Bld: 174 mg/dL — ABNORMAL HIGH (ref 70–99)
Potassium: 4.8 mmol/L (ref 3.5–5.1)
Sodium: 138 mmol/L (ref 135–145)

## 2019-10-18 LAB — SARS CORONAVIRUS 2 (TAT 6-24 HRS): SARS Coronavirus 2: NEGATIVE

## 2019-10-18 LAB — CBC
HCT: 47 % (ref 39.0–52.0)
Hemoglobin: 15.4 g/dL (ref 13.0–17.0)
MCH: 28.5 pg (ref 26.0–34.0)
MCHC: 32.8 g/dL (ref 30.0–36.0)
MCV: 87 fL (ref 80.0–100.0)
Platelets: 326 10*3/uL (ref 150–400)
RBC: 5.4 MIL/uL (ref 4.22–5.81)
RDW: 12.7 % (ref 11.5–15.5)
WBC: 8 10*3/uL (ref 4.0–10.5)
nRBC: 0 % (ref 0.0–0.2)

## 2019-10-18 NOTE — Progress Notes (Signed)
COVID Vaccine Completed:No Date COVID Vaccine completed: COVID vaccine manufacturer: Pfizer    [employer]   PCP - Dr. Lucky Cowboy Cardiologist - Dr. Verdis Prime  Chest x-ray - no EKG - 09/17/19 Stress Test -  ECHO -  Cardiac Cath - 2007  Sleep Study - no CPAP -   Fasting Blood Sugar - NA Checks Blood Sugar _____ times a day  Blood Thinner Instructions:NA Aspirin Instructions: Last Dose:  Anesthesia review:   Patient denies shortness of breath, fever, cough and chest pain at PAT appointment yes   Patient verbalized understanding of instructions that were given to them at the PAT appointment. Patient was also instructed that they will need to review over the PAT instructions again at home before surgery.  Yes  Pt has had trigeminy in the past that was resolved on his EKG 09/17/19. He reports no SOB working around the house or with ADLs  He has a bulging disc in his neck that he has been going to therapy for.  He had an episode of vertigo with N&V triggered by neck massage.

## 2019-10-21 NOTE — H&P (Signed)
John Andrade is an 64 y.o. male.   Chief Complaint: left hand numbness HPI: John Andrade is here today for follow-up of his left hand.  He continues to have some numbness and tingling.  By a nerve conduction study in 2019 he has got moderate carpal tunnel syndrome.  He recently underwent a right carpal tunnel release and is now doing much better from that.  Past Medical History:  Diagnosis Date  . DDD lumbar   . DJD (degenerative joint disease)   . Dysrhythmia    Trygeminy  . GERD (gastroesophageal reflux disease)    takes Omeprazole daily  . History of kidney stones   . Hyperlipidemia   . Hypertension    takes Lisinopril and Amlodipine daily  . Multiple allergies    takes Claritin and Singulair daily  . PONV (postoperative nausea and vomiting)   . Pre-diabetes    Not on any medications  . Vertigo 10/15/2019   peripheral vertigo last episode was in 2019    Past Surgical History:  Procedure Laterality Date  . ANKLE FRACTURE SURGERY  1995   right  . BACK SURGERY  2002   lumb lam-fusion  . BACK SURGERY  03-2014   Dr Channing Mutters in [place]  . CARDIAC CATHETERIZATION     2007  . CARPAL TUNNEL RELEASE Right 09/24/2019   Procedure: RIGHT CARPAL TUNNEL RELEASE;  Surgeon: Marcene Corning, MD;  Location: [facility];  Service: Orthopedics;  Laterality: Right;  . CHONDROPLASTY Left 10/28/2014   Procedure: CHONDROPLASTY;  Surgeon: Marcene Corning, MD;  Location: [facility];  Service: Orthopedics;  Laterality: Left;  . COLONOSCOPY    . ELBOW LIGAMENT RECONSTRUCTION Right 2015   [place]  . KNEE ARTHROSCOPY Left 04/24/2012   Procedure: ARTHROSCOPY KNEE, PARTIAL MEDIAL MENISECTOMY AND CHONDROPLASTY;  Surgeon: [name], MD;  Location: [facility];  Service: Orthopedics;  Laterality: Left;  . KNEE ARTHROSCOPY WITH MEDIAL MENISECTOMY Left 10/28/2014   Procedure: LEFT ARTHROSCOPY KNEE, PARTIAL MEDIAL MENISECTOMY, CHONDROPLASTY;  Surgeon: Marcene Corning, MD;  Location: [facility];  Service: Orthopedics;  Laterality: Left;  . TOTAL HIP ARTHROPLASTY Right 09/22/2015   Procedure: TOTAL HIP ARTHROPLASTY ANTERIOR APPROACH;  Surgeon: Marcene Corning, MD;  Location: [facility];  Service: Orthopedics;  Laterality: Right;  . TOTAL HIP ARTHROPLASTY Left 01/05/2016   Procedure: LEFT TOTAL HIP ARTHROPLASTY ANTERIOR APPROACH;  Surgeon: Marcene Corning, MD;  Location: [facility];  Service: Orthopedics;  Laterality: Left;  . TRIGGER FINGER RELEASE Right 09/24/2019   Procedure: RIGHT RELEASE TRIGGER FINGER/A-1 PULLEY;  Surgeon: Marcene Corning, MD;  Location: [facility];  Service: Orthopedics;  Laterality: Right;  . WISDOM TOOTH EXTRACTION      Family History  Problem Relation Age of Onset  . Hypertension Mother   . Dementia Mother   . Cancer Father   . Hypertension Father   . Heart disease Father   . Hyperlipidemia Father   . Diabetes Sister   . Cancer Maternal Grandmother        lung   Social History:  reports that he quit smoking about 44 years ago. He has never used smokeless tobacco. He reports that he does not drink alcohol and does not use drugs.  Allergies:  Allergies  Allergen Reactions  . Statins Other (See Comments)    Flu-like symptoms  . Tylenol [Acetaminophen]     Liver enzymes elevated so patient has stopped taking anything with acetaminophen in it  . Phentermine Other (See Comments)  constipation    No medications prior to admission.    No results found for this or any previous visit (from the past 48 hour(s)). No results found.  Review of Systems  Musculoskeletal: Positive for arthralgias.       Left hand  All other systems reviewed and are negative.   There were no vitals taken for this visit. Physical Exam Constitutional:      Appearance: Normal appearance.  HENT:     Head: Normocephalic and atraumatic.     Mouth/Throat:     Mouth: Mucous membranes are moist.  Eyes:     Extraocular Movements: Extraocular movements intact.  Cardiovascular:      Rate and Rhythm: Normal rate.  Pulmonary:     Effort: Pulmonary effort is normal.  Abdominal:     Palpations: Abdomen is soft.  Musculoskeletal:     Comments: He has subjective numbness in median distribution in the left hand but no thenar atrophy that I can appreciate.  He has good refill the tips of his fingers.   Skin:    General: Skin is warm.  Neurological:     General: No focal deficit present.     Mental Status: He is alert and oriented to person, place, and time.  Psychiatric:        Mood and Affect: Mood normal.        Behavior: Behavior normal.        Thought Content: Thought content normal.        Judgment: Judgment normal.      Assessment/Plan Left carpal tunnel syndrome  After discussing with the patient once again reviewed the risk of anesthesia, infection, DVT, bleeding related to a left carpal tunnel release.  He is in agreement and would like to proceed.  We will try and do this with some sedation a local anesthetic.  We will see him as scheduled for his upcoming surgery.  He can call with any questions or concerns in interim.  Gaines Cartmell Jaleal Schliep, PA-C 10/21/2019, 9:59 AM

## 2019-10-22 ENCOUNTER — Encounter (HOSPITAL_COMMUNITY)
Admission: RE | Disposition: A | Discharge status: Home / Self Care | Payer: Self-pay | Source: Home / Self Care | Attending: Orthopaedic Surgery

## 2019-10-22 ENCOUNTER — Encounter (HOSPITAL_COMMUNITY): Payer: Self-pay | Admitting: Orthopaedic Surgery

## 2019-10-22 ENCOUNTER — Ambulatory Visit (HOSPITAL_COMMUNITY): Payer: 59 | Admitting: Certified Registered Nurse Anesthetist

## 2019-10-22 ENCOUNTER — Ambulatory Visit (HOSPITAL_COMMUNITY)
Admission: RE | Admit: 2019-10-22 | Discharge: 2019-10-22 | Disposition: A | Discharge status: Home / Self Care | Payer: 59 | Attending: Orthopaedic Surgery | Admitting: Orthopaedic Surgery

## 2019-10-22 DIAGNOSIS — Z87891 Personal history of nicotine dependence: Secondary | ICD-10-CM | POA: Insufficient documentation

## 2019-10-22 DIAGNOSIS — Z888 Allergy status to other drugs, medicaments and biological substances status: Secondary | ICD-10-CM | POA: Insufficient documentation

## 2019-10-22 DIAGNOSIS — R7303 Prediabetes: Secondary | ICD-10-CM | POA: Diagnosis not present

## 2019-10-22 DIAGNOSIS — M1990 Unspecified osteoarthritis, unspecified site: Secondary | ICD-10-CM | POA: Insufficient documentation

## 2019-10-22 DIAGNOSIS — G5602 Carpal tunnel syndrome, left upper limb: Secondary | ICD-10-CM | POA: Diagnosis not present

## 2019-10-22 DIAGNOSIS — Z886 Allergy status to analgesic agent status: Secondary | ICD-10-CM | POA: Insufficient documentation

## 2019-10-22 DIAGNOSIS — Z8249 Family history of ischemic heart disease and other diseases of the circulatory system: Secondary | ICD-10-CM | POA: Diagnosis not present

## 2019-10-22 DIAGNOSIS — I10 Essential (primary) hypertension: Secondary | ICD-10-CM | POA: Insufficient documentation

## 2019-10-22 DIAGNOSIS — K219 Gastro-esophageal reflux disease without esophagitis: Secondary | ICD-10-CM | POA: Diagnosis not present

## 2019-10-22 HISTORY — PX: CARPAL TUNNEL RELEASE: SHX101

## 2019-10-22 LAB — GLUCOSE, CAPILLARY: Glucose-Capillary: 220 mg/dL — ABNORMAL HIGH (ref 70–99)

## 2019-10-22 SURGERY — RELEASE, CARPAL TUNNEL, ENDOSCOPIC
Anesthesia: Monitor Anesthesia Care | Laterality: Left

## 2019-10-22 MED ORDER — LACTATED RINGERS IV SOLN: INTRAVENOUS | Status: DC

## 2019-10-22 MED ORDER — MIDAZOLAM HCL 2 MG/2ML IJ SOLN
INTRAMUSCULAR | Status: AC
  Filled 2019-10-22: qty 2

## 2019-10-22 MED ORDER — ONDANSETRON HCL 4 MG/2ML IJ SOLN
INTRAMUSCULAR | Status: DC | PRN
  Administered 2019-10-22: 4 mg via INTRAVENOUS

## 2019-10-22 MED ORDER — ORAL CARE MOUTH RINSE: 15.0000 mL | Freq: Once | OROMUCOSAL | Status: AC

## 2019-10-22 MED ORDER — BUPIVACAINE-EPINEPHRINE (PF) 0.25% -1:200000 IJ SOLN
INTRAMUSCULAR | Status: AC
  Filled 2019-10-22: qty 30

## 2019-10-22 MED ORDER — FENTANYL CITRATE (PF) 100 MCG/2ML IJ SOLN
INTRAMUSCULAR | Status: AC
  Filled 2019-10-22: qty 2

## 2019-10-22 MED ORDER — LIDOCAINE 2% (20 MG/ML) 5 ML SYRINGE
INTRAMUSCULAR | Status: AC
  Filled 2019-10-22: qty 5

## 2019-10-22 MED ORDER — 0.9 % SODIUM CHLORIDE (POUR BTL) OPTIME
TOPICAL | Status: DC | PRN
  Administered 2019-10-22: 1000 mL

## 2019-10-22 MED ORDER — ONDANSETRON HCL 4 MG/2ML IJ SOLN
INTRAMUSCULAR | Status: AC
  Filled 2019-10-22: qty 2

## 2019-10-22 MED ORDER — FENTANYL CITRATE (PF) 100 MCG/2ML IJ SOLN
INTRAMUSCULAR | Status: DC | PRN
Start: 2019-10-22 — End: 2019-10-22
  Administered 2019-10-22 (×2): 50 ug via INTRAVENOUS

## 2019-10-22 MED ORDER — ACETAMINOPHEN 500 MG PO TABS
1000.0000 mg | ORAL_TABLET | Freq: Once | ORAL | Status: AC
  Administered 2019-10-22: 1000 mg via ORAL
  Filled 2019-10-22: qty 2

## 2019-10-22 MED ORDER — MIDAZOLAM HCL 5 MG/5ML IJ SOLN
INTRAMUSCULAR | Status: DC | PRN
  Administered 2019-10-22: 2 mg via INTRAVENOUS

## 2019-10-22 MED ORDER — BUPIVACAINE-EPINEPHRINE (PF) 0.25% -1:200000 IJ SOLN
INTRAMUSCULAR | Status: DC | PRN
  Administered 2019-10-22: 2 mL via PERINEURAL

## 2019-10-22 MED ORDER — PROPOFOL 10 MG/ML IV BOLUS
INTRAVENOUS | Status: AC
  Filled 2019-10-22: qty 20

## 2019-10-22 MED ORDER — LIDOCAINE HCL (PF) 1 % IJ SOLN
INTRAMUSCULAR | Status: DC | PRN
  Administered 2019-10-22: 2 mL

## 2019-10-22 MED ORDER — OXYCODONE HCL 5 MG PO CAPS
5.0000 mg | ORAL_CAPSULE | Freq: Four times a day (QID) | ORAL | 0 refills | Status: DC | PRN
Start: 2019-10-22 — End: 2020-03-13

## 2019-10-22 MED ORDER — LIDOCAINE 2% (20 MG/ML) 5 ML SYRINGE
INTRAMUSCULAR | Status: DC | PRN
  Administered 2019-10-22: 20 mg via INTRAVENOUS

## 2019-10-22 MED ORDER — CEFAZOLIN SODIUM-DEXTROSE 2-4 GM/100ML-% IV SOLN
2.0000 g | INTRAVENOUS | Status: AC
  Administered 2019-10-22: 2 g via INTRAVENOUS
  Filled 2019-10-22: qty 100

## 2019-10-22 MED ORDER — PROPOFOL 10 MG/ML IV BOLUS
INTRAVENOUS | Status: DC | PRN
  Administered 2019-10-22: 10 mg via INTRAVENOUS
  Administered 2019-10-22: 20 mg via INTRAVENOUS

## 2019-10-22 MED ORDER — LIDOCAINE HCL (PF) 1 % IJ SOLN
INTRAMUSCULAR | Status: AC
  Filled 2019-10-22: qty 30

## 2019-10-22 MED ORDER — CHLORHEXIDINE GLUCONATE 0.12 % MT SOLN
15.0000 mL | Freq: Once | OROMUCOSAL | Status: AC
  Administered 2019-10-22: 15 mL via OROMUCOSAL

## 2019-10-22 MED ORDER — BUPIVACAINE HCL 0.25 % IJ SOLN
INTRAMUSCULAR | Status: AC
  Filled 2019-10-22: qty 1

## 2019-10-22 MED ORDER — PROPOFOL 500 MG/50ML IV EMUL
INTRAVENOUS | Status: DC | PRN
  Administered 2019-10-22: 50 ug/kg/min via INTRAVENOUS

## 2019-10-22 MED ORDER — PROPOFOL 500 MG/50ML IV EMUL
INTRAVENOUS | Status: AC
  Filled 2019-10-22: qty 50

## 2019-10-22 SURGICAL SUPPLY — 52 items
BLADE MINI RND TIP GREEN BEAV (BLADE) IMPLANT
BLADE SURG 15 STRL LF DISP TIS (BLADE) ×1 IMPLANT
BLADE SURG 15 STRL SS (BLADE) ×2
BNDG CMPR 9X4 STRL LF SNTH (GAUZE/BANDAGES/DRESSINGS) ×1
BNDG ELASTIC 2X5.8 VLCR STR LF (GAUZE/BANDAGES/DRESSINGS) IMPLANT
BNDG ELASTIC 3X5.8 VLCR STR LF (GAUZE/BANDAGES/DRESSINGS) ×2 IMPLANT
BNDG ELASTIC 4X5.8 VLCR STR LF (GAUZE/BANDAGES/DRESSINGS) ×2 IMPLANT
BNDG ESMARK 4X9 LF (GAUZE/BANDAGES/DRESSINGS) ×2 IMPLANT
BNDG GAUZE ELAST 4 BULKY (GAUZE/BANDAGES/DRESSINGS) ×2 IMPLANT
BNDG PLASTER X FAST 3X3 WHT LF (CAST SUPPLIES) IMPLANT
BNDG PLSTR 9X3 FST ST WHT (CAST SUPPLIES)
CORD BIPOLAR FORCEPS 12FT (ELECTRODE) ×2 IMPLANT
COVER BACK TABLE 60X90IN (DRAPES) ×2 IMPLANT
COVER MAYO STAND STRL (DRAPES) ×2 IMPLANT
COVER WAND RF STERILE (DRAPES) IMPLANT
CUFF TOURN SGL QUICK 18X4 (TOURNIQUET CUFF) ×2 IMPLANT
DRAPE EXTREMITY T 121X128X90 (DISPOSABLE) ×2 IMPLANT
DRAPE SURG 17X23 STRL (DRAPES) ×2 IMPLANT
DRAPE U-SHAPE 47X51 STRL (DRAPES) ×2 IMPLANT
DRSG EMULSION OIL 3X3 NADH (GAUZE/BANDAGES/DRESSINGS) IMPLANT
DRSG PAD ABDOMINAL 8X10 ST (GAUZE/BANDAGES/DRESSINGS) IMPLANT
DURAPREP 26ML APPLICATOR (WOUND CARE) ×2 IMPLANT
ELECT NEEDLE TIP 2.8 STRL (NEEDLE) IMPLANT
ELECT REM PT RETURN 15FT ADLT (MISCELLANEOUS) ×2 IMPLANT
GAUZE SPONGE 4X4 12PLY STRL (GAUZE/BANDAGES/DRESSINGS) ×2 IMPLANT
GAUZE XEROFORM 1X8 LF (GAUZE/BANDAGES/DRESSINGS) ×2 IMPLANT
GLOVE BIO SURGEON STRL SZ8 (GLOVE) ×4 IMPLANT
GLOVE BIOGEL PI IND STRL 8 (GLOVE) ×2 IMPLANT
GLOVE BIOGEL PI INDICATOR 8 (GLOVE) ×2
GOWN STRL REUS W/ TWL XL LVL3 (GOWN DISPOSABLE) ×2 IMPLANT
GOWN STRL REUS W/TWL XL LVL3 (GOWN DISPOSABLE) ×4
KIT BASIN OR (CUSTOM PROCEDURE TRAY) ×2 IMPLANT
LOOP VESSEL MAXI BLUE (MISCELLANEOUS) IMPLANT
NEEDLE HYPO 25X1 1.5 SAFETY (NEEDLE) IMPLANT
NS IRRIG 1000ML POUR BTL (IV SOLUTION) ×2 IMPLANT
PAD CAST 4YDX4 CTTN HI CHSV (CAST SUPPLIES) ×1 IMPLANT
PADDING CAST ABS 4INX4YD NS (CAST SUPPLIES)
PADDING CAST ABS COTTON 4X4 ST (CAST SUPPLIES) IMPLANT
PADDING CAST COTTON 4X4 STRL (CAST SUPPLIES) ×2
PENCIL SMOKE EVACUATOR (MISCELLANEOUS) IMPLANT
SPLINT PLASTER EXTRA FAST 3X15 (CAST SUPPLIES) ×1
SPLINT PLASTER GYPS XFAST 3X15 (CAST SUPPLIES) ×1 IMPLANT
STOCKINETTE 4X48 STRL (DRAPES) ×2 IMPLANT
SUCTION FRAZIER HANDLE 12FR (TUBING) ×2
SUCTION TUBE FRAZIER 12FR DISP (TUBING) ×1 IMPLANT
SUT ETHILON 3 0 PS 1 (SUTURE) ×2 IMPLANT
SUT ETHILON 4 0 PS 2 18 (SUTURE) ×2 IMPLANT
SUT VIC AB 4-0 P-3 18XBRD (SUTURE) IMPLANT
SUT VIC AB 4-0 P3 18 (SUTURE)
SYR CONTROL 10ML LL (SYRINGE) IMPLANT
TOWEL OR 17X26 10 PK STRL BLUE (TOWEL DISPOSABLE) ×2 IMPLANT
UNDERPAD 30X36 HEAVY ABSORB (UNDERPADS AND DIAPERS) ×2 IMPLANT

## 2019-10-22 NOTE — Discharge Instructions (Signed)
General Anesthesia, Adult, Care After This sheet gives you information about how to care for yourself after your procedure. Your health care provider may also give you more specific instructions. If you have problems or questions, contact your health care provider. What can I expect after the procedure? After the procedure, the following side effects are common:  Pain or discomfort at the IV site.  Nausea.  Vomiting.  Sore throat.  Trouble concentrating.  Feeling cold or chills.  Weak or tired.  Sleepiness and fatigue.  Soreness and body aches. These side effects can affect parts of the body that were not involved in surgery. Follow these instructions at home:  For at least 24 hours after the procedure:  Have a responsible adult stay with you. It is important to have someone help care for you until you are awake and alert.  Rest as needed.  Do not: ? Participate in activities in which you could fall or become injured. ? Drive. ? Use heavy machinery. ? Drink alcohol. ? Take sleeping pills or medicines that cause drowsiness. ? Make important decisions or sign legal documents. ? Take care of children on your own. Eating and drinking  Follow any instructions from your health care provider about eating or drinking restrictions.  When you feel hungry, start by eating small amounts of foods that are soft and easy to digest (bland), such as toast. Gradually return to your regular diet.  Drink enough fluid to keep your urine pale yellow.  If you vomit, rehydrate by drinking water, juice, or clear broth. General instructions  If you have sleep apnea, surgery and certain medicines can increase your risk for breathing problems. Follow instructions from your health care provider about wearing your sleep device: ? Anytime you are sleeping, including during daytime naps. ? While taking prescription pain medicines, sleeping medicines, or medicines that make you drowsy.  Return to  your normal activities as told by your health care provider. Ask your health care provider what activities are safe for you.  Take over-the-counter and prescription medicines only as told by your health care provider.  If you smoke, do not smoke without supervision.  Keep all follow-up visits as told by your health care provider. This is important. Contact a health care provider if:  You have nausea or vomiting that does not get better with medicine.  You cannot eat or drink without vomiting.  You have pain that does not get better with medicine.  You are unable to pass urine.  You develop a skin rash.  You have a fever.  You have redness around your IV site that gets worse. Get help right away if:  You have difficulty breathing.  You have chest pain.  You have blood in your urine or stool, or you vomit blood. Summary  After the procedure, it is common to have a sore throat or nausea. It is also common to feel tired.  Have a responsible adult stay with you for the first 24 hours after general anesthesia. It is important to have someone help care for you until you are awake and alert.  When you feel hungry, start by eating small amounts of foods that are soft and easy to digest (bland), such as toast. Gradually return to your regular diet.  Drink enough fluid to keep your urine pale yellow.  Return to your normal activities as told by your health care provider. Ask your health care provider what activities are safe for you. This information is not   intended to replace advice given to you by your health care provider. Make sure you discuss any questions you have with your health care provider. Document Revised: 02/10/2017 Document Reviewed: 09/23/2016 Elsevier Patient Education  2020 [employer]  

## 2019-10-22 NOTE — Transfer of Care (Signed)
Immediate Anesthesia Transfer of Care Note  Patient: John Andrade  Procedure(s) Performed: LEFT CARPAL TUNNEL RELEASE (Left )  Patient Location: PACU  Anesthesia Type:MAC  Level of Consciousness: awake and patient cooperative  Airway & Oxygen Therapy: Patient Spontanous Breathing and Patient connected to face mask oxygen  Post-op Assessment: Report given to RN and Post -op Vital signs reviewed and stable  Post vital signs: Reviewed and stable  Last Vitals:  Vitals Value Taken Time  BP 109/76 10/22/19 1251  Temp    Pulse 54 10/22/19 1252  Resp 12 10/22/19 1252  SpO2 100 % 10/22/19 1252  Vitals shown include unvalidated device data.  Last Pain:  Vitals:   10/22/19 1119  TempSrc: Oral         Complications: No complications documented.

## 2019-10-22 NOTE — Interval H&P Note (Signed)
History and Physical Interval Note:  10/22/2019 11:27 AM  John Andrade  has presented today for surgery, with the diagnosis of LEFT CARPEL TUNNEL SYNDROME.  The various methods of treatment have been discussed with the patient and family. After consideration of risks, benefits and other options for treatment, the patient has consented to  Procedure(s): LEFT CARPAL TUNNEL RELEASE (Left) as a surgical intervention.  The patient's history has been reviewed, patient examined, no change in status, stable for surgery.  I have reviewed the patient's chart and labs.  Questions were answered to the patient's satisfaction.     [name]

## 2019-10-22 NOTE — Anesthesia Postprocedure Evaluation (Signed)
Anesthesia Post Note  Patient: John Andrade  Procedure(s) Performed: LEFT CARPAL TUNNEL RELEASE (Left )     Patient location during evaluation: PACU Anesthesia Type: MAC Level of consciousness: awake and alert Pain management: pain level controlled Vital Signs Assessment: post-procedure vital signs reviewed and stable Respiratory status: spontaneous breathing, nonlabored ventilation and respiratory function stable Cardiovascular status: blood pressure returned to baseline and stable Postop Assessment: no apparent nausea or vomiting Anesthetic complications: no   No complications documented.  Last Vitals:  Vitals:   10/22/19 1325 10/22/19 1350  BP: (!) 166/96 (!) 142/94  Pulse:  (!) 55  Resp: 14 16  Temp: (!) 36.4 C   SpO2: 96% 98%    Last Pain:  Vitals:   10/22/19 1350  TempSrc:   PainSc: 0-No pain                 Pervis Hocking

## 2019-10-22 NOTE — Anesthesia Preprocedure Evaluation (Addendum)
Anesthesia Evaluation  Patient identified by MRN, date of birth, ID band Patient awake    Reviewed: Allergy & Precautions, NPO status , Patient's Chart, lab work & pertinent test results  History of Anesthesia Complications (+) PONV and history of anesthetic complications  Airway Mallampati: III  TM Distance: >3 FB Neck ROM: Full    Dental no notable dental hx. (+) Teeth Intact, Dental Advisory Given   Pulmonary former smoker,  Quit smoking 1977 Has never had sleep study   Pulmonary exam normal breath sounds clear to auscultation       Cardiovascular hypertension, Pt. on medications Normal cardiovascular exam Rhythm:Regular Rate:Normal     Neuro/Psych vertigo negative neurological ROS  negative psych ROS   GI/Hepatic Neg liver ROS, GERD  Medicated and Controlled,  Endo/Other  diabetes (pre-diabetic, increasing a1c), Poorly ControlledMorbid obesityBMI 43 a1c 7.4  Renal/GU negative Renal ROS  negative genitourinary   Musculoskeletal  (+) Arthritis , Osteoarthritis,  Chronic pain   Abdominal (+) + obese,   Peds  Hematology negative hematology ROS (+)   Anesthesia Other Findings   Reproductive/Obstetrics negative OB ROS                            Anesthesia Physical Anesthesia Plan  ASA: III  Anesthesia Plan: MAC   Post-op Pain Management:    Induction:   PONV Risk Score and Plan: 2 and Propofol infusion and TIVA  Airway Management Planned: Natural Airway and Simple Face Mask  Additional Equipment: None  Intra-op Plan:   Post-operative Plan:   Informed Consent: I have reviewed the patients History and Physical, chart, labs and discussed the procedure including the risks, benefits and alternatives for the proposed anesthesia with the patient or authorized representative who has indicated his/her understanding and acceptance.       Plan Discussed with: CRNA  Anesthesia  Plan Comments:        Anesthesia Quick Evaluation

## 2019-10-22 NOTE — Op Note (Signed)
PRE-OP DIAGNOSIS:  left carpal tunnel syndrome POST-OP DIAGNOSIS:  same PROCEDURE:  left carpal tunnel release ANESTHESIA:  Local and MAC ATTENDING SURGEON:  Melrose Nakayama MD ASSISTANT:  Loni Dolly PA  INDICATION FOR PROCEDURE:  John Andrade is a 64 y.o. male with a long history of hand pain and numbness.  The patient has failed conservative measures of treatment and persists with symptoms which are very limiting.  The patient is offered carpal tunnel release in hopes of improving their situation by relieving pressure on the medial nerve.  Informed operative consent was obtained after discussion of possible complications including reaction to anesthesia, infection, and neurovascular injury.  SUMMARY OF FINDINGS AND PROCEDURE:  Under to above anesthetic a carpal tunnel release was performed.  The patient had a severly thickened transverse carpal tunnel ligament which was applying compression to the underlying medial nerve.  This was release under direct visualization.  The skin was closed primarily followed by placement of a sterile dressing and splint with the wrist in slight extension.  DESCRIPTION OF PROCEDURE:  John Andrade was taken to an operative suite where the above anesthetic was applied.  The patient was then prepped and draped in normal sterile fashion.  An appropriate time out was performed.  After the administration of kefzol pre-operative antibiotic and application and inflation of a tourniquet a small incision was made on the palmar aspect of the palm.  This did not cross the wrist flexion crease and was ulnar to the thenar flexion crease.  Dissection was carried down through palmar fascia to expose the transverse carpal ligament which was released under direct visualization.  The dissection was taken distally towards the transverse arch of vessels and distally through the distal fascia of the forearm.  The wound was irrigated and the skin was closed with nylon.  The tourniquet was deflated  and skin edges became pink immediately and pulses were intact.  Adaptic was applied along with a sterile dressing and a plaster splint with the wrist in slight extension.  Estimated blood loss and intraoperative fluids can be obtained from anesthesia records as can tourniquet time.  DISPOSITION:  John Andrade was taken to recovery room in stable condition.  Plans were to go home same day and I will contact the patient by phone tonight.  The patient will follow-up in about a week in the office.  Hessie Dibble 10/22/2019, 12:37 PM

## 2019-10-23 ENCOUNTER — Encounter (HOSPITAL_COMMUNITY): Payer: Self-pay | Admitting: Orthopaedic Surgery

## 2019-11-05 ENCOUNTER — Other Ambulatory Visit: Payer: Self-pay | Admitting: Physician Assistant

## 2019-11-12 ENCOUNTER — Other Ambulatory Visit: Payer: Self-pay | Admitting: Adult Health

## 2019-12-05 ENCOUNTER — Encounter: Payer: Self-pay | Admitting: Adult Health

## 2019-12-05 DIAGNOSIS — E1122 Type 2 diabetes mellitus with diabetic chronic kidney disease: Secondary | ICD-10-CM | POA: Insufficient documentation

## 2019-12-05 DIAGNOSIS — J3089 Other allergic rhinitis: Secondary | ICD-10-CM | POA: Insufficient documentation

## 2019-12-05 DIAGNOSIS — N182 Chronic kidney disease, stage 2 (mild): Secondary | ICD-10-CM | POA: Insufficient documentation

## 2019-12-05 DIAGNOSIS — Z9109 Other allergy status, other than to drugs and biological substances: Secondary | ICD-10-CM | POA: Insufficient documentation

## 2019-12-05 DIAGNOSIS — E538 Deficiency of other specified B group vitamins: Secondary | ICD-10-CM | POA: Insufficient documentation

## 2019-12-05 NOTE — Progress Notes (Signed)
FOLLOW UP  Assessment and Plan:   Atherosclerosis of aorta Control blood pressure, cholesterol, glucose, increase exercise.   Hypertension Persistently above goal; increase lisinopril to 40 mg, recheck in 2-4 weeks  Monitor blood pressure at home; patient to call if consistently greater than 130/80 Continue DASH diet.   Reminder to go to the ER if any CP, SOB, nausea, dizziness, severe HA, changes vision/speech, left arm numbness and tingling and jaw pain.  Hyperlipidemia associated with T2DM (HCC)/ statin myopathy Currently severely above goal, not on medication; has documented statin intolerance; and could not tolerate zetia, could not afford nexletrol Recommend lipid clinic referral - he declines at this time, reports has responded very well to lifestyle in the past, motivated to work aggressively on weight loss Consider coronary calcium CT Continue low cholesterol diet and exercise.  Check lipid panel, goal less than 70  Diabetes with CKD (HCC) Continue medication: currently treated by lifestyle only, didn't tolerate metformin Continue diet and exercise - low GI, high fiber discussed at length Perform daily foot/skin check, notify office of any concerning changes.  Check A1C  CKD II associated with T2DM (HCC) Increase fluids, avoid NSAIDS, monitor sugars, will monitor  Morbid obesity with co morbidities Long discussion about weight loss, diet, and exercise Recommended diet heavy in fruits and veggies and low in animal meats, cheeses, and dairy products, appropriate calorie intake Patient will work on cutting down on carbs, high fiber, low glycemic index Weight loss goal of <250 lb for next OV set, will continue close follow up  Will follow up in 3 months  Vitamin D Def Continue supplement for goal of 60-100 Defer levels   Vertigo -nonsmoker, normal neuro, does have history of vertigo in the past, meclizine PRN, exercises given, add bASA, if worsening HA, changes  vision/speech, imbalance, weakness go to the ER  Need for influenza vaccine Quadrivalent flu vaccine administered without complication today    Continue diet and meds as discussed. Further disposition pending results of labs. Discussed med's effects and SE's.   Over 30 minutes of exam, counseling, chart review, and critical decision making was performed.   No future appointments.  ----------------------------------------------------------------------------------------------------------------------  HPI 64 y.o. male  presents for 3 month follow up for hypertension, cholesterol, diabetes with CKD, morbid obesity and vitamin D deficiency.   He has hx of peripheral vertigo/BPV, having recurrent episode since last week, requesting meclizine. Reports worst when rolling over to to the left in bed. Also allergies flaring, taking zyrtec/singulair, needs flonase refilled.   He has chronic pain, follows with Dr. Fara Chute, recently underwent bil carpal tunnel release and reports doing well, just had follow up last week. He is pending R shoulder MRI due to suspected rotator cuff tear after negative cervical MRI. Celebrex, oxycodone PRN.   BMI is Body mass index is 43.77 kg/m., he has not been working on diet, knows he needs to get this down; very motivated to work on lifestyle, reports pasta/rice is main barrier, he can reduce but family very resistant, sweets aren't a problem for him. Discussed options and substitutions he could do at length.  STRONGLY prefers to avoid medications;  Wt Readings from Last 3 Encounters:  12/09/19 263 lb (119.3 kg)  10/18/19 257 lb (116.6 kg)  09/17/19 (!) 263 lb 5 oz (119.4 kg)   His blood pressure has been mildly elevated at home , today their BP is BP: (!) 150/92   He does not workout. He denies chest pain, shortness of breath, dizziness.  He has aortic atherosclerosis per CXR 2017  He is not on cholesterol medication, could not tolerate zetia and any statins due  to flu like symptoms, couldn't afford nexletol ($900)  His cholesterol is not at goal. The cholesterol last visit was:   Lab Results  Component Value Date   CHOL 254 (H) 07/16/2019   HDL 44 07/16/2019   LDLCALC 175 (H) 07/16/2019   TRIG 192 (H) 07/16/2019   CHOLHDL 5.8 (H) 07/16/2019    He has been working on diet for T2 diabetes, not on metformin (GI SE even with low doses) and denies foot ulcerations, increased appetite, nausea, paresthesia of the feet, polydipsia, polyuria, visual disturbances, vomiting and weight loss.  Last A1C in the office was:  Lab Results  Component Value Date   HGBA1C 7.4 (H) 09/17/2019   He has CKD I/II associated with T2DM, on lisinopril Lab Results  Component Value Date   GFRNONAA >60 10/18/2019   Patient is on Vitamin D supplement, 7000 IU a day   Lab Results  Component Value Date   VD25OH 39 07/16/2019        Current Medications:  Current Outpatient Medications on File Prior to Visit  Medication Sig  . amLODipine (NORVASC) 10 MG tablet Take    1 tablet     Daily      for BP  . celecoxib (CELEBREX) 200 MG capsule Take 200 mg by mouth at bedtime.   . cetirizine (ZYRTEC) 10 MG tablet Take 10 mg by mouth at bedtime.   . Cholecalciferol (VITAMIN D3) 125 MCG (5000 UT) TABS Take 5,000 Units by mouth daily.  . Cyanocobalamin (VITAMIN B-12) 3000 MCG SUBL Place 3,000 mcg under the tongue daily.  Marland Kitchen L-Lysine 500 MG TABS Take 500 mg by mouth daily.   Marland Kitchen lisinopril (ZESTRIL) 20 MG tablet Take    1 tablet    For     BP  . Magnesium 250 MG TABS Take 250 mg by mouth daily.  . montelukast (SINGULAIR) 10 MG tablet Take    1 tablet   Daily    for Allergies  . omeprazole (PRILOSEC) 40 MG capsule Take    1 capsule    2 x /day      to Prevent Heartburn & Indigestion  . oxycodone (OXY-IR) 5 MG capsule Take 1 capsule (5 mg total) by mouth every 6 (six) hours as needed (post op pain).  . Zinc 50 MG TABS Take 50 mg by mouth daily.  . Cholecalciferol (VITAMIN D3) 50  MCG (2000 UT) TABS Take 2,000 Units by mouth daily.   No current facility-administered medications on file prior to visit.     Allergies:  Allergies  Allergen Reactions  . Statins Other (See Comments)    Flu-like symptoms  . Tylenol [Acetaminophen]     Liver enzymes elevated so patient has stopped taking anything with acetaminophen in it  . Phentermine Other (See Comments)    constipation     Medical History:  Past Medical History:  Diagnosis Date  . COVID-19 03/18/2019   Jan 6th 2021  . DDD lumbar   . DJD (degenerative joint disease)   . Dysrhythmia    Trygeminy  . GERD (gastroesophageal reflux disease)    takes Omeprazole daily  . History of kidney stones   . Hyperlipidemia   . Hypertension    takes Lisinopril and Amlodipine daily  . Multiple allergies    takes Claritin and Singulair daily  . PONV (postoperative nausea and  vomiting)   . Pre-diabetes    Not on any medications  . Vertigo 10/15/2019   peripheral vertigo last episode was in 2019   Family history- Reviewed and unchanged Social history- Reviewed and unchanged   Review of Systems:  Review of Systems  Constitutional: Negative for malaise/fatigue and weight loss.  HENT: Negative for hearing loss and tinnitus.   Eyes: Negative for blurred vision and double vision.  Respiratory: Negative for cough, shortness of breath and wheezing.   Cardiovascular: Negative for chest pain, palpitations, orthopnea, claudication and leg swelling.  Gastrointestinal: Negative for abdominal pain, blood in stool, constipation, diarrhea, heartburn, melena, nausea and vomiting.  Genitourinary: Negative.   Musculoskeletal: Positive for back pain. Negative for joint pain (Occasional R hip s/p bil replacement, follows Dr. Fara Chute) and myalgias.  Skin: Negative for rash.  Neurological: Positive for dizziness (with rolling over in bed). Negative for tingling, sensory change, weakness and headaches.  Endo/Heme/Allergies: Positive  for environmental allergies. Negative for polydipsia.  Psychiatric/Behavioral: Negative.   All other systems reviewed and are negative.   Physical Exam: BP (!) 150/92   Pulse 61   Temp (!) 96.3 F (35.7 C)   Ht 5\' 5"  (1.651 m)   Wt 263 lb (119.3 kg)   SpO2 98%   BMI 43.77 kg/m  Wt Readings from Last 3 Encounters:  12/09/19 263 lb (119.3 kg)  10/18/19 257 lb (116.6 kg)  09/17/19 (!) 263 lb 5 oz (119.4 kg)   General Appearance: morbidly obese, in no apparent distress. Eyes: PERRLA, EOMs, conjunctiva no swelling or erythema Sinuses: No Frontal/maxillary tenderness ENT/Mouth: Ext aud canals clear, TMs without erythema, bulging. No erythema, swelling, or exudate on post pharynx.  Tonsils not swollen or erythematous. Hearing normal.  Neck: Supple, thyroid normal.  Respiratory: Respiratory effort normal, BS equal bilaterally without rales, rhonchi, wheezing or stridor.  Cardio: RRR with no MRGs. Brisk peripheral pulses without edema.  Abdomen: Soft, obese abdomen limiting exam, + BS.  Non tender, no guarding, rebound, hernias, masses. Lymphatics: Non tender without lymphadenopathy.  Musculoskeletal: Full ROM, 5/5 strength, Normal gait.  Skin: Warm, dry without rashes, lesions, ecchymosis.  Neuro: Cranial nerves intact. No cerebellar symptoms.  Psych: Awake and oriented X 3, normal affect, Insight and Judgment appropriate.    09/19/19, NP 12:22 PM [facility]

## 2019-12-09 ENCOUNTER — Other Ambulatory Visit: Payer: Self-pay

## 2019-12-09 ENCOUNTER — Ambulatory Visit: Payer: 59 | Admitting: Adult Health

## 2019-12-09 ENCOUNTER — Encounter: Payer: Self-pay | Admitting: Adult Health

## 2019-12-09 VITALS — BP 150/92 | HR 61 | Temp 96.3°F | Ht 65.0 in | Wt 263.0 lb

## 2019-12-09 DIAGNOSIS — E782 Mixed hyperlipidemia: Secondary | ICD-10-CM

## 2019-12-09 DIAGNOSIS — I700 Atherosclerosis of aorta: Secondary | ICD-10-CM

## 2019-12-09 DIAGNOSIS — Z79899 Other long term (current) drug therapy: Secondary | ICD-10-CM | POA: Diagnosis not present

## 2019-12-09 DIAGNOSIS — N182 Chronic kidney disease, stage 2 (mild): Secondary | ICD-10-CM

## 2019-12-09 DIAGNOSIS — E1122 Type 2 diabetes mellitus with diabetic chronic kidney disease: Secondary | ICD-10-CM

## 2019-12-09 DIAGNOSIS — I10 Essential (primary) hypertension: Secondary | ICD-10-CM

## 2019-12-09 DIAGNOSIS — E1169 Type 2 diabetes mellitus with other specified complication: Secondary | ICD-10-CM

## 2019-12-09 DIAGNOSIS — H8112 Benign paroxysmal vertigo, left ear: Secondary | ICD-10-CM

## 2019-12-09 DIAGNOSIS — E559 Vitamin D deficiency, unspecified: Secondary | ICD-10-CM

## 2019-12-09 DIAGNOSIS — J0100 Acute maxillary sinusitis, unspecified: Secondary | ICD-10-CM

## 2019-12-09 DIAGNOSIS — G720 Drug-induced myopathy: Secondary | ICD-10-CM

## 2019-12-09 DIAGNOSIS — T466X5A Adverse effect of antihyperlipidemic and antiarteriosclerotic drugs, initial encounter: Secondary | ICD-10-CM

## 2019-12-09 DIAGNOSIS — E538 Deficiency of other specified B group vitamins: Secondary | ICD-10-CM

## 2019-12-09 MED ORDER — FLUTICASONE PROPIONATE 50 MCG/ACT NA SUSP: 2.0000 | Freq: Every day | NASAL | 1 refills | Status: DC

## 2019-12-09 MED ORDER — LISINOPRIL 40 MG PO TABS: ORAL_TABLET | ORAL | 1 refills | Status: DC

## 2019-12-09 MED ORDER — MECLIZINE HCL 25 MG PO TABS: ORAL_TABLET | ORAL | 0 refills | Status: DC

## 2019-12-09 NOTE — Patient Instructions (Addendum)
Goals    . Weight (lb) < 250 lb (113.4 kg)     Weigh weekly and keep a log, goal of 0.5-2 lb/week       Try Dave's killer bread - instead of sandwiich bread  Try to limit portions of pasta (about a palm size is 1 serving)  Try brown rice instead of white/yellow rice Can do cauliflour rice, or look for brown rice/quinoa packages, etc "seeds of change"    Try switching to fexofenadine   Try nasal saline sprays/irrigation,   Flonase nasal spray sent in   Increase lisinopril to 40 mg (2 tabs until you run out, then new dose)      High-Fiber Diet Fiber, also called dietary fiber, is a type of carbohydrate that is found in fruits, vegetables, whole grains, and beans. A high-fiber diet can have many health benefits. Your health care provider may recommend a high-fiber diet to help:  Prevent constipation. Fiber can make your bowel movements more regular.  Lower your cholesterol.  Relieve the following conditions: ? Swelling of veins in the anus (hemorrhoids). ? Swelling and irritation (inflammation) of specific areas of the digestive tract (uncomplicated diverticulosis). ? A problem of the large intestine (colon) that sometimes causes pain and diarrhea (irritable bowel syndrome, IBS).  Prevent overeating as part of a weight-loss plan.  Prevent heart disease, type 2 diabetes, and certain cancers. What is my plan? The recommended daily fiber intake in grams (g) includes:  38 g for men age 36 or younger.  30 g for men over age 39.  25 g for women age 54 or younger.  21 g for women over age 32. You can get the recommended daily intake of dietary fiber by:  Eating a variety of fruits, vegetables, grains, and beans.  Taking a fiber supplement, if it is not possible to get enough fiber through your diet. What do I need to know about a high-fiber diet?  It is better to get fiber through food sources rather than from fiber supplements. There is not a lot of research about how  effective supplements are.  Always check the fiber content on the nutrition facts label of any prepackaged food. Look for foods that contain 5 g of fiber or more per serving.  Talk with a diet and nutrition specialist (dietitian) if you have questions about specific foods that are recommended or not recommended for your medical condition, especially if those foods are not listed below.  Gradually increase how much fiber you consume. If you increase your intake of dietary fiber too quickly, you may have bloating, cramping, or gas.  Drink plenty of water. Water helps you to digest fiber. What are tips for following this plan?  Eat a wide variety of high-fiber foods.  Make sure that half of the grains that you eat each day are whole grains.  Eat breads and cereals that are made with whole-grain flour instead of refined flour or white flour.  Eat brown rice, bulgur wheat, or millet instead of white rice.  Start the day with a breakfast that is high in fiber, such as a cereal that contains 5 g of fiber or more per serving.  Use beans in place of meat in soups, salads, and pasta dishes.  Eat high-fiber snacks, such as berries, raw vegetables, nuts, and popcorn.  Choose whole fruits and vegetables instead of processed forms like juice or sauce. What foods can I eat?  Fruits Berries. Pears. Apples. Oranges. Avocado. Prunes and raisins.  Dried figs. Vegetables Sweet potatoes. Spinach. Kale. Artichokes. Cabbage. Broccoli. Cauliflower. Green peas. Carrots. Squash. Grains Whole-grain breads. Multigrain cereal. Oats and oatmeal. Brown rice. Barley. Bulgur wheat. Millet. Quinoa. Bran muffins. Popcorn. Rye wafer crackers. Meats and other proteins Navy, kidney, and pinto beans. Soybeans. Split peas. Lentils. Nuts and seeds. Dairy Fiber-fortified yogurt. Beverages Fiber-fortified soy milk. Fiber-fortified orange juice. Other foods Fiber bars. The items listed above may not be a complete list  of recommended foods and beverages. Contact a dietitian for more options. What foods are not recommended? Fruits Fruit juice. Cooked, strained fruit. Vegetables Fried potatoes. Canned vegetables. Well-cooked vegetables. Grains White bread. Pasta made with refined flour. White rice. Meats and other proteins Fatty cuts of meat. Fried chicken or fried fish. Dairy Milk. Yogurt. Cream cheese. Sour cream. Fats and oils Butters. Beverages Soft drinks. Other foods Cakes and pastries. The items listed above may not be a complete list of foods and beverages to avoid. Contact a dietitian for more information. Summary  Fiber is a type of carbohydrate. It is found in fruits, vegetables, whole grains, and beans.  There are many health benefits of eating a high-fiber diet, such as preventing constipation, lowering blood cholesterol, helping with weight loss, and reducing your risk of heart disease, diabetes, and certain cancers.  Gradually increase your intake of fiber. Increasing too fast can result in cramping, bloating, and gas. Drink plenty of water while you increase your fiber.  The best sources of fiber include whole fruits and vegetables, whole grains, nuts, seeds, and beans. This information is not intended to replace advice given to you by your health care provider. Make sure you discuss any questions you have with your health care provider. Document Revised: 12/12/2016 Document Reviewed: 12/12/2016 Elsevier Patient Education  2020 [employer].

## 2019-12-10 LAB — TSH: TSH: 1.25 mIU/L (ref 0.40–4.50)

## 2019-12-10 LAB — COMPLETE METABOLIC PANEL WITH GFR
AG Ratio: 1.7 (calc) (ref 1.0–2.5)
ALT: 25 U/L (ref 9–46)
AST: 19 U/L (ref 10–35)
Albumin: 4.5 g/dL (ref 3.6–5.1)
Alkaline phosphatase (APISO): 105 U/L (ref 35–144)
BUN: 20 mg/dL (ref 7–25)
CO2: 26 mmol/L (ref 20–32)
Calcium: 9.9 mg/dL (ref 8.6–10.3)
Chloride: 104 mmol/L (ref 98–110)
Creat: 0.88 mg/dL (ref 0.70–1.25)
GFR, Est African American: 107 mL/min/{1.73_m2} (ref >=60)
GFR, Est Non African American: 92 mL/min/{1.73_m2} (ref >=60)
Globulin: 2.7 g/dL (calc) (ref 1.9–3.7)
Glucose, Bld: 153 mg/dL — ABNORMAL HIGH (ref 65–99)
Potassium: 4.6 mmol/L (ref 3.5–5.3)
Sodium: 140 mmol/L (ref 135–146)
Total Bilirubin: 0.5 mg/dL (ref 0.2–1.2)
Total Protein: 7.2 g/dL (ref 6.1–8.1)

## 2019-12-10 LAB — CBC WITH DIFFERENTIAL/PLATELET
Absolute Monocytes: 509 cells/uL (ref 200–950)
Basophils Absolute: 20 cells/uL (ref 0–200)
Basophils Relative: 0.3 %
Eosinophils Absolute: 181 cells/uL (ref 15–500)
Eosinophils Relative: 2.7 %
HCT: 45.3 % (ref 38.5–50.0)
Hemoglobin: 14.9 g/dL (ref 13.2–17.1)
Lymphs Abs: 1910 cells/uL (ref 850–3900)
MCH: 28.4 pg (ref 27.0–33.0)
MCHC: 32.9 g/dL (ref 32.0–36.0)
MCV: 86.5 fL (ref 80.0–100.0)
MPV: 10.7 fL (ref 7.5–12.5)
Monocytes Relative: 7.6 %
Neutro Abs: 4080 cells/uL (ref 1500–7800)
Neutrophils Relative %: 60.9 %
Platelets: 316 10*3/uL (ref 140–400)
RBC: 5.24 10*6/uL (ref 4.20–5.80)
RDW: 12.7 % (ref 11.0–15.0)
Total Lymphocyte: 28.5 %
WBC: 6.7 10*3/uL (ref 3.8–10.8)

## 2019-12-10 LAB — LIPID PANEL
Cholesterol: 259 mg/dL — ABNORMAL HIGH (ref <200)
HDL: 46 mg/dL (ref >=40)
LDL Cholesterol (Calc): 181 mg/dL (calc) — ABNORMAL HIGH
Non-HDL Cholesterol (Calc): 213 mg/dL (calc) — ABNORMAL HIGH (ref <130)
Total CHOL/HDL Ratio: 5.6 (calc) — ABNORMAL HIGH (ref <5.0)
Triglycerides: 172 mg/dL — ABNORMAL HIGH (ref <150)

## 2019-12-10 LAB — HEMOGLOBIN A1C
Hgb A1c MFr Bld: 7.4 % of total Hgb — ABNORMAL HIGH (ref <5.7)
Mean Plasma Glucose: 166 (calc)
eAG (mmol/L): 9.2 (calc)

## 2019-12-10 LAB — MAGNESIUM: Magnesium: 1.9 mg/dL (ref 1.5–2.5)

## 2019-12-11 ENCOUNTER — Other Ambulatory Visit: Payer: Self-pay | Admitting: Orthopaedic Surgery

## 2019-12-11 DIAGNOSIS — M25511 Pain in right shoulder: Secondary | ICD-10-CM

## 2020-01-06 ENCOUNTER — Ambulatory Visit
Admission: RE | Admit: 2020-01-06 | Discharge: 2020-01-06 | Disposition: A | Discharge status: Home / Self Care | Payer: 59 | Source: Ambulatory Visit | Attending: Orthopaedic Surgery | Admitting: Orthopaedic Surgery

## 2020-01-06 ENCOUNTER — Ambulatory Visit: Payer: 59 | Admitting: *Deleted

## 2020-01-06 ENCOUNTER — Other Ambulatory Visit: Payer: Self-pay | Admitting: Adult Health

## 2020-01-06 ENCOUNTER — Other Ambulatory Visit: Payer: Self-pay

## 2020-01-06 VITALS — BP 148/98 | HR 63 | Temp 97.0°F | Resp 16 | Wt 262.2 lb

## 2020-01-06 DIAGNOSIS — I10 Essential (primary) hypertension: Secondary | ICD-10-CM | POA: Diagnosis not present

## 2020-01-06 DIAGNOSIS — M25511 Pain in right shoulder: Secondary | ICD-10-CM

## 2020-01-06 MED ORDER — OLMESARTAN MEDOXOMIL 40 MG PO TABS: 40.0000 mg | ORAL_TABLET | Freq: Every day | ORAL | 1 refills | Status: DC

## 2020-01-06 NOTE — Addendum Note (Signed)
Addended by: Dan Maker on: 01/06/2020 01:18 PM   Modules accepted: Level of Service

## 2020-01-06 NOTE — Progress Notes (Addendum)
Bp remains above goal; D/c lisinopril, sent in olmesartan 40 mg daily to start taking. Recheck BP with BMP/GFR in 1 month. Given OTC decongestant options safe for htn.   Medicines you can use  Nasal congestion  Little Remedies saline spray (aerosol/mist)- can try this, it is in the kids section -Dextormethorphan + chlorpheniramine (Coridcidin HBP)- okay if you have high blood pressure -Oxymetazoline (Afrin) nasal spray- LIMIT to 3 days -Saline nasal spray -Neti pot (used distilled or bottled water)

## 2020-01-06 NOTE — Progress Notes (Signed)
Patient is here for a NV to recheck his BP. Patient has been taking Lisinopril 40 mg daily and blood pressure has ranged from 1335/71 to a high of 143/83. Today in the office, his BP is 148/98. Patient also complained of sinus pressure and asked what he can take OTC for sinus, that will not raise his BP.

## 2020-02-04 ENCOUNTER — Other Ambulatory Visit: Payer: Self-pay | Admitting: Internal Medicine

## 2020-02-04 MED ORDER — OMEPRAZOLE 40 MG PO CPDR: DELAYED_RELEASE_CAPSULE | ORAL | 0 refills | Status: DC

## 2020-02-10 ENCOUNTER — Other Ambulatory Visit: Payer: Self-pay

## 2020-02-10 ENCOUNTER — Ambulatory Visit (INDEPENDENT_AMBULATORY_CARE_PROVIDER_SITE_OTHER): Payer: 59

## 2020-02-10 VITALS — BP 140/92 | HR 70 | Temp 97.3°F | Wt 261.0 lb

## 2020-02-10 DIAGNOSIS — I10 Essential (primary) hypertension: Secondary | ICD-10-CM | POA: Diagnosis not present

## 2020-02-10 NOTE — Progress Notes (Signed)
Patient presents to the office for a BP check and to have a BMP/GFR done. Patient states that he is taking Olmesartan, 40mg  daily. Has been checking at home with the highest being 144/83 and lowest being 139/79. Today is the office, his BP was 140/92. No questions or concerns. Labs ordered and vitals taken and recorded.

## 2020-02-11 ENCOUNTER — Other Ambulatory Visit: Payer: Self-pay | Admitting: Internal Medicine

## 2020-02-11 ENCOUNTER — Other Ambulatory Visit: Payer: Self-pay | Admitting: Adult Health

## 2020-02-11 LAB — BASIC METABOLIC PANEL WITH GFR
BUN: 13 mg/dL (ref 7–25)
CO2: 27 mmol/L (ref 20–32)
Calcium: 10 mg/dL (ref 8.6–10.3)
Chloride: 105 mmol/L (ref 98–110)
Creat: 0.89 mg/dL (ref 0.70–1.25)
GFR, Est African American: 106 mL/min/{1.73_m2} (ref >=60)
GFR, Est Non African American: 92 mL/min/{1.73_m2} (ref >=60)
Glucose, Bld: 225 mg/dL — ABNORMAL HIGH (ref 65–99)
Potassium: 4.6 mmol/L (ref 3.5–5.3)
Sodium: 141 mmol/L (ref 135–146)

## 2020-02-11 MED ORDER — AMLODIPINE BESYLATE 10 MG PO TABS: ORAL_TABLET | ORAL | 0 refills | Status: DC

## 2020-02-11 MED ORDER — HYDROCHLOROTHIAZIDE 12.5 MG PO TABS
12.5000 mg | ORAL_TABLET | Freq: Every day | ORAL | 0 refills | Status: DC
Start: 2020-02-11 — End: 2020-04-07

## 2020-02-11 MED ORDER — MONTELUKAST SODIUM 10 MG PO TABS: ORAL_TABLET | ORAL | 0 refills | Status: DC

## 2020-02-22 ENCOUNTER — Other Ambulatory Visit: Payer: Self-pay | Admitting: Internal Medicine

## 2020-02-22 DIAGNOSIS — M5412 Radiculopathy, cervical region: Secondary | ICD-10-CM

## 2020-02-22 MED ORDER — CELECOXIB 200 MG PO CAPS: ORAL_CAPSULE | ORAL | 0 refills | Status: DC

## 2020-03-02 ENCOUNTER — Other Ambulatory Visit: Payer: Self-pay | Admitting: Orthopaedic Surgery

## 2020-03-17 ENCOUNTER — Encounter (HOSPITAL_COMMUNITY): Payer: Self-pay

## 2020-03-17 NOTE — Patient Instructions (Addendum)
DUE TO COVID-19 ONLY ONE VISITOR IS ALLOWED TO COME WITH YOU AND STAY IN THE WAITING ROOM ONLY DURING PRE OP AND PROCEDURE DAY OF SURGERY. THE 1 VISITOR  MAY VISIT WITH YOU AFTER SURGERY IN YOUR PRIVATE ROOM DURING VISITING HOURS ONLY!  YOU NEED TO HAVE A COVID 19 TEST ON: 03-27-20  @ 11:00 AM , THIS TEST MUST BE DONE BEFORE SURGERY,  COVID TESTING SITE [address] [place] [address], IT IS ON THE RIGHT GOING OUT [address] APPROXIMATELY  2 MINUTES PAST [employer] ON THE RIGHT. ONCE YOUR COVID TEST IS COMPLETED,  PLEASE BEGIN THE QUARANTINE INSTRUCTIONS AS OUTLINED IN YOUR HANDOUT.                John Andrade   Your procedure is scheduled on: 03-31-20   Report to [facility] Main  Entrance   Report to admitting at : 10:15 AM     Call this number if you have problems the morning of surgery [phone]    Remember:  NO SOLID FOOD AFTER MIDNIGHT THE NIGHT PRIOR TO SURGERY. NOTHING BY MOUTH EXCEPT CLEAR LIQUIDS UNTIL     09:15 am   PLEASE FINISH G2  DRINK PER SURGEON ORDER  WHICH NEEDS TO BE COMPLETED AT :  09:15 am then nothing by mouth.  CLEAR LIQUID DIET  Foods Allowed                                                                     Foods Excluded  Coffee and tea, regular and decaf                             liquids that you cannot  Plain Jell-O any favor except red or purple                                           see through such as: Fruit ices (not with fruit pulp)                                     milk, soups, orange juice  Iced Popsicles                                    All solid food Carbonated beverages, regular and diet                                    Cranberry, grape and apple juices Sports drinks like Gatorade Lightly seasoned clear broth or consume(fat free) Sugar, honey syrup  Sample Menu Breakfast                                Lunch  Supper Cranberry juice                    Beef broth                             Chicken broth Jell-O                                     Grape juice                           Apple juice Coffee or tea                        Jell-O                                      Popsicle                                                Coffee or tea                        Coffee or tea  _____________________________________________________________________  BRUSH YOUR TEETH MORNING OF SURGERY AND RINSE YOUR MOUTH OUT, NO CHEWING GUM CANDY OR MINTS.    Take these medicines the morning of surgery with A SIP OF WATER: omeprazole,loratadine,amlodipine                               You may not have any metal on your body including hair pins and              piercings  Do not wear jewelry, make-up, lotions, powders or perfumes, deodorant             Men may shave face and neck.   Do not bring valuables to the hospital. [facility] IS NOT             RESPONSIBLE   FOR VALUABLES.  Contacts, dentures or bridgework may not be worn into surgery.   Patients discharged the day of surgery will not be allowed to drive home. IF YOU ARE HAVING SURGERY AND GOING HOME THE SAME DAY, YOU MUST HAVE AN ADULT TO DRIVE YOU HOME AND BE WITH YOU FOR 24 HOURS. YOU MAY GO HOME BY TAXI OR UBER OR ORTHERWISE, BUT AN ADULT MUST ACCOMPANY YOU HOME AND STAY WITH YOU FOR 24 HOURS.  Name and phone number of your driver:  Special Instructions: N/A              Please read over the following fact sheets you were given: _____________________________________________________________________       [facility] - Preparing for Surgery Before surgery, you can play an important role.  Because skin is not sterile, your skin needs to be as free of germs as possible.  You can reduce the number of germs on your skin by washing with CHG (chlorahexidine gluconate) soap before surgery.  CHG is an antiseptic cleaner which kills germs and bonds with the skin to continue  killing germs even after washing. Please DO  NOT use if you have an allergy to CHG or antibacterial soaps.  If your skin becomes reddened/irritated stop using the CHG and inform your nurse when you arrive at Short Stay. Do not shave (including legs and underarms) for at least 48 hours prior to the first CHG shower.  You may shave your face/neck. Please follow these instructions carefully:  1.  Shower with CHG Soap the night before surgery and the  morning of Surgery.  2.  If you choose to wash your hair, wash your hair first as usual with your  normal  shampoo.  3.  After you shampoo, rinse your hair and body thoroughly to remove the  shampoo.                           4.  Use CHG as you would any other liquid soap.  You can apply chg directly  to the skin and wash                       Gently with a scrungie or clean washcloth.  5.  Apply the CHG Soap to your body ONLY FROM THE NECK DOWN.   Do not use on face/ open                           Wound or open sores. Avoid contact with eyes, ears mouth and genitals (private parts).                       Wash face,  Genitals (private parts) with your normal soap.             6.  Wash thoroughly, paying special attention to the area where your surgery  will be performed.  7.  Thoroughly rinse your body with warm water from the neck down.  8.  DO NOT shower/wash with your normal soap after using and rinsing off  the CHG Soap.                9.  Pat yourself dry with a clean towel.            10.  Wear clean pajamas.            11.  Place clean sheets on your bed the night of your first shower and do not  sleep with pets. Day of Surgery : Do not apply any lotions/deodorants the morning of surgery.  Please wear clean clothes to the hospital/surgery center.  FAILURE TO FOLLOW THESE INSTRUCTIONS MAY RESULT IN THE CANCELLATION OF YOUR SURGERY PATIENT SIGNATURE_________________________________  NURSE  SIGNATURE__________________________________  ________________________________________________________________________   John Andrade  An incentive spirometer is a tool that can help keep your lungs clear and active. This tool measures how well you are filling your lungs with each breath. Taking long deep breaths may help reverse or decrease the chance of developing breathing (pulmonary) problems (especially infection) following:  A long period of time when you are unable to move or be active. BEFORE THE PROCEDURE   If the spirometer includes an indicator to show your best effort, your nurse or respiratory therapist will set it to a desired goal.  If possible, sit up straight or lean slightly forward. Try not to slouch.  Hold the incentive spirometer in an upright position. INSTRUCTIONS FOR USE  1. Sit  on the edge of your bed if possible, or sit up as far as you can in bed or on a chair. 2. Hold the incentive spirometer in an upright position. 3. Breathe out normally. 4. Place the mouthpiece in your mouth and seal your lips tightly around it. 5. Breathe in slowly and as deeply as possible, raising the piston or the ball toward the top of the column. 6. Hold your breath for 3-5 seconds or for as long as possible. Allow the piston or ball to fall to the bottom of the column. 7. Remove the mouthpiece from your mouth and breathe out normally. 8. Rest for a few seconds and repeat Steps 1 through 7 at least 10 times every 1-2 hours when you are awake. Take your time and take a few normal breaths between deep breaths. 9. The spirometer may include an indicator to show your best effort. Use the indicator as a goal to work toward during each repetition. 10. After each set of 10 deep breaths, practice coughing to be sure your lungs are clear. If you have an incision (the cut made at the time of surgery), support your incision when coughing by placing a pillow or rolled up towels firmly  against it. Once you are able to get out of bed, walk around indoors and cough well. You may stop using the incentive spirometer when instructed by your caregiver.  RISKS AND COMPLICATIONS  Take your time so you do not get dizzy or light-headed.  If you are in pain, you may need to take or ask for pain medication before doing incentive spirometry. It is harder to take a deep breath if you are having pain. AFTER USE  Rest and breathe slowly and easily.  It can be helpful to keep track of a log of your progress. Your caregiver can provide you with a simple table to help with this. If you are using the spirometer at home, follow these instructions: SEEK MEDICAL CARE IF:   You are having difficultly using the spirometer.  You have trouble using the spirometer as often as instructed.  Your pain medication is not giving enough relief while using the spirometer.  You develop fever of 100.5 F (38.1 C) or higher. SEEK IMMEDIATE MEDICAL CARE IF:   You cough up bloody sputum that had not been present before.  You develop fever of 102 F (38.9 C) or greater.  You develop worsening pain at or near the incision site. MAKE SURE YOU:   Understand these instructions.  Will watch your condition.  Will get help right away if you are not doing well or get worse. Document Released: 06/20/2006 Document Revised: 05/02/2011 Document Reviewed: 08/21/2006 [facility] Patient Information 2014 [place], [place].

## 2020-03-25 ENCOUNTER — Encounter (HOSPITAL_COMMUNITY): Payer: Self-pay

## 2020-03-25 ENCOUNTER — Other Ambulatory Visit: Payer: Self-pay

## 2020-03-25 ENCOUNTER — Encounter (HOSPITAL_COMMUNITY)
Admission: RE | Admit: 2020-03-25 | Discharge: 2020-03-25 | Disposition: A | Discharge status: Home / Self Care | Payer: 59 | Source: Ambulatory Visit | Attending: Orthopaedic Surgery | Admitting: Orthopaedic Surgery

## 2020-03-25 DIAGNOSIS — Z01812 Encounter for preprocedural laboratory examination: Secondary | ICD-10-CM | POA: Insufficient documentation

## 2020-03-25 LAB — CBC
HCT: 45.9 % (ref 39.0–52.0)
Hemoglobin: 15.2 g/dL (ref 13.0–17.0)
MCH: 28.5 pg (ref 26.0–34.0)
MCHC: 33.1 g/dL (ref 30.0–36.0)
MCV: 86.1 fL (ref 80.0–100.0)
Platelets: 302 10*3/uL (ref 150–400)
RBC: 5.33 MIL/uL (ref 4.22–5.81)
RDW: 12.1 % (ref 11.5–15.5)
WBC: 7.6 10*3/uL (ref 4.0–10.5)
nRBC: 0 % (ref 0.0–0.2)

## 2020-03-25 LAB — BASIC METABOLIC PANEL
Anion gap: 11 (ref 5–15)
BUN: 20 mg/dL (ref 8–23)
CO2: 24 mmol/L (ref 22–32)
Calcium: 9.5 mg/dL (ref 8.9–10.3)
Chloride: 102 mmol/L (ref 98–111)
Creatinine, Ser: 0.89 mg/dL (ref 0.61–1.24)
GFR, Estimated: >60 mL/min (ref >60)
Glucose, Bld: 285 mg/dL — ABNORMAL HIGH (ref 70–99)
Potassium: 4 mmol/L (ref 3.5–5.1)
Sodium: 137 mmol/L (ref 135–145)

## 2020-03-25 LAB — HEMOGLOBIN A1C
Hgb A1c MFr Bld: 7.8 % — ABNORMAL HIGH (ref 4.8–5.6)
Mean Plasma Glucose: 177.16 mg/dL

## 2020-03-25 NOTE — Progress Notes (Signed)
COVID Vaccine Completed: NO Date COVID Vaccine completed: COVID vaccine manufacturer: Pfizer    [employer]   PCP - Dr. Lucky Cowboy Cardiologist - NO  Chest x-ray -  EKG - 09/17/19 Stress Test -  ECHO -  Cardiac Cath - 2007 Pacemaker/ICD device last checked:  Sleep Study -  CPAP -   Fasting Blood Sugar -  Checks Blood Sugar _____ times a day  Blood Thinner Instructions: Aspirin Instructions: Last Dose:  Anesthesia review: Hx: HTN,  Patient denies shortness of breath, fever, cough and chest pain at PAT appointment   Patient verbalized understanding of instructions that were given to them at the PAT appointment. Patient was also instructed that they will need to review over the PAT instructions again at home before surgery.

## 2020-03-25 NOTE — Progress Notes (Signed)
Lab. Report: Glucose 285.

## 2020-03-26 ENCOUNTER — Ambulatory Visit: Payer: 59 | Admitting: Adult Health

## 2020-03-27 ENCOUNTER — Other Ambulatory Visit (HOSPITAL_COMMUNITY)
Admission: RE | Admit: 2020-03-27 | Discharge: 2020-03-27 | Disposition: A | Discharge status: Home / Self Care | Payer: 59 | Source: Ambulatory Visit | Attending: Orthopaedic Surgery | Admitting: Orthopaedic Surgery

## 2020-03-27 DIAGNOSIS — U071 COVID-19: Secondary | ICD-10-CM | POA: Diagnosis not present

## 2020-03-27 DIAGNOSIS — Z01812 Encounter for preprocedural laboratory examination: Secondary | ICD-10-CM | POA: Diagnosis present

## 2020-03-27 LAB — SARS CORONAVIRUS 2 (TAT 6-24 HRS): SARS Coronavirus 2: POSITIVE — AB

## 2020-03-28 NOTE — Progress Notes (Signed)
Dr. Janee Morn called back and + covid results relayed.

## 2020-03-28 NOTE — Progress Notes (Signed)
Called Danielle from the on-call service of Dr. Christain Sacramento with + covid results for this pt. The pt is to have surgery on Tues 2/8 at [facility] at 1201. The pt has not been notified as there will be pertinent questions the provider needs to answer.  These are the current guidelines:  Positive Results for:  Asymptomatic: Procedure postponed and quarantined for 10 days, unless the procedure is urgent. Symptomatic: Procedure postponed and quarantined for 14 days. Hospitalized with Covid: postponed and quarantined  for 21 days. Immunocompromised: Procedure postponed and quarantine for 20 days.  The pt will not be retested for 90 days from the + result.

## 2020-03-30 ENCOUNTER — Other Ambulatory Visit: Payer: Self-pay | Admitting: Orthopaedic Surgery

## 2020-03-30 MED ORDER — TRANEXAMIC ACID 1000 MG/10ML IV SOLN
2000.0000 mg | INTRAVENOUS | Status: AC
  Filled 2020-03-30: qty 20

## 2020-04-06 ENCOUNTER — Encounter (HOSPITAL_BASED_OUTPATIENT_CLINIC_OR_DEPARTMENT_OTHER): Payer: Self-pay | Admitting: Orthopaedic Surgery

## 2020-04-06 ENCOUNTER — Other Ambulatory Visit: Payer: Self-pay

## 2020-04-07 ENCOUNTER — Other Ambulatory Visit: Payer: Self-pay | Admitting: Adult Health

## 2020-04-09 NOTE — H&P (Signed)
John Andrade is an 65 y.o. male.   Chief Complaint: Right Shoulder HPI: John Andrade has been through his shoulder MRI scan.  He persists with terrible pain just doing things like drinking coffee or reaching across his chest or out to the side.  He has trouble sleeping at night.  He would like to get this fixed if possible.  MRI:  I reviewed an MRI scan films and report of a study done at [facility] on 01/06/20.  This shows large retracted tears of the supraspinatus and infraspinatus with significant muscle atrophy.  The biceps tendon is also partially torn.  They read just mild degenerative change.   Past Medical History:  Diagnosis Date  . COVID-19 03/18/2019   Jan 6th 2021  . DDD lumbar   . DJD (degenerative joint disease)   . Dysrhythmia    Trigeminy  . GERD (gastroesophageal reflux disease)    takes Omeprazole daily  . History of kidney stones   . Hyperlipidemia   . Hypertension    takes Lisinopril and Amlodipine daily  . Multiple allergies    takes Claritin and Singulair daily  . PONV (postoperative nausea and vomiting)    with his longer surgeries  . Pre-diabetes    Not on any medications  . Vertigo 10/15/2019   peripheral vertigo last episode was in 2019    Past Surgical History:  Procedure Laterality Date  . ANKLE FRACTURE SURGERY  1995   right  . BACK SURGERY  2002   lumb lam-fusion  . BACK SURGERY  03-2014   Dr Channing Mutters in [place]  . CARDIAC CATHETERIZATION     2007  . CARPAL TUNNEL RELEASE Right 09/24/2019   Procedure: RIGHT CARPAL TUNNEL RELEASE;  Surgeon: Marcene Corning, MD;  Location: [facility];  Service: Orthopedics;  Laterality: Right;  . CARPAL TUNNEL RELEASE Left 10/22/2019   Procedure: LEFT CARPAL TUNNEL RELEASE;  Surgeon: Marcene Corning, MD;  Location: [facility];  Service: Orthopedics;  Laterality: Left;  . CHONDROPLASTY Left 10/28/2014   Procedure: CHONDROPLASTY;  Surgeon: Marcene Corning, MD;  Location: [facility];  Service: Orthopedics;  Laterality:  Left;  . COLONOSCOPY    . ELBOW LIGAMENT RECONSTRUCTION Right 2015   [place]  . KNEE ARTHROSCOPY Left 04/24/2012   Procedure: ARTHROSCOPY KNEE, PARTIAL MEDIAL MENISECTOMY AND CHONDROPLASTY;  Surgeon: [name], MD;  Location: [facility];  Service: Orthopedics;  Laterality: Left;  . KNEE ARTHROSCOPY WITH MEDIAL MENISECTOMY Left 10/28/2014   Procedure: LEFT ARTHROSCOPY KNEE, PARTIAL MEDIAL MENISECTOMY, CHONDROPLASTY;  Surgeon: Marcene Corning, MD;  Location: [facility];  Service: Orthopedics;  Laterality: Left;  . TOTAL HIP ARTHROPLASTY Right 09/22/2015   Procedure: TOTAL HIP ARTHROPLASTY ANTERIOR APPROACH;  Surgeon: Marcene Corning, MD;  Location: [facility];  Service: Orthopedics;  Laterality: Right;  . TOTAL HIP ARTHROPLASTY Left 01/05/2016   Procedure: LEFT TOTAL HIP ARTHROPLASTY ANTERIOR APPROACH;  Surgeon: Marcene Corning, MD;  Location: [facility];  Service: Orthopedics;  Laterality: Left;  . TRIGGER FINGER RELEASE Right 09/24/2019   Procedure: RIGHT RELEASE TRIGGER FINGER/A-1 PULLEY;  Surgeon: Marcene Corning, MD;  Location: [facility];  Service: Orthopedics;  Laterality: Right;  . WISDOM TOOTH EXTRACTION      Family History  Problem Relation Age of Onset  . Hypertension Mother   . Dementia Mother   . Cancer Father   . Hypertension Father   . Heart disease Father   . Hyperlipidemia Father   . Diabetes Sister   . Cancer  Maternal Grandmother        lung   Social History:  reports that he quit smoking about 45 years ago. He has never used smokeless tobacco. He reports that he does not drink alcohol and does not use drugs.  Allergies:  Allergies  Allergen Reactions  . Statins Other (See Comments)    Flu-like symptoms  . Tylenol [Acetaminophen]     Liver enzymes elevated so patient has stopped taking anything with acetaminophen in it  . Phentermine Other (See Comments)    constipation    No medications prior to admission.    No results found for this or any  previous visit (from the past 48 hour(s)). No results found.  Review of Systems  Musculoskeletal: Positive for arthralgias.       Right shoulder  All other systems reviewed and are negative.   Height 5\' 6"  (1.676 m), weight 118.4 kg. Physical Exam Constitutional:      Appearance: Normal appearance.  HENT:     Head: Normocephalic and atraumatic.     Mouth/Throat:     Pharynx: Oropharynx is clear.  Eyes:     Extraocular Movements: Extraocular movements intact.  Cardiovascular:     Rate and Rhythm: Normal rate and regular rhythm.     Pulses: Normal pulses.  Pulmonary:     Effort: Pulmonary effort is normal.  Abdominal:     Palpations: Abdomen is soft.  Musculoskeletal:     Cervical back: Normal range of motion.     Comments: Right shoulder motion is nearly full.  He has a painful arc bringing his arm up and down.  He has some weakness to cuff strength testing which is painful.  He has some pain at his [facility] joint.  Sensation and motor function are intact in his hands with palpable pulses.  His carpal tunnel incisions are well-healed.    Skin:    General: Skin is warm and dry.  Neurological:     Mental Status: He is alert and oriented to person, place, and time.  Psychiatric:        Mood and Affect: Mood normal.        Behavior: Behavior normal.      Assessment/Plan: Right shoulder massive rotator cuff tear by MRI 2021 injected most recently 08/14/19  Plan: 08/16/19 has some terrible pain.  I do not think we can really repair this rotator cuff and his best option at this point surgically would be a simple debridement with aggressive physical therapy thereafter.  I reviewed risk of anesthesia, infection, DVT related to an acromioplasty, AC resection, and debridement.  We will probably also perform a biceps tenolysis.  He would like to get this done before the new year though I doubt that is going to be possible.   Harding Thomure Arcelia Pals, PA-C 04/09/2020, 4:38 PM

## 2020-04-10 ENCOUNTER — Encounter (HOSPITAL_BASED_OUTPATIENT_CLINIC_OR_DEPARTMENT_OTHER)
Admission: RE | Admit: 2020-04-10 | Discharge: 2020-04-10 | Disposition: A | Discharge status: Home / Self Care | Payer: 59 | Source: Ambulatory Visit | Attending: Orthopaedic Surgery | Admitting: Orthopaedic Surgery

## 2020-04-10 DIAGNOSIS — Z01812 Encounter for preprocedural laboratory examination: Secondary | ICD-10-CM | POA: Diagnosis present

## 2020-04-10 LAB — BASIC METABOLIC PANEL
Anion gap: 10 (ref 5–15)
BUN: 16 mg/dL (ref 8–23)
CO2: 22 mmol/L (ref 22–32)
Calcium: 9.4 mg/dL (ref 8.9–10.3)
Chloride: 107 mmol/L (ref 98–111)
Creatinine, Ser: 0.97 mg/dL (ref 0.61–1.24)
GFR, Estimated: >60 mL/min (ref >60)
Glucose, Bld: 171 mg/dL — ABNORMAL HIGH (ref 70–99)
Potassium: 4.3 mmol/L (ref 3.5–5.1)
Sodium: 139 mmol/L (ref 135–145)

## 2020-04-10 NOTE — Progress Notes (Signed)

## 2020-04-13 ENCOUNTER — Ambulatory Visit (HOSPITAL_BASED_OUTPATIENT_CLINIC_OR_DEPARTMENT_OTHER): Payer: 59 | Admitting: Physician Assistant

## 2020-04-13 ENCOUNTER — Encounter (HOSPITAL_BASED_OUTPATIENT_CLINIC_OR_DEPARTMENT_OTHER): Payer: Self-pay | Admitting: Orthopaedic Surgery

## 2020-04-13 ENCOUNTER — Other Ambulatory Visit: Payer: Self-pay

## 2020-04-13 ENCOUNTER — Encounter (HOSPITAL_BASED_OUTPATIENT_CLINIC_OR_DEPARTMENT_OTHER)
Admission: RE | Disposition: A | Discharge status: Home / Self Care | Payer: Self-pay | Source: Home / Self Care | Attending: Orthopaedic Surgery

## 2020-04-13 ENCOUNTER — Ambulatory Visit (HOSPITAL_BASED_OUTPATIENT_CLINIC_OR_DEPARTMENT_OTHER)
Admission: RE | Admit: 2020-04-13 | Discharge: 2020-04-13 | Disposition: A | Discharge status: Home / Self Care | Payer: 59 | Attending: Orthopaedic Surgery | Admitting: Orthopaedic Surgery

## 2020-04-13 ENCOUNTER — Ambulatory Visit (HOSPITAL_BASED_OUTPATIENT_CLINIC_OR_DEPARTMENT_OTHER): Payer: 59 | Admitting: Anesthesiology

## 2020-04-13 DIAGNOSIS — S46211A Strain of muscle, fascia and tendon of other parts of biceps, right arm, initial encounter: Secondary | ICD-10-CM | POA: Insufficient documentation

## 2020-04-13 DIAGNOSIS — Z886 Allergy status to analgesic agent status: Secondary | ICD-10-CM | POA: Diagnosis not present

## 2020-04-13 DIAGNOSIS — M75101 Unspecified rotator cuff tear or rupture of right shoulder, not specified as traumatic: Secondary | ICD-10-CM | POA: Insufficient documentation

## 2020-04-13 DIAGNOSIS — Z8616 Personal history of COVID-19: Secondary | ICD-10-CM | POA: Insufficient documentation

## 2020-04-13 DIAGNOSIS — Z888 Allergy status to other drugs, medicaments and biological substances status: Secondary | ICD-10-CM | POA: Insufficient documentation

## 2020-04-13 DIAGNOSIS — X58XXXA Exposure to other specified factors, initial encounter: Secondary | ICD-10-CM | POA: Diagnosis not present

## 2020-04-13 DIAGNOSIS — Z87891 Personal history of nicotine dependence: Secondary | ICD-10-CM | POA: Insufficient documentation

## 2020-04-13 HISTORY — PX: SHOULDER ARTHROSCOPY WITH BICEPSTENOTOMY: SHX6204

## 2020-04-13 SURGERY — SHOULDER ARTHROSCOPY WITH SUBACROMIAL DECOMPRESSION AND DISTAL CLAVICLE EXCISION
Anesthesia: General | Site: Shoulder | Laterality: Right

## 2020-04-13 MED ORDER — TRANEXAMIC ACID-NACL 1000-0.7 MG/100ML-% IV SOLN: 1000.0000 mg | INTRAVENOUS | Status: DC

## 2020-04-13 MED ORDER — BUPIVACAINE HCL (PF) 0.5 % IJ SOLN
INTRAMUSCULAR | Status: DC | PRN
  Administered 2020-04-13: 20 mL via PERINEURAL

## 2020-04-13 MED ORDER — CEFAZOLIN SODIUM-DEXTROSE 2-4 GM/100ML-% IV SOLN
INTRAVENOUS | Status: AC
  Filled 2020-04-13: qty 100

## 2020-04-13 MED ORDER — CEFAZOLIN SODIUM-DEXTROSE 2-4 GM/100ML-% IV SOLN: 2.0000 g | INTRAVENOUS | Status: DC

## 2020-04-13 MED ORDER — DEXAMETHASONE SODIUM PHOSPHATE 10 MG/ML IJ SOLN
INTRAMUSCULAR | Status: DC | PRN
  Administered 2020-04-13: 5 mg via INTRAVENOUS

## 2020-04-13 MED ORDER — OXYCODONE HCL 5 MG PO TABS: 5.0000 mg | ORAL_TABLET | Freq: Four times a day (QID) | ORAL | 0 refills | Status: AC | PRN

## 2020-04-13 MED ORDER — PHENYLEPHRINE HCL (PRESSORS) 10 MG/ML IV SOLN
INTRAVENOUS | Status: AC
  Filled 2020-04-13: qty 1

## 2020-04-13 MED ORDER — BUPIVACAINE LIPOSOME 1.3 % IJ SUSP
INTRAMUSCULAR | Status: DC | PRN
  Administered 2020-04-13: 10 mL via PERINEURAL

## 2020-04-13 MED ORDER — POVIDONE-IODINE 10 % EX SWAB: 2.0000 "application " | Freq: Once | CUTANEOUS | Status: DC

## 2020-04-13 MED ORDER — LACTATED RINGERS IV SOLN: INTRAVENOUS | Status: DC

## 2020-04-13 MED ORDER — PROPOFOL 10 MG/ML IV BOLUS
INTRAVENOUS | Status: DC | PRN
  Administered 2020-04-13: 200 mg via INTRAVENOUS

## 2020-04-13 MED ORDER — ONDANSETRON HCL 4 MG/2ML IJ SOLN
INTRAMUSCULAR | Status: DC | PRN
  Administered 2020-04-13: 4 mg via INTRAVENOUS

## 2020-04-13 MED ORDER — LIDOCAINE HCL (CARDIAC) PF 100 MG/5ML IV SOSY
PREFILLED_SYRINGE | INTRAVENOUS | Status: DC | PRN
  Administered 2020-04-13: 60 mg via INTRATRACHEAL

## 2020-04-13 MED ORDER — FENTANYL CITRATE (PF) 100 MCG/2ML IJ SOLN
50.0000 ug | Freq: Once | INTRAMUSCULAR | Status: AC
  Administered 2020-04-13: 100 ug via INTRAVENOUS

## 2020-04-13 MED ORDER — ONDANSETRON HCL 4 MG/2ML IJ SOLN
INTRAMUSCULAR | Status: AC
  Filled 2020-04-13: qty 2

## 2020-04-13 MED ORDER — MIDAZOLAM HCL 2 MG/2ML IJ SOLN
INTRAMUSCULAR | Status: AC
  Filled 2020-04-13: qty 2

## 2020-04-13 MED ORDER — TRANEXAMIC ACID-NACL 1000-0.7 MG/100ML-% IV SOLN
INTRAVENOUS | Status: AC
  Filled 2020-04-13: qty 100

## 2020-04-13 MED ORDER — FENTANYL CITRATE (PF) 100 MCG/2ML IJ SOLN
INTRAMUSCULAR | Status: AC
  Filled 2020-04-13: qty 2

## 2020-04-13 MED ORDER — PHENYLEPHRINE HCL-NACL 10-0.9 MG/250ML-% IV SOLN
INTRAVENOUS | Status: DC | PRN
  Administered 2020-04-13: 50 ug/min via INTRAVENOUS

## 2020-04-13 MED ORDER — FENTANYL CITRATE (PF) 100 MCG/2ML IJ SOLN
INTRAMUSCULAR | Status: DC | PRN
  Administered 2020-04-13: 25 ug via INTRAVENOUS

## 2020-04-13 MED ORDER — EPHEDRINE SULFATE 50 MG/ML IJ SOLN
INTRAMUSCULAR | Status: DC | PRN
  Administered 2020-04-13: 5 mg via INTRAVENOUS

## 2020-04-13 MED ORDER — PROPOFOL 10 MG/ML IV BOLUS
INTRAVENOUS | Status: AC
  Filled 2020-04-13: qty 20

## 2020-04-13 MED ORDER — CEFAZOLIN SODIUM-DEXTROSE 2-4 GM/100ML-% IV SOLN
2.0000 g | INTRAVENOUS | Status: AC
  Administered 2020-04-13: 2 g via INTRAVENOUS

## 2020-04-13 MED ORDER — POVIDONE-IODINE 10 % EX SWAB
2.0000 "application " | Freq: Once | CUTANEOUS | Status: AC
  Administered 2020-04-13: 2 via TOPICAL

## 2020-04-13 MED ORDER — MIDAZOLAM HCL 2 MG/2ML IJ SOLN
1.0000 mg | Freq: Once | INTRAMUSCULAR | Status: AC
  Administered 2020-04-13: 2 mg via INTRAVENOUS

## 2020-04-13 SURGICAL SUPPLY — 62 items
BENZOIN TINCTURE PRP APPL 2/3 (GAUZE/BANDAGES/DRESSINGS) IMPLANT
BLADE EXCALIBUR 4.0X13 (MISCELLANEOUS) ×2 IMPLANT
BLADE SURG 15 STRL LF DISP TIS (BLADE) IMPLANT
BLADE SURG 15 STRL SS (BLADE)
BURR OVAL 8 FLU 4.0X13 (MISCELLANEOUS) ×2 IMPLANT
CANNULA SHOULDER 7CM (CANNULA) ×2 IMPLANT
CANNULA TWIST IN 8.25X7CM (CANNULA) IMPLANT
COVER WAND RF STERILE (DRAPES) IMPLANT
DECANTER SPIKE VIAL GLASS SM (MISCELLANEOUS) IMPLANT
DERMABOND ADVANCED (GAUZE/BANDAGES/DRESSINGS)
DERMABOND ADVANCED .7 DNX12 (GAUZE/BANDAGES/DRESSINGS) IMPLANT
DRAPE STERI 35X30 U-POUCH (DRAPES) ×2 IMPLANT
DRAPE U-SHAPE 47X51 STRL (DRAPES) ×2 IMPLANT
DRAPE U-SHAPE 76X120 STRL (DRAPES) ×4 IMPLANT
DRSG EMULSION OIL 3X3 NADH (GAUZE/BANDAGES/DRESSINGS) ×2 IMPLANT
DRSG PAD ABDOMINAL 8X10 ST (GAUZE/BANDAGES/DRESSINGS) ×2 IMPLANT
DURAPREP 26ML APPLICATOR (WOUND CARE) ×2 IMPLANT
ELECT MENISCUS 165MM 90D (ELECTRODE) IMPLANT
ELECT REM PT RETURN 9FT ADLT (ELECTROSURGICAL)
ELECTRODE REM PT RTRN 9FT ADLT (ELECTROSURGICAL) IMPLANT
GLOVE SRG 8 PF TXTR STRL LF DI (GLOVE) ×2 IMPLANT
GLOVE SURG ENC MOIS LTX SZ8 (GLOVE) ×4 IMPLANT
GLOVE SURG LTX SZ6.5 (GLOVE) ×2 IMPLANT
GLOVE SURG UNDER POLY LF SZ7 (GLOVE) ×4 IMPLANT
GLOVE SURG UNDER POLY LF SZ8 (GLOVE) ×4
GOWN STRL REUS W/ TWL LRG LVL3 (GOWN DISPOSABLE) ×1 IMPLANT
GOWN STRL REUS W/ TWL XL LVL3 (GOWN DISPOSABLE) ×2 IMPLANT
GOWN STRL REUS W/TWL LRG LVL3 (GOWN DISPOSABLE) ×2
GOWN STRL REUS W/TWL XL LVL3 (GOWN DISPOSABLE) ×4
IV NS IRRIG 3000ML ARTHROMATIC (IV SOLUTION) ×8 IMPLANT
MANIFOLD NEPTUNE II (INSTRUMENTS) ×2 IMPLANT
NDL SUT 6 .5 CRC .975X.05 MAYO (NEEDLE) IMPLANT
NEEDLE MAYO TAPER (NEEDLE)
NEEDLE SCORPION MULTI FIRE (NEEDLE) IMPLANT
NS IRRIG 1000ML POUR BTL (IV SOLUTION) IMPLANT
PACK ARTHROSCOPY DSU (CUSTOM PROCEDURE TRAY) ×2 IMPLANT
PACK BASIN DAY SURGERY FS (CUSTOM PROCEDURE TRAY) ×2 IMPLANT
PASSER SUT SWANSON 36MM LOOP (INSTRUMENTS) IMPLANT
PENCIL SMOKE EVACUATOR (MISCELLANEOUS) IMPLANT
PORT APPOLLO RF 90DEGREE MULTI (SURGICAL WAND) ×2 IMPLANT
RESTRAINT HEAD UNIVERSAL NS (MISCELLANEOUS) ×2 IMPLANT
SHEET MEDIUM DRAPE 40X70 STRL (DRAPES) ×2 IMPLANT
SLEEVE SCD COMPRESS KNEE MED (MISCELLANEOUS) IMPLANT
SLING ARM FOAM STRAP LRG (SOFTGOODS) ×2 IMPLANT
SPONGE LAP 4X18 RFD (DISPOSABLE) IMPLANT
STRIP CLOSURE SKIN 1/2X4 (GAUZE/BANDAGES/DRESSINGS) IMPLANT
SUCTION FRAZIER HANDLE 10FR (MISCELLANEOUS)
SUCTION TUBE FRAZIER 10FR DISP (MISCELLANEOUS) IMPLANT
SUT ETHIBOND 2 OS 4 DA (SUTURE) IMPLANT
SUT ETHILON 3 0 PS 1 (SUTURE) ×2 IMPLANT
SUT FIBERWIRE #2 38 T-5 BLUE (SUTURE)
SUT PDS AB 2-0 CT2 27 (SUTURE) IMPLANT
SUT VIC AB 0 SH 27 (SUTURE) IMPLANT
SUT VIC AB 2-0 SH 27 (SUTURE)
SUT VIC AB 2-0 SH 27XBRD (SUTURE) IMPLANT
SUT VICRYL 4-0 PS2 18IN ABS (SUTURE) IMPLANT
SUTURE FIBERWR #2 38 T-5 BLUE (SUTURE) IMPLANT
SYR BULB EAR ULCER 3OZ GRN STR (SYRINGE) IMPLANT
TOWEL GREEN STERILE FF (TOWEL DISPOSABLE) ×2 IMPLANT
TUBING ARTHROSCOPY IRRIG 16FT (MISCELLANEOUS) ×2 IMPLANT
WATER STERILE IRR 1000ML POUR (IV SOLUTION) IMPLANT
YANKAUER SUCT BULB TIP NO VENT (SUCTIONS) IMPLANT

## 2020-04-13 NOTE — Anesthesia Preprocedure Evaluation (Addendum)
Anesthesia Evaluation  Patient identified by MRN, date of birth, ID band Patient awake    Reviewed: Allergy & Precautions, NPO status , Patient's Chart, lab work & pertinent test results  History of Anesthesia Complications (+) PONV and history of anesthetic complications  Airway Mallampati: III  TM Distance: >3 FB Neck ROM: Full    Dental no notable dental hx. (+) Teeth Intact, Dental Advisory Given   Pulmonary former smoker,  Quit smoking 1977 Has never had sleep study   Pulmonary exam normal breath sounds clear to auscultation       Cardiovascular hypertension, Pt. on medications Normal cardiovascular exam Rhythm:Regular Rate:Normal     Neuro/Psych vertigo negative neurological ROS  negative psych ROS   GI/Hepatic Neg liver ROS, GERD  Medicated and Controlled,  Endo/Other  diabetes (pre-diabetic, increasing a1c), Poorly ControlledMorbid obesityBMI 43 a1c 7.4  Renal/GU negative Renal ROS  negative genitourinary   Musculoskeletal  (+) Arthritis , Osteoarthritis,  Chronic pain   Abdominal (+) + obese,   Peds  Hematology negative hematology ROS (+)   Anesthesia Other Findings   Reproductive/Obstetrics negative OB ROS                             Anesthesia Physical  Anesthesia Plan  ASA: III  Anesthesia Plan: General   Post-op Pain Management:  Regional for Post-op pain   Induction: Intravenous  PONV Risk Score and Plan: 3 and Ondansetron, Dexamethasone, Midazolam and Treatment may vary due to age or medical condition  Airway Management Planned: LMA  Additional Equipment: None  Intra-op Plan:   Post-operative Plan: Extubation in OR  Informed Consent: I have reviewed the patients History and Physical, chart, labs and discussed the procedure including the risks, benefits and alternatives for the proposed anesthesia with the patient or authorized representative who has  indicated his/her understanding and acceptance.       Plan Discussed with: CRNA  Anesthesia Plan Comments:         Anesthesia Quick Evaluation

## 2020-04-13 NOTE — Progress Notes (Signed)
Assisted Dr. Miller with right, ultrasound guided, interscalene  block. Side rails up, monitors on throughout procedure. See vital signs in flow sheet. Tolerated Procedure well. 

## 2020-04-13 NOTE — Anesthesia Postprocedure Evaluation (Signed)
Anesthesia Post Note  Patient: John Andrade  Procedure(s) Performed: RIGHT SHOULDER ARTHROSCOPY, ACRMIOPLASTY, DISTAL CLAVICLE EXCISION, BICEPS TENOLYSIS (Right Shoulder) SHOULDER ARTHROSCOPY WITH BICEPSTENOTOMY (Right Shoulder)     Patient location during evaluation: PACU Anesthesia Type: General Level of consciousness: awake and alert Pain management: pain level controlled Vital Signs Assessment: post-procedure vital signs reviewed and stable Respiratory status: spontaneous breathing, nonlabored ventilation and respiratory function stable Cardiovascular status: blood pressure returned to baseline and stable Postop Assessment: no apparent nausea or vomiting Anesthetic complications: no   No complications documented.  Last Vitals:  Vitals:   04/13/20 1015 04/13/20 1045  BP: 129/76 134/78  Pulse: (!) 55 62  Resp: 12 18  Temp:  36.8 C  SpO2: 96%     Last Pain:  Vitals:   04/13/20 0714  TempSrc: Oral  PainSc: 3                  Lowella Curb

## 2020-04-13 NOTE — Brief Op Note (Signed)
04/13/2020 John Andrade [id number] 04/13/2020   PRE-OP DIAGNOSIS: right sh RCT and AC DJD  POST-OP DIAGNOSIS: same  PROCEDURE: sh scope deb, aply, AC  ANESTHESIA: general and block  [name]   Dictation #:  [id number]

## 2020-04-13 NOTE — Interval H&P Note (Signed)
History and Physical Interval Note:  04/13/2020 8:30 AM  John Andrade  has presented today for surgery, with the diagnosis of RIGHT SHOULDER ROTATOR CUFF TEAR AND ACROMIOCLAVICULAR JOINT DEGENERATIVE JOINT DISEASE.  The various methods of treatment have been discussed with the patient and family. After consideration of risks, benefits and other options for treatment, the patient has consented to  Procedure(s): RIGHT SHOULDER ARTHROSCOPY (Right) as a surgical intervention.  The patient's history has been reviewed, patient examined, no change in status, stable for surgery.  I have reviewed the patient's chart and labs.  Questions were answered to the patient's satisfaction.     [name]

## 2020-04-13 NOTE — Discharge Instructions (Signed)
Post Anesthesia Home Care Instructions  Activity: Get plenty of rest for the remainder of the day. A responsible individual must stay with you for 24 hours following the procedure.  For the next 24 hours, DO NOT: -Drive a car -Operate machinery -Drink alcoholic beverages -Take any medication unless instructed by your physician -Make any legal decisions or sign important papers.  Meals: Start with liquid foods such as gelatin or soup. Progress to regular foods as tolerated. Avoid greasy, spicy, heavy foods. If nausea and/or vomiting occur, drink only clear liquids until the nausea and/or vomiting subsides. Call your physician if vomiting continues.  Special Instructions/Symptoms: Your throat may feel dry or sore from the anesthesia or the breathing tube placed in your throat during surgery. If this causes discomfort, gargle with warm salt water. The discomfort should disappear within 24 hours.  If you had a scopolamine patch placed behind your ear for the management of post- operative nausea and/or vomiting:  1. The medication in the patch is effective for 72 hours, after which it should be removed.  Wrap patch in a tissue and discard in the trash. Wash hands thoroughly with soap and water. 2. You may remove the patch earlier than 72 hours if you experience unpleasant side effects which may include dry mouth, dizziness or visual disturbances. 3. Avoid touching the patch. Wash your hands with soap and water after contact with the patch.      Regional Anesthesia Blocks  1. Numbness or the inability to move the "blocked" extremity may last from 3-48 hours after placement. The length of time depends on the medication injected and your individual response to the medication. If the numbness is not going away after 48 hours, call your surgeon.  2. The extremity that is blocked will need to be protected until the numbness is gone and the  Strength has returned. Because you cannot feel it, you  will need to take extra care to avoid injury. Because it may be weak, you may have difficulty moving it or using it. You may not know what position it is in without looking at it while the block is in effect.  3. For blocks in the legs and feet, returning to weight bearing and walking needs to be done carefully. You will need to wait until the numbness is entirely gone and the strength has returned. You should be able to move your leg and foot normally before you try and bear weight or walk. You will need someone to be with you when you first try to ensure you do not fall and possibly risk injury.  4. Bruising and tenderness at the needle site are common side effects and will resolve in a few days.  5. Persistent numbness or new problems with movement should be communicated to the surgeon or the [facility] ([phone])/ [facility] ([phone]).  Information for Discharge Teaching: EXPAREL (bupivacaine liposome injectable suspension)   Your surgeon or anesthesiologist gave you EXPAREL(bupivacaine) to help control your pain after surgery.   EXPAREL is a local anesthetic that provides pain relief by numbing the tissue around the surgical site.  EXPAREL is designed to release pain medication over time and can control pain for up to 72 hours.  Depending on how you respond to EXPAREL, you may require less pain medication during your recovery.  Possible side effects:  Temporary loss of sensation or ability to move in the area where bupivacaine was injected.  Nausea, vomiting, constipation  Rarely,   numbness and tingling in your mouth or lips, lightheadedness, or anxiety may occur.  Call your doctor right away if you think you may be experiencing any of these sensations, or if you have other questions regarding possible side effects.  Follow all other discharge instructions given to you by your surgeon or nurse. Eat a healthy diet and drink plenty of water or other  fluids.  If you return to the hospital for any reason within 96 hours following the administration of EXPAREL, it is important for health care providers to know that you have received this anesthetic. A teal colored band has been placed on your arm with the date, time and amount of EXPAREL you have received in order to alert and inform your health care providers. Please leave this armband in place for the full 96 hours following administration, and then you may remove the band. 

## 2020-04-13 NOTE — Anesthesia Procedure Notes (Signed)
Procedure Name: LMA Insertion Date/Time: 04/13/2020 8:45 AM Performed by: Thornell Mule, CRNA Pre-anesthesia Checklist: Patient identified, Emergency Drugs available, Suction available and Patient being monitored Patient Re-evaluated:Patient Re-evaluated prior to induction Oxygen Delivery Method: Circle system utilized Preoxygenation: Pre-oxygenation with 100% oxygen Induction Type: IV induction LMA: LMA inserted LMA Size: 4.0 Number of attempts: 1 Placement Confirmation: positive ETCO2 Tube secured with: Tape Dental Injury: Teeth and Oropharynx as per pre-operative assessment

## 2020-04-13 NOTE — Transfer of Care (Signed)
Immediate Anesthesia Transfer of Care Note  Patient: John Andrade  Procedure(s) Performed: RIGHT SHOULDER ARTHROSCOPY, ACRMIOPLASTY, DISTAL CLAVICLE EXCISION, BICEPS TENOLYSIS (Right Shoulder) SHOULDER ARTHROSCOPY WITH BICEPSTENOTOMY (Right Shoulder)  Patient Location: PACU  Anesthesia Type:General  Level of Consciousness: drowsy, patient cooperative and responds to stimulation  Airway & Oxygen Therapy: Patient Spontanous Breathing and Patient connected to face mask oxygen  Post-op Assessment: Report given to RN and Post -op Vital signs reviewed and stable  Post vital signs: Reviewed and stable  Last Vitals:  Vitals Value Taken Time  BP    Temp    Pulse 65 04/13/20 0952  Resp 17 04/13/20 0952  SpO2 97 % 04/13/20 0952  Vitals shown include unvalidated device data.  Last Pain:  Vitals:   04/13/20 0714  TempSrc: Oral  PainSc: 3       Patients Stated Pain Goal: 5 (04/13/20 0714)  Complications: No complications documented.

## 2020-04-13 NOTE — Anesthesia Procedure Notes (Signed)
Anesthesia Regional Block: Interscalene brachial plexus block   Pre-Anesthetic Checklist: ,, timeout performed, Correct Patient, Correct Site, Correct Laterality, Correct Procedure, Correct Position, site marked, Risks and benefits discussed,  Surgical consent,  Pre-op evaluation,  At surgeon's request and post-op pain management  Laterality: Right  Prep: chloraprep       Needles:  Injection technique: Single-shot  Needle Type: Stimiplex     Needle Length: 9cm  Needle Gauge: 21     Additional Needles:   Procedures:,,,, ultrasound used (permanent image in chart),,,,  Narrative:  Start time: 04/13/2020 7:51 AM End time: 04/13/2020 7:56 AM Injection made incrementally with aspirations every 5 mL.  Performed by: Personally  Anesthesiologist: Lowella Curb, MD

## 2020-04-14 ENCOUNTER — Encounter (HOSPITAL_BASED_OUTPATIENT_CLINIC_OR_DEPARTMENT_OTHER): Payer: Self-pay | Admitting: Orthopaedic Surgery

## 2020-04-15 NOTE — Op Note (Signed)
NAME: CARRIE, SCHOONMAKER MEDICAL RECORD NO: [id number] ACCOUNT NO: [id number] DATE OF BIRTH: 1955/10/26 FACILITY: [facility] LOCATION: [facility] PHYSICIAN: Lubertha Basque. Jerl Santos, MD  Operative Report   DATE OF PROCEDURE: 04/13/2020  PREOPERATIVE DIAGNOSES:  1.  Right shoulder rotator cuff tear. 2.  Right shoulder AC degeneration.  POSTOPERATIVE DIAGNOSES: 1.  Right shoulder rotator cuff tear. 2.  Right shoulder AC degeneration.  PROCEDURES:  1.  Right shoulder arthroscopic acromioplasty. 2.  Right shoulder arthroscopic debridement. 3.  Right shoulder arthroscopic AC resection.  ANESTHESIA:  General and block.  ATTENDING SURGEON:  Lubertha Basque. Jerl Santos, MD.  ASSISTANT:  Elodia Florence, PA.  INDICATIONS:  The patient is a 65 year old man with a long history of extreme right shoulder pain.  This has persisted despite various conservative measures including injections.  By MRI scan, he has a large retracted chronic rotator cuff tear with some  biceps tearing, but no degenerative change at the glenohumeral joint.  He is offered arthroscopy.  Informed operative consent was obtained after discussion of possible complications including, reaction to anesthesia and infection.  SUMMARY OF FINDINGS AND PROCEDURE:  Under general anesthesia and a block an arthroscopy of the right shoulder was performed through total of 3 portals.  The glenohumeral joint showed no degenerative change.  The biceps tendon was partially torn and  degenerative and we elected to perform a debridement and tenolysis.  He had a massive cuff tear, which I could not reapproximate anywhere near the greater tuberosity.  By MRI scan the muscles were atrophied so we elected to perform debridement back  towards the glenoid.  He also had a prominent subacromial morphology dressed with an acromioplasty.  He had bone on bone contact at the [facility] joint and we performed the resection, there.  He was scheduled to go home same day.  DESCRIPTION OF  PROCEDURE:  The patient was taken to the operating suite where general anesthetic was applied without difficulty.  He was also given a block in the preanesthesia area.  He was positioned in beach chair position and prepped and draped in  normal sterile fashion.  After the administration of preoperative IV Kefzol, an appropriate time-out on arthroscopy of the right shoulder was performed through a total of 3 portals.  Glenohumeral joint and cuff and AC joint were as described above.   Procedure consisted of the acromioplasty done with the bur in the lateral position followed by transfer of the bur to the posterior position.  We then performed the [facility] resection through the anterior portal, removing about a centimeter of the distal  clavicle.  I debrided the rotator cuff extensively taking this back to the level of the glenoid.  He was left with anterior and posterior sleeves of tissue, which hopefully will operate his shoulder to a degree.  The biceps tendon was released and  debridement of the stump of this was done back to the glenoid as well.  The shoulder was thoroughly irrigated, followed by removal of arthroscopic equipment.  We reapproximated the portals loosely with nylon followed by Adaptic, dry gauze, and tape.   Estimated blood loss and intraoperative fluid was obtained from anesthesia records.  DISPOSITION:  The patient was extubated in the operating room and taken to recovery room in stable condition.  Plans were for him to go home same day and follow up in the office in less than a week.  I will contact him by phone tonight.   PUS D: 04/13/2020 9:44:32 am T: 04/14/2020 3:21:00 pm  JOB: [id number]

## 2020-04-29 ENCOUNTER — Other Ambulatory Visit: Payer: Self-pay | Admitting: Internal Medicine

## 2020-04-29 DIAGNOSIS — J3089 Other allergic rhinitis: Secondary | ICD-10-CM

## 2020-04-29 DIAGNOSIS — I10 Essential (primary) hypertension: Secondary | ICD-10-CM

## 2020-04-29 MED ORDER — AMLODIPINE BESYLATE 10 MG PO TABS: ORAL_TABLET | ORAL | 1 refills | Status: DC

## 2020-04-29 MED ORDER — MONTELUKAST SODIUM 10 MG PO TABS: ORAL_TABLET | ORAL | 1 refills | Status: DC

## 2020-06-30 ENCOUNTER — Other Ambulatory Visit: Payer: Self-pay | Admitting: Adult Health

## 2020-10-26 ENCOUNTER — Other Ambulatory Visit: Payer: Self-pay | Admitting: Internal Medicine

## 2020-10-26 DIAGNOSIS — J3089 Other allergic rhinitis: Secondary | ICD-10-CM

## 2020-10-26 DIAGNOSIS — I10 Essential (primary) hypertension: Secondary | ICD-10-CM

## 2020-10-26 MED ORDER — OMEPRAZOLE 40 MG PO CPDR: DELAYED_RELEASE_CAPSULE | ORAL | 3 refills | Status: DC

## 2021-03-05 DIAGNOSIS — M79641 Pain in right hand: Secondary | ICD-10-CM | POA: Diagnosis not present

## 2021-03-19 DIAGNOSIS — M79641 Pain in right hand: Secondary | ICD-10-CM | POA: Diagnosis not present

## 2021-03-19 DIAGNOSIS — M7062 Trochanteric bursitis, left hip: Secondary | ICD-10-CM | POA: Diagnosis not present

## 2021-03-31 ENCOUNTER — Encounter: Payer: Self-pay | Admitting: Internal Medicine

## 2021-04-09 DIAGNOSIS — M25552 Pain in left hip: Secondary | ICD-10-CM | POA: Diagnosis not present

## 2021-04-12 ENCOUNTER — Other Ambulatory Visit: Payer: Self-pay | Admitting: Orthopaedic Surgery

## 2021-04-12 DIAGNOSIS — M25552 Pain in left hip: Secondary | ICD-10-CM

## 2021-04-24 ENCOUNTER — Other Ambulatory Visit: Payer: Self-pay | Admitting: Internal Medicine

## 2021-04-24 DIAGNOSIS — I10 Essential (primary) hypertension: Secondary | ICD-10-CM

## 2021-04-24 MED ORDER — AMLODIPINE BESYLATE 10 MG PO TABS: ORAL_TABLET | ORAL | 1 refills | Status: DC

## 2021-04-26 DIAGNOSIS — M25561 Pain in right knee: Secondary | ICD-10-CM | POA: Diagnosis not present

## 2021-04-26 DIAGNOSIS — R109 Unspecified abdominal pain: Secondary | ICD-10-CM | POA: Diagnosis not present

## 2021-04-26 DIAGNOSIS — M170 Bilateral primary osteoarthritis of knee: Secondary | ICD-10-CM | POA: Diagnosis not present

## 2021-04-26 DIAGNOSIS — Z888 Allergy status to other drugs, medicaments and biological substances status: Secondary | ICD-10-CM | POA: Diagnosis not present

## 2021-04-26 DIAGNOSIS — Y92410 Unspecified street and highway as the place of occurrence of the external cause: Secondary | ICD-10-CM | POA: Diagnosis not present

## 2021-04-26 DIAGNOSIS — S2231XA Fracture of one rib, right side, initial encounter for closed fracture: Secondary | ICD-10-CM | POA: Diagnosis not present

## 2021-04-26 DIAGNOSIS — M25562 Pain in left knee: Secondary | ICD-10-CM | POA: Diagnosis not present

## 2021-04-26 DIAGNOSIS — S299XXA Unspecified injury of thorax, initial encounter: Secondary | ICD-10-CM | POA: Diagnosis not present

## 2021-04-26 DIAGNOSIS — S2232XA Fracture of one rib, left side, initial encounter for closed fracture: Secondary | ICD-10-CM | POA: Diagnosis not present

## 2021-04-26 DIAGNOSIS — Z886 Allergy status to analgesic agent status: Secondary | ICD-10-CM | POA: Diagnosis not present

## 2021-04-29 ENCOUNTER — Ambulatory Visit
Admission: RE | Admit: 2021-04-29 | Discharge: 2021-04-29 | Disposition: A | Discharge status: Home / Self Care | Payer: 59 | Source: Ambulatory Visit | Attending: Orthopaedic Surgery | Admitting: Orthopaedic Surgery

## 2021-04-29 ENCOUNTER — Other Ambulatory Visit: Payer: Self-pay

## 2021-04-29 DIAGNOSIS — M25552 Pain in left hip: Secondary | ICD-10-CM | POA: Diagnosis not present

## 2021-05-10 DIAGNOSIS — M545 Low back pain, unspecified: Secondary | ICD-10-CM | POA: Diagnosis not present

## 2021-05-10 DIAGNOSIS — M7062 Trochanteric bursitis, left hip: Secondary | ICD-10-CM | POA: Diagnosis not present

## 2021-06-18 ENCOUNTER — Other Ambulatory Visit: Payer: Self-pay | Admitting: Orthopaedic Surgery

## 2021-06-18 DIAGNOSIS — M545 Low back pain, unspecified: Secondary | ICD-10-CM

## 2021-06-28 ENCOUNTER — Ambulatory Visit
Admission: RE | Admit: 2021-06-28 | Discharge: 2021-06-28 | Disposition: A | Discharge status: Home / Self Care | Payer: 59 | Source: Ambulatory Visit | Attending: Orthopaedic Surgery | Admitting: Orthopaedic Surgery

## 2021-06-28 ENCOUNTER — Other Ambulatory Visit: Payer: Self-pay | Admitting: Internal Medicine

## 2021-06-28 DIAGNOSIS — M545 Low back pain, unspecified: Secondary | ICD-10-CM | POA: Diagnosis not present

## 2021-06-28 DIAGNOSIS — M48061 Spinal stenosis, lumbar region without neurogenic claudication: Secondary | ICD-10-CM | POA: Diagnosis not present

## 2021-06-28 DIAGNOSIS — I10 Essential (primary) hypertension: Secondary | ICD-10-CM

## 2021-06-28 MED ORDER — OLMESARTAN MEDOXOMIL 40 MG PO TABS: ORAL_TABLET | ORAL | 0 refills | Status: DC

## 2021-07-05 DIAGNOSIS — M5416 Radiculopathy, lumbar region: Secondary | ICD-10-CM | POA: Diagnosis not present

## 2021-07-09 DIAGNOSIS — M48062 Spinal stenosis, lumbar region with neurogenic claudication: Secondary | ICD-10-CM | POA: Diagnosis not present

## 2021-07-12 ENCOUNTER — Ambulatory Visit (INDEPENDENT_AMBULATORY_CARE_PROVIDER_SITE_OTHER): Payer: 59 | Admitting: Nurse Practitioner

## 2021-07-12 ENCOUNTER — Encounter: Payer: Self-pay | Admitting: Nurse Practitioner

## 2021-07-12 VITALS — BP 150/94 | HR 60 | Temp 97.5°F | Ht 66.0 in | Wt 243.0 lb

## 2021-07-12 DIAGNOSIS — E1169 Type 2 diabetes mellitus with other specified complication: Secondary | ICD-10-CM | POA: Diagnosis not present

## 2021-07-12 DIAGNOSIS — N182 Chronic kidney disease, stage 2 (mild): Secondary | ICD-10-CM | POA: Diagnosis not present

## 2021-07-12 DIAGNOSIS — E1122 Type 2 diabetes mellitus with diabetic chronic kidney disease: Secondary | ICD-10-CM | POA: Diagnosis not present

## 2021-07-12 DIAGNOSIS — K219 Gastro-esophageal reflux disease without esophagitis: Secondary | ICD-10-CM

## 2021-07-12 DIAGNOSIS — M5136 Other intervertebral disc degeneration, lumbar region: Secondary | ICD-10-CM

## 2021-07-12 DIAGNOSIS — M51369 Other intervertebral disc degeneration, lumbar region without mention of lumbar back pain or lower extremity pain: Secondary | ICD-10-CM

## 2021-07-12 DIAGNOSIS — E785 Hyperlipidemia, unspecified: Secondary | ICD-10-CM | POA: Diagnosis not present

## 2021-07-12 DIAGNOSIS — I10 Essential (primary) hypertension: Secondary | ICD-10-CM | POA: Diagnosis not present

## 2021-07-12 DIAGNOSIS — E538 Deficiency of other specified B group vitamins: Secondary | ICD-10-CM | POA: Diagnosis not present

## 2021-07-12 DIAGNOSIS — I700 Atherosclerosis of aorta: Secondary | ICD-10-CM | POA: Diagnosis not present

## 2021-07-12 DIAGNOSIS — Z79899 Other long term (current) drug therapy: Secondary | ICD-10-CM

## 2021-07-12 DIAGNOSIS — E559 Vitamin D deficiency, unspecified: Secondary | ICD-10-CM | POA: Diagnosis not present
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
## 2021-07-12 NOTE — Patient Instructions (Signed)
Hypertension, Adult ?Hypertension is another name for high blood pressure. High blood pressure forces your heart to work harder to pump blood. This can cause problems over time. ?There are two numbers in a blood pressure reading. There is a top number (systolic) over a bottom number (diastolic). It is best to have a blood pressure that is below 120/80. ?What are the causes? ?The cause of this condition is not known. Some other conditions can lead to high blood pressure. ?What increases the risk? ?Some lifestyle factors can make you more likely to develop high blood pressure: ?Smoking. ?Not getting enough exercise or physical activity. ?Being overweight. ?Having too much fat, sugar, calories, or salt (sodium) in your diet. ?Drinking too much alcohol. ?Other risk factors include: ?Having any of these conditions: ?Heart disease. ?Diabetes. ?High cholesterol. ?Kidney disease. ?Obstructive sleep apnea. ?Having a family history of high blood pressure and high cholesterol. ?Age. The risk increases with age. ?Stress. ?What are the signs or symptoms? ?High blood pressure may not cause symptoms. Very high blood pressure (hypertensive crisis) may cause: ?Headache. ?Fast or uneven heartbeats (palpitations). ?Shortness of breath. ?Nosebleed. ?Vomiting or feeling like you may vomit (nauseous). ?Changes in how you see. ?Very bad chest pain. ?Feeling dizzy. ?Seizures. ?How is this treated? ?This condition is treated by making healthy lifestyle changes, such as: ?Eating healthy foods. ?Exercising more. ?Drinking less alcohol. ?Your doctor may prescribe medicine if lifestyle changes do not help enough and if: ?Your top number is above 130. ?Your bottom number is above 80. ?Your personal target blood pressure may vary. ?Follow these instructions at home: ?Eating and drinking ? ?If told, follow the DASH eating plan. To follow this plan: ?Fill one half of your plate at each meal with fruits and vegetables. ?Fill one fourth of your plate  at each meal with whole grains. Whole grains include whole-wheat pasta, brown rice, and whole-grain bread. ?Eat or drink low-fat dairy products, such as skim milk or low-fat yogurt. ?Fill one fourth of your plate at each meal with low-fat (lean) proteins. Low-fat proteins include fish, chicken without skin, eggs, beans, and tofu. ?Avoid fatty meat, cured and processed meat, or chicken with skin. ?Avoid pre-made or processed food. ?Limit the amount of salt in your diet to less than 1,500 mg each day. ?Do not drink alcohol if: ?Your doctor tells you not to drink. ?You are pregnant, may be pregnant, or are planning to become pregnant. ?If you drink alcohol: ?Limit how much you have to: ?0-1 drink a day for women. ?0-2 drinks a day for men. ?Know how much alcohol is in your drink. In the [place] one drink equals one 12 oz bottle of beer (355 mL), one 5 oz glass of wine (148 mL), or one 1? oz glass of hard liquor (44 mL). ?Lifestyle ? ?Work with your doctor to stay at a healthy weight or to lose weight. Ask your doctor what the best weight is for you. ?Get at least 30 minutes of exercise that causes your heart to beat faster (aerobic exercise) most days of the week. This may include walking, swimming, or biking. ?Get at least 30 minutes of exercise that strengthens your muscles (resistance exercise) at least 3 days a week. This may include lifting weights or doing Pilates. ?Do not smoke or use any products that contain nicotine or tobacco. If you need help quitting, ask your doctor. ?Check your blood pressure at home as told by your doctor. ?Keep all follow-up visits. ?Medicines ?Take over-the-counter and prescription medicines   only as told by your doctor. Follow directions carefully. ?Do not skip doses of blood pressure medicine. The medicine does not work as well if you skip doses. Skipping doses also puts you at risk for problems. ?Ask your doctor about side effects or reactions to medicines that you should watch  for. ?Contact a doctor if: ?You think you are having a reaction to the medicine you are taking. ?You have headaches that keep coming back. ?You feel dizzy. ?You have swelling in your ankles. ?You have trouble with your vision. ?Get help right away if: ?You get a very bad headache. ?You start to feel mixed up (confused). ?You feel weak or numb. ?You feel faint. ?You have very bad pain in your: ?Chest. ?Belly (abdomen). ?You vomit more than once. ?You have trouble breathing. ?These symptoms may be an emergency. Get help right away. Call 911. ?Do not wait to see if the symptoms will go away. ?Do not drive yourself to the hospital. ?Summary ?Hypertension is another name for high blood pressure. ?High blood pressure forces your heart to work harder to pump blood. ?For most people, a normal blood pressure is less than 120/80. ?Making healthy choices can help lower blood pressure. If your blood pressure does not get lower with healthy choices, you may need to take medicine. ?This information is not intended to replace advice given to you by your health care provider. Make sure you discuss any questions you have with your health care provider. ?Document Revised: 11/26/2020 Document Reviewed: 11/26/2020 ?Elsevier Patient Education ? 2023 [employer] ? ?

## 2021-07-13 ENCOUNTER — Encounter: Payer: Self-pay | Admitting: Nurse Practitioner

## 2021-07-13 DIAGNOSIS — E875 Hyperkalemia: Secondary | ICD-10-CM

## 2021-07-13 LAB — CBC WITH DIFFERENTIAL/PLATELET
Absolute Monocytes: 376 cells/uL (ref 200–950)
Basophils Absolute: 32 cells/uL (ref 0–200)
Basophils Relative: 0.6 %
Eosinophils Absolute: 111 cells/uL (ref 15–500)
Eosinophils Relative: 2.1 %
HCT: 46.8 % (ref 38.5–50.0)
Hemoglobin: 15.7 g/dL (ref 13.2–17.1)
Lymphs Abs: 1511 cells/uL (ref 850–3900)
MCH: 29 pg (ref 27.0–33.0)
MCHC: 33.5 g/dL (ref 32.0–36.0)
MCV: 86.3 fL (ref 80.0–100.0)
MPV: 10.4 fL (ref 7.5–12.5)
Monocytes Relative: 7.1 %
Neutro Abs: 3270 cells/uL (ref 1500–7800)
Neutrophils Relative %: 61.7 %
Platelets: 302 10*3/uL (ref 140–400)
RBC: 5.42 10*6/uL (ref 4.20–5.80)
RDW: 12.8 % (ref 11.0–15.0)
Total Lymphocyte: 28.5 %
WBC: 5.3 10*3/uL (ref 3.8–10.8)

## 2021-07-13 LAB — HEMOGLOBIN A1C
Hgb A1c MFr Bld: 6.2 % of total Hgb — ABNORMAL HIGH (ref <5.7)
Mean Plasma Glucose: 131 mg/dL
eAG (mmol/L): 7.3 mmol/L

## 2021-07-13 LAB — LIPID PANEL
Cholesterol: 259 mg/dL — ABNORMAL HIGH (ref <200)
HDL: 45 mg/dL (ref >=40)
LDL Cholesterol (Calc): 189 mg/dL (calc) — ABNORMAL HIGH
Non-HDL Cholesterol (Calc): 214 mg/dL (calc) — ABNORMAL HIGH (ref <130)
Total CHOL/HDL Ratio: 5.8 (calc) — ABNORMAL HIGH (ref <5.0)
Triglycerides: 122 mg/dL (ref <150)

## 2021-07-13 LAB — COMPLETE METABOLIC PANEL WITH GFR
AG Ratio: 1.7 (calc) (ref 1.0–2.5)
ALT: 13 U/L (ref 9–46)
AST: 12 U/L (ref 10–35)
Albumin: 4.5 g/dL (ref 3.6–5.1)
Alkaline phosphatase (APISO): 94 U/L (ref 35–144)
BUN: 23 mg/dL (ref 7–25)
CO2: 27 mmol/L (ref 20–32)
Calcium: 10 mg/dL (ref 8.6–10.3)
Chloride: 107 mmol/L (ref 98–110)
Creat: 0.98 mg/dL (ref 0.70–1.35)
Globulin: 2.7 g/dL (calc) (ref 1.9–3.7)
Glucose, Bld: 137 mg/dL — ABNORMAL HIGH (ref 65–99)
Potassium: 5.8 mmol/L — ABNORMAL HIGH (ref 3.5–5.3)
Sodium: 144 mmol/L (ref 135–146)
Total Bilirubin: 0.4 mg/dL (ref 0.2–1.2)
Total Protein: 7.2 g/dL (ref 6.1–8.1)
eGFR: 86 mL/min/{1.73_m2} (ref >=60)

## 2021-07-13 LAB — VITAMIN D 25 HYDROXY (VIT D DEFICIENCY, FRACTURES): Vit D, 25-Hydroxy: 50 ng/mL (ref 30–100)

## 2021-07-13 LAB — VITAMIN B12: Vitamin B-12: 1750 pg/mL — ABNORMAL HIGH (ref 200–1100)

## 2021-07-25 ENCOUNTER — Other Ambulatory Visit: Payer: Self-pay | Admitting: Internal Medicine

## 2021-07-25 DIAGNOSIS — I10 Essential (primary) hypertension: Secondary | ICD-10-CM

## 2021-07-25 MED ORDER — OLMESARTAN MEDOXOMIL 40 MG PO TABS: ORAL_TABLET | ORAL | 1 refills | Status: DC

## 2021-07-28 DIAGNOSIS — M48062 Spinal stenosis, lumbar region with neurogenic claudication: Secondary | ICD-10-CM | POA: Diagnosis not present

## 2021-08-13 DIAGNOSIS — M5416 Radiculopathy, lumbar region: Secondary | ICD-10-CM | POA: Diagnosis not present

## 2021-08-30 ENCOUNTER — Other Ambulatory Visit: Payer: Self-pay | Admitting: Orthopedic Surgery

## 2021-08-30 DIAGNOSIS — G8929 Other chronic pain: Secondary | ICD-10-CM

## 2021-09-24 ENCOUNTER — Ambulatory Visit
Admission: RE | Admit: 2021-09-24 | Discharge: 2021-09-24 | Disposition: A | Discharge status: Home / Self Care | Payer: 59 | Source: Ambulatory Visit | Attending: Orthopedic Surgery | Admitting: Orthopedic Surgery

## 2021-09-24 DIAGNOSIS — M4326 Fusion of spine, lumbar region: Secondary | ICD-10-CM | POA: Diagnosis not present

## 2021-09-24 DIAGNOSIS — G8929 Other chronic pain: Secondary | ICD-10-CM

## 2021-09-24 DIAGNOSIS — M5416 Radiculopathy, lumbar region: Secondary | ICD-10-CM | POA: Diagnosis not present

## 2021-09-28 DIAGNOSIS — M533 Sacrococcygeal disorders, not elsewhere classified: Secondary | ICD-10-CM | POA: Diagnosis not present

## 2021-10-04 DIAGNOSIS — M533 Sacrococcygeal disorders, not elsewhere classified: Secondary | ICD-10-CM | POA: Diagnosis not present

## 2021-10-05 ENCOUNTER — Encounter: Payer: 59 | Admitting: Nurse Practitioner

## 2021-10-19 DIAGNOSIS — M533 Sacrococcygeal disorders, not elsewhere classified: Secondary | ICD-10-CM | POA: Diagnosis not present

## 2021-10-21 ENCOUNTER — Other Ambulatory Visit: Payer: Self-pay

## 2021-10-21 DIAGNOSIS — I10 Essential (primary) hypertension: Secondary | ICD-10-CM

## 2021-10-21 DIAGNOSIS — J3089 Other allergic rhinitis: Secondary | ICD-10-CM

## 2021-10-21 MED ORDER — MONTELUKAST SODIUM 10 MG PO TABS: ORAL_TABLET | ORAL | 3 refills | Status: DC

## 2021-10-21 MED ORDER — OMEPRAZOLE 40 MG PO CPDR: DELAYED_RELEASE_CAPSULE | ORAL | 3 refills | Status: DC

## 2021-10-21 MED ORDER — AMLODIPINE BESYLATE 10 MG PO TABS: ORAL_TABLET | ORAL | 1 refills | Status: DC

## 2021-11-02 ENCOUNTER — Encounter: Payer: Self-pay | Admitting: Nurse Practitioner

## 2021-11-02 DIAGNOSIS — I10 Essential (primary) hypertension: Secondary | ICD-10-CM

## 2021-11-04 MED ORDER — HYDROCHLOROTHIAZIDE 12.5 MG PO TABS: ORAL_TABLET | ORAL | 0 refills | Status: DC

## 2021-11-29 DIAGNOSIS — M533 Sacrococcygeal disorders, not elsewhere classified: Secondary | ICD-10-CM | POA: Diagnosis not present

## 2022-01-26 ENCOUNTER — Other Ambulatory Visit: Payer: Self-pay | Admitting: Internal Medicine

## 2022-01-26 DIAGNOSIS — I10 Essential (primary) hypertension: Secondary | ICD-10-CM

## 2022-01-26 MED ORDER — OLMESARTAN MEDOXOMIL 40 MG PO TABS: ORAL_TABLET | ORAL | 1 refills | Status: DC

## 2022-01-27 ENCOUNTER — Encounter: Payer: Self-pay | Admitting: Internal Medicine

## 2022-01-29 ENCOUNTER — Other Ambulatory Visit: Payer: Self-pay | Admitting: Nurse Practitioner

## 2022-01-29 DIAGNOSIS — I10 Essential (primary) hypertension: Secondary | ICD-10-CM

## 2022-02-18 IMAGING — MR MR SHOULDER*R* W/O CM
5 series · 35 of 40 positions shown · non-contrast
Comparison: None.

CLINICAL DATA: Right shoulder pain with limited range of motion for
2 years. Sports injury many years ago. No recent injury or prior
relevant surgery.

EXAM:
MRI OF THE RIGHT SHOULDER WITHOUT CONTRAST
TECHNIQUE: Multiplanar, multisequence MR imaging of the shoulder was performed.
No intravenous contrast was administered.

[Series 3: PD fat-sat · axial · 4.0mm · 0.55mm/px · z∈[+9,+87]mm · 8 of 20 slices shown]
[im 1/20]
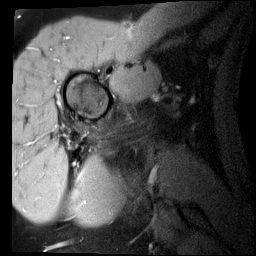
[im 3/20]
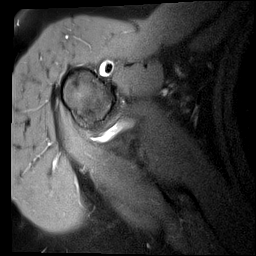
[im 6/20]
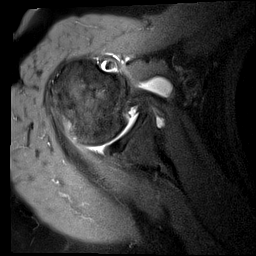
[im 9/20]
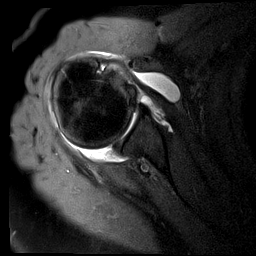
[im 11/20]
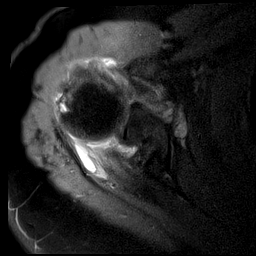
[im 14/20]
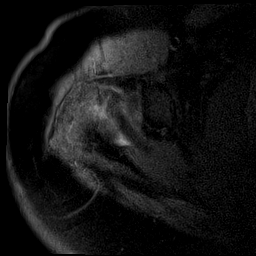
[im 17/20]
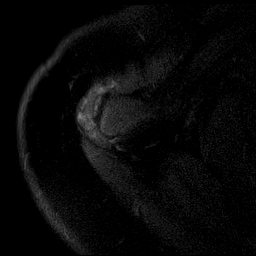
[im 20/20]
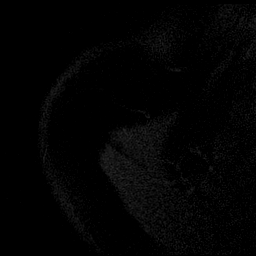

[Series 4: T2 fat-sat · oblique · 4.0mm · 0.55mm/px · 7 of 18 slices shown (1 of 2)]
[im 1/18]
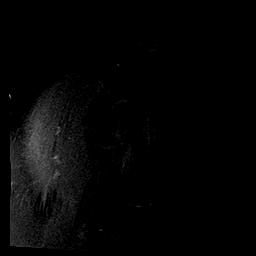
[im 3/18]
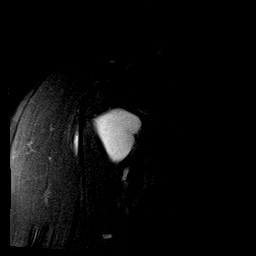
[im 6/18]
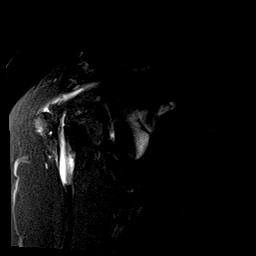
[im 9/18]
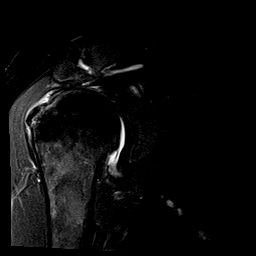
[im 12/18]
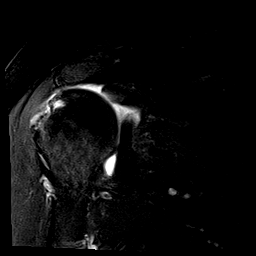
[im 15/18]
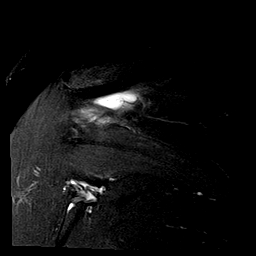
[im 18/18]
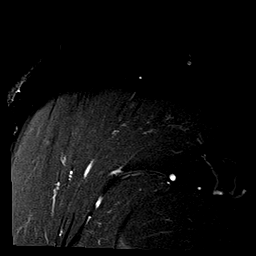

[Series 6: PD · oblique · 4.0mm · 0.27mm/px · 7 of 18 slices shown]
[im 1/18]
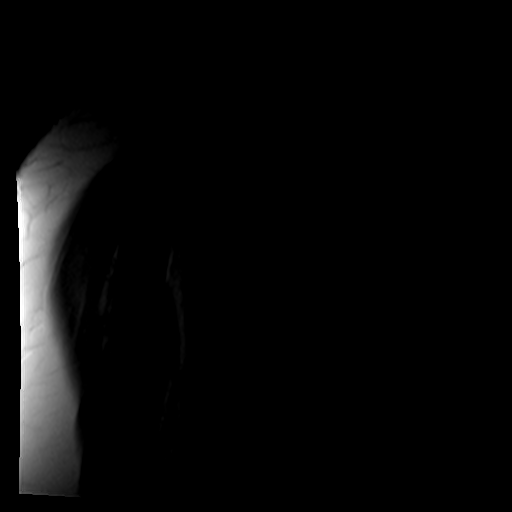
[im 3/18]
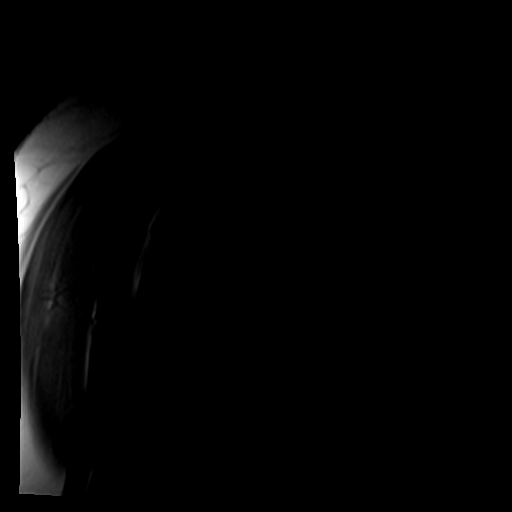
[im 6/18]
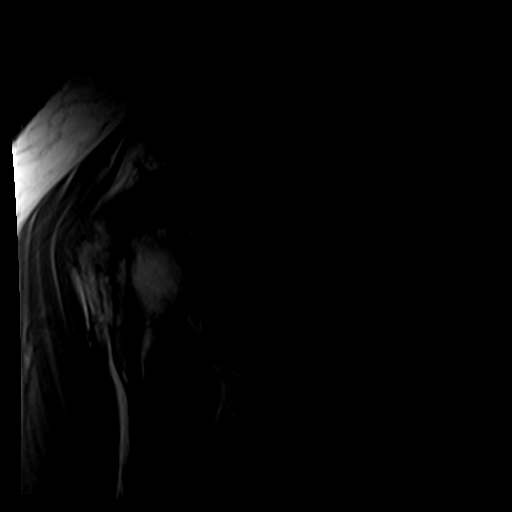
[im 9/18]
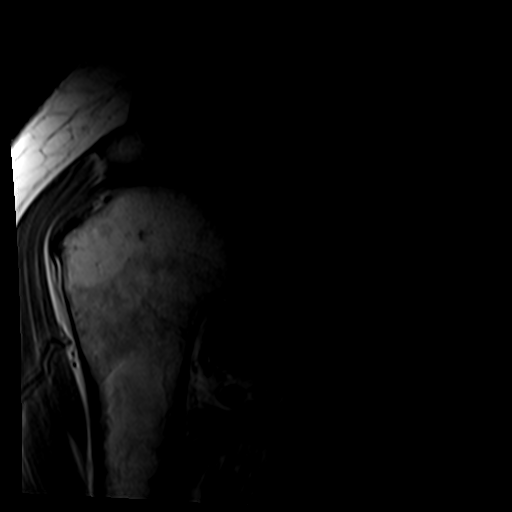
[im 12/18]
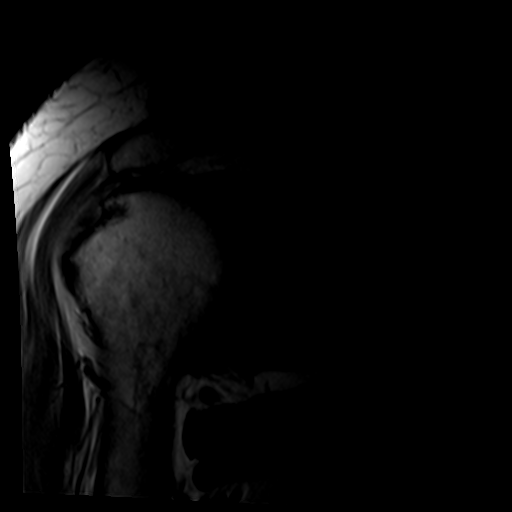
[im 15/18]
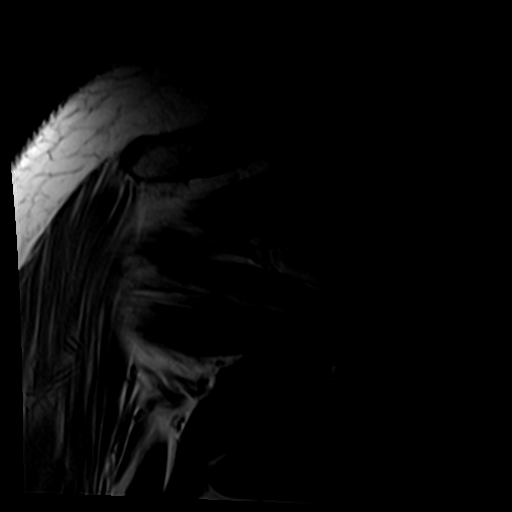
[im 18/18]
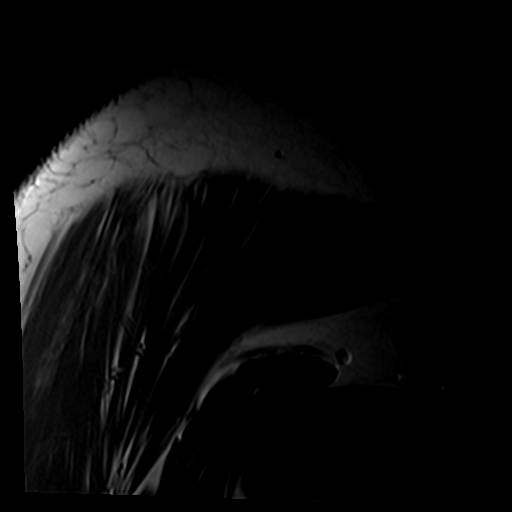

[Series 7: T2 fat-sat · oblique · 4.0mm · 0.55mm/px · 9 of 22 slices shown (2 of 2)]
[im 1/22]
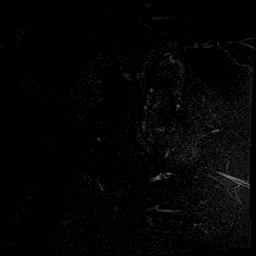
[im 3/22]
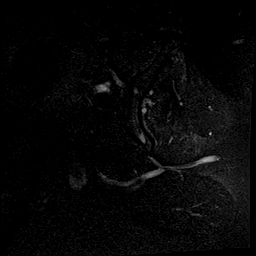
[im 6/22]
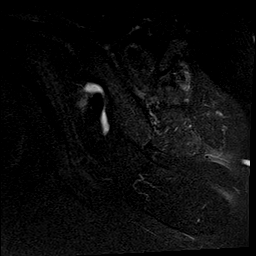
[im 8/22]
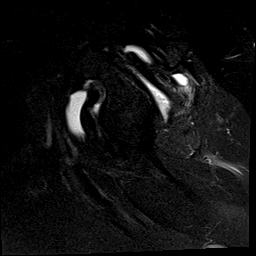
[im 11/22]
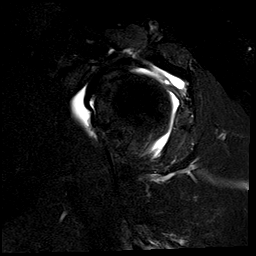
[im 14/22]
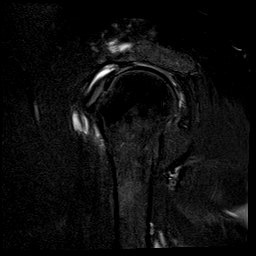
[im 16/22]
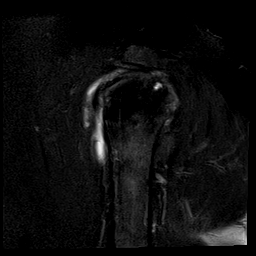
[im 19/22]
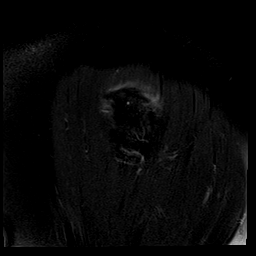
[im 22/22]
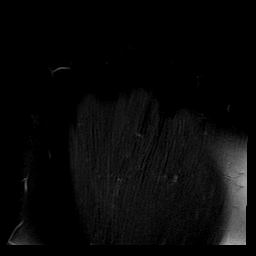

[Series 8: T1 · oblique · 4.0mm · 0.27mm/px · 4 of 22 slices shown]
[im 1/22]
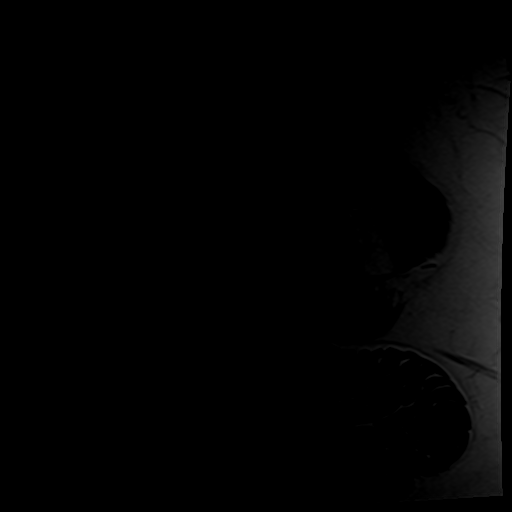
[im 3/22]
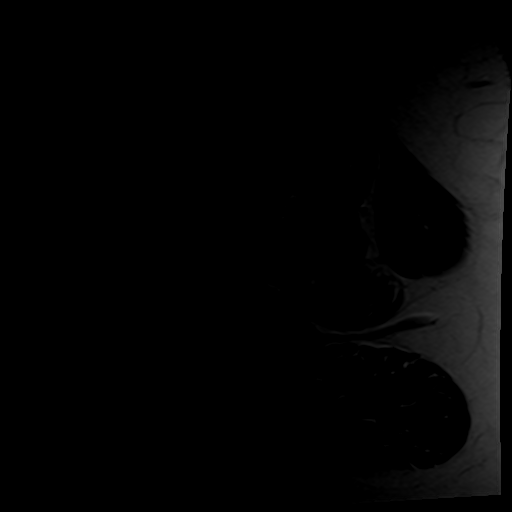
[im 6/22]
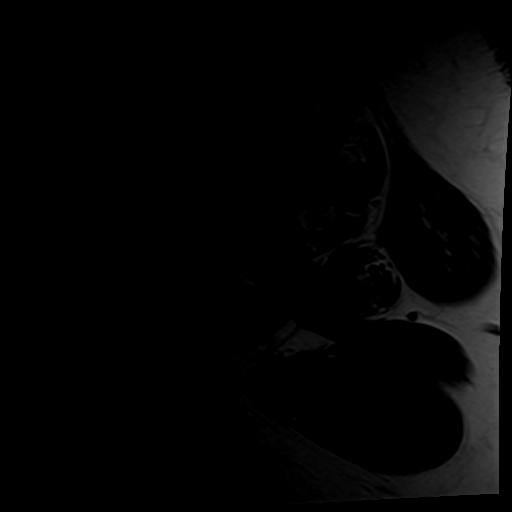
[im 8/22]
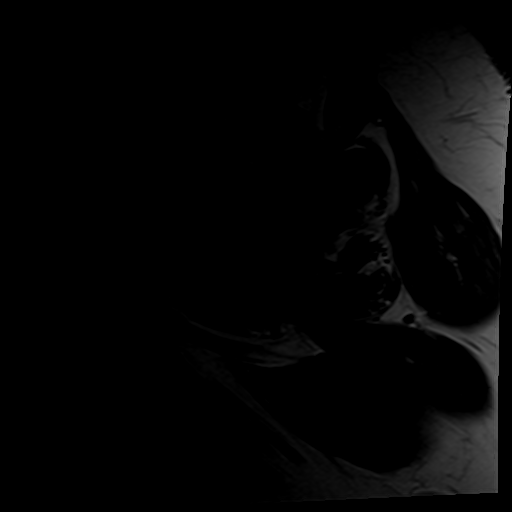

[35 of 40 positions shown; findings below may reference images not displayed]

FINDINGS: Rotator cuff: There is a large, chronic appearing full-thickness
rotator cuff tear. There is a nearly complete insertional tear of
the infraspinatus tendon with up to 3.5 cm of tendon retraction. The
supraspinatus tendon is completely torn and diffusely attenuated.
There is partial tearing of the subscapularis tendon. The teres
minor tendon is intact.

Muscles: Mild-to-moderate atrophy of the supraspinatus and
infraspinatus muscles.

Biceps long head: The intra-articular portion of the biceps tendon
is attenuated and appears partially torn. The tendon is normally
located distally in the bicipital groove.

Acromioclavicular Joint: The acromion is type 2. There are mild
acromioclavicular degenerative changes. Fluid in the
subacromial-subdeltoid and subcoracoid bursa communicates with the
shoulder joint via the full-thickness rotator cuff tear.

Glenohumeral Joint: Relatively mild glenohumeral degenerative
changes with a small shoulder joint effusion. The humeral head is
high-riding with narrowing of the subacromial space.

Labrum: Superior labral degeneration. No discrete labral tear or
paralabral cyst identified.

Bones: No acute or significant extra-articular osseous findings.

Other: No significant soft tissue findings.
IMPRESSION: 1. Large, chronic appearing full-thickness rotator cuff tear as
described. The supraspinatus and infraspinatus tendons are
completely torn and diffusely attenuated. There is mild-to-moderate
atrophy of the supraspinatus and infraspinatus muscles.
2. Partial tearing of the subscapularis tendon.
3. Partial tearing of the intra-articular portion of the biceps
tendon.
4. Superior labral degeneration.

## 2022-03-27 ENCOUNTER — Other Ambulatory Visit: Payer: Self-pay | Admitting: Internal Medicine

## 2022-03-28 ENCOUNTER — Other Ambulatory Visit: Payer: Self-pay

## 2022-03-28 DIAGNOSIS — I10 Essential (primary) hypertension: Secondary | ICD-10-CM

## 2022-03-28 MED ORDER — AMLODIPINE BESYLATE 10 MG PO TABS: ORAL_TABLET | ORAL | 1 refills | Status: DC

## 2022-03-31 ENCOUNTER — Encounter: Payer: Self-pay | Admitting: Internal Medicine

## 2022-03-31 ENCOUNTER — Ambulatory Visit: Payer: 59 | Admitting: Internal Medicine

## 2022-03-31 NOTE — Progress Notes (Addendum)
    Last Office Visit on 05/23/2-023    Diabetic not taking Meds & on 3 HTN meds     Only scheduled OV to get Meds refilled &    cancelled at time of Appt  w/o rescheduling !

## 2022-04-11 ENCOUNTER — Encounter: Payer: Self-pay | Admitting: Internal Medicine

## 2022-04-11 NOTE — Patient Instructions (Signed)
Due to recent changes in healthcare laws, you may see the results of your imaging and laboratory studies on MyChart before your provider has had a chance to review them.  We understand that in some cases there may be results that are confusing or concerning to you. Not all laboratory results come back in the same time frame and the provider may be waiting for multiple results in order to interpret others.  Please give us 48 hours in order for your provider to thoroughly review all the results before contacting the office for clarification of your results.  ++++++++++++++++++++++++++  Vit D  & Vit C 1,000 mg   are recommended to help protect  against the Covid-19 and other Corona viruses.    Also it's recommended  to take  Zinc 50 mg  to help  protect against the Covid-19   and best place to get  is also on [employer]  and don't pay more than 6-8 cents /pill !   +++++++++++++++++++++++++++++++++++++++ Recommend Adult Low Dose Aspirin or  coated  Aspirin 81 mg daily  To reduce risk of Colon Cancer 40 %,  Skin Cancer 26 % ,  Melanoma 46%  and  Pancreatic cancer 60% +++++++++++++++++++++++++++++++++++++++++ Vitamin D goal  is between 70-100.  Please make sure that you are taking your Vitamin D as directed.  It is very important as a natural anti-inflammatory  helping hair, skin, and nails, as well as reducing stroke and heart attack risk.  It helps your bones and helps with mood. It also decreases numerous cancer risks so please take it as directed.  Low Vit D is associated with a 200-300% higher risk for CANCER  and 200-300% higher risk for HEART   ATTACK  &  STROKE.   ...................................... It is also associated with higher death rate at younger ages,  autoimmune diseases like Rheumatoid arthritis, Lupus, Multiple Sclerosis.    Also many other serious conditions, like depression, Alzheimer's Dementia, infertility, muscle aches, fatigue, fibromyalgia - just to name  a few. +++++++++++++++++++++++++++++++++++++++++ Recommend the book "The END of DIETING" by Dr Joel Fuhrman  & the book "The END of DIABETES " by Dr Joel Fuhrman At [employer] - get book & Audio CD's    Being diabetic has a  300% increased risk for heart attack, stroke, cancer, and alzheimer- type vascular dementia. It is very important that you work harder with diet by avoiding all foods that are white. Avoid white rice (brown & wild rice is OK), white potatoes (sweetpotatoes in moderation is OK), White bread or wheat bread or anything made out of white flour like bagels, donuts, rolls, buns, biscuits, cakes, pastries, cookies, pizza crust, and pasta (made from white flour & egg whites) - vegetarian pasta or spinach or wheat pasta is OK. Multigrain breads like Arnold's or [employer], or multigrain sandwich thins or flatbreads.  Diet, exercise and weight loss can reverse and cure diabetes in the early stages.  Diet, exercise and weight loss is very important in the control and prevention of complications of diabetes which affects every system in your body, ie. Brain - dementia/stroke, eyes - glaucoma/blindness, heart - heart attack/heart failure, kidneys - dialysis, stomach - gastric paralysis, intestines - malabsorption, nerves - severe painful neuritis, circulation - gangrene & loss of a leg(s), and finally cancer and Alzheimers.    I recommend avoid fried & greasy foods,  sweets/candy, white rice (brown or wild rice or Quinoa is OK), white potatoes (sweet potatoes are OK) - anything   made from white flour - bagels, doughnuts, rolls, buns, biscuits,white and wheat breads, pizza crust and traditional pasta made of white flour & egg white(vegetarian pasta or spinach or wheat pasta is OK).  Multi-grain bread is OK - like multi-grain flat bread or sandwich thins. Avoid alcohol in excess. Exercise is also important.    Eat all the vegetables you want - avoid meat, especially red meat and dairy - especially  cheese.  Cheese is the most concentrated form of trans-fats which is the worst thing to clog up our arteries. Veggie cheese is OK which can be found in the fresh produce section at [employer] or [employer] or [employer]  +++++++++++++++++++++++++++++++++++++++ DASH Eating Plan  DASH stands for "Dietary Approaches to Stop Hypertension."   The DASH eating plan is a healthy eating plan that has been shown to reduce high blood pressure (hypertension). Additional health benefits may include reducing the risk of type 2 diabetes mellitus, heart disease, and stroke. The DASH eating plan may also help with weight loss. WHAT DO I NEED TO KNOW ABOUT THE DASH EATING PLAN? For the DASH eating plan, you will follow these general guidelines: Choose foods with a percent daily value for sodium of less than 5% (as listed on the food label). Use salt-free seasonings or herbs instead of table salt or sea salt. Check with your health care provider or pharmacist before using salt substitutes. Eat lower-sodium products, often labeled as "lower sodium" or "no salt added." Eat fresh foods. Eat more vegetables, fruits, and low-fat dairy products. Choose whole grains. Look for the word "whole" as the first word in the ingredient list. Choose fish  Limit sweets, desserts, sugars, and sugary drinks. Choose heart-healthy fats. Eat veggie cheese  Eat more home-cooked food and less restaurant, buffet, and fast food. Limit fried foods. Buehrle foods using methods other than frying. Limit canned vegetables. If you do use them, rinse them well to decrease the sodium. When eating at a restaurant, ask that your food be prepared with less salt, or no salt if possible.                      WHAT FOODS CAN I EAT? Read Dr Joel Fuhrman's books on The End of Dieting & The End of Diabetes  Grains Whole grain or whole wheat bread. Brown rice. Whole grain or whole wheat pasta. Quinoa, bulgur, and whole grain cereals. Low-sodium  cereals. Corn or whole wheat flour tortillas. Whole grain cornbread. Whole grain crackers. Low-sodium crackers.  Vegetables Fresh or frozen vegetables (raw, steamed, roasted, or grilled). Low-sodium or reduced-sodium tomato and vegetable juices. Low-sodium or reduced-sodium tomato sauce and paste. Low-sodium or reduced-sodium canned vegetables.   Fruits All fresh, canned (in natural juice), or frozen fruits.  Protein Products  All fish and seafood.  Dried beans, peas, or lentils. Unsalted nuts and seeds. Unsalted canned beans.  Dairy Low-fat dairy products, such as skim or 1% milk, 2% or reduced-fat cheeses, low-fat ricotta or cottage cheese, or plain low-fat yogurt. Low-sodium or reduced-sodium cheeses.  Fats and Oils Tub margarines without trans fats. Light or reduced-fat mayonnaise and salad dressings (reduced sodium). Avocado. Safflower, olive, or canola oils. Natural peanut or almond butter.  Other Unsalted popcorn and pretzels. The items listed above may not be a complete list of recommended foods or beverages. Contact your dietitian for more options.  +++++++++++++++  WHAT FOODS ARE NOT RECOMMENDED? Grains/ White flour or wheat flour White bread. White pasta. White rice. Refined   cornbread. Bagels and croissants. Crackers that contain trans fat.  Vegetables  Creamed or fried vegetables. Vegetables in a . Regular canned vegetables. Regular canned tomato sauce and paste. Regular tomato and vegetable juices.  Fruits Dried fruits. Canned fruit in light or heavy syrup. Fruit juice.  Meat and Other Protein Products Meat in general - RED meat & White meat.  Fatty cuts of meat. Ribs, chicken wings, all processed meats as bacon, sausage, bologna, salami, fatback, hot dogs, bratwurst and packaged luncheon meats.  Dairy Whole or 2% milk, cream, half-and-half, and cream cheese. Whole-fat or sweetened yogurt. Full-fat cheeses or blue cheese. Non-dairy creamers and whipped toppings.  Processed cheese, cheese spreads, or cheese curds.  Condiments Onion and garlic salt, seasoned salt, table salt, and sea salt. Canned and packaged gravies. Worcestershire sauce. Tartar sauce. Barbecue sauce. Teriyaki sauce. Soy sauce, including reduced sodium. Steak sauce. Fish sauce. Oyster sauce. Cocktail sauce. Horseradish. Ketchup and mustard. Meat flavorings and tenderizers. Bouillon cubes. Hot sauce. Tabasco sauce. Marinades. Taco seasonings. Relishes.  Fats and Oils Butter, stick margarine, lard, shortening and bacon fat. Coconut, palm kernel, or palm oils. Regular salad dressings.  Pickles and olives. Salted popcorn and pretzels.  The items listed above may not be a complete list of foods and beverages to avoid.  

## 2022-04-11 NOTE — Progress Notes (Addendum)
Future Appointments  Date Time Provider Department  04/12/2022 11:30 AM Unk Pinto, MD [facility]    History of Present Illness:       This very nice 67 y.o. MWM presents for  belated  follow up with HTN, HLD, T2_DM, GERD  and Vitamin D Deficiency.  Office f/u has been very sporadic.  Patient's GERD is controlled on his meds.         Patient is treated for HTN  at least  since 2014   & BP has been controlled at home. Today's  . Patient has had no complaints of any cardiac type chest pain, palpitations, dyspnea / orthopnea / PND, dizziness, claudication, or dependent edema.      Hyperlipidemia is  not controlled with diet &  hx/o statin intolerance. Apparently has been tried on a variety of anti lipid meds .   Last Lipids were not at goal :  Lab Results  Component Value Date   CHOL 259 (H) 07/12/2021   HDL 45 07/12/2021   LDLCALC 189 (H) 07/12/2021   TRIG 122 07/12/2021   CHOLHDL 5.8 (H) 07/12/2021     Also, the patient has history of  PreDiabetes (A1c 5.8% /2014)  and then T2_NIDDM (A1c 7.1% /2015)  rx'd Metformin and has had no symptoms of reactive hypoglycemia, diabetic polys, paresthesias or visual blurring.  Patient has self d/c'd  his Metformin . Last A1c was not at goal :  Lab Results  Component Value Date   HGBA1C 6.2 (H) 07/12/2021                                                         Further, the patient also has history of Vitamin D Deficiency  ("22" /2017) and supplements vitamin D .   Last vitamin D was low :  Lab Results  Component Value Date   VD25OH 1 07/12/2021     Current Outpatient Medications on File Prior to Visit  Medication Sig   amLODipine  10 MG tablet Take  1 tablet  Daily     CELEBREX 200 MG capsule Take 1 capsule  Daily         VITAMIN D3) 50 MCG 2000 u Take 6,000 Units daily.   VITAMIN B-12  1000 mcg  SL Place under the tongue.   Cyclobenzaprine  Take by mouth as needed.   hydrochlorothiazide 12.5 MG tablet TAKE 1 TABLET   DAILY    HYDROCODONE-APAP  Take  as needed.   L-Lysine 500 MG TABS Take  daily.    CLARITIN 10 MG tablet Take  daily.   meclizine (25 MG tablet 1/2-1 pill up to 3 times daily as needed    montelukast  10 MG tablet Take  1 tablet  Daily for A   olmesartan 40 MG tablet Take  1 tablet  Daily    omeprazole  40 MG capsule Take  1 capsule  2 x /day      Allergies  Allergen Reactions   Statins Other (See Comments)    Flu-like symptoms   Tylenol [Acetaminophen]     Liver enzymes elevated so patient has stopped taking anything with acetaminophen in it   Phentermine Other (See Comments)    constipation     PMHx:   Past Medical History:  Diagnosis  Date   COVID-19 03/18/2019   Jan 6th 2021   DDD lumbar    DJD (degenerative joint disease)    Dysrhythmia    Trigeminy   GERD (gastroesophageal reflux disease)    takes Omeprazole daily   History of kidney stones    Hyperlipidemia    Hypertension    takes Lisinopril and Amlodipine daily   Multiple allergies    takes Claritin and Singulair daily   PONV (postoperative nausea and vomiting)    with his longer surgeries   Pre-diabetes    Not on any medications   Vertigo 10/15/2019   peripheral vertigo last episode was in 2019     Immunization History  Administered Date(s) Administered   DTaP 01/13/2010   Influenza Inj Mdck Quad Pf 12/16/2016   Influenza Whole 02/21/2012   Influenza-Unspecified 12/19/2016   PPD Test 08/14/2013   Zoster Recombinat (Shingrix) 10/16/2017      Past Surgical History:  Procedure Laterality Date   ANKLE FRACTURE SURGERY  1995   right   BACK SURGERY  2002   lumb lam-fusion   BACK SURGERY  03-2014   Dr Carloyn Manner in [place]     2007   CARPAL TUNNEL RELEASE Right 09/24/2019   Procedure: RIGHT CARPAL TUNNEL RELEASE;  Surgeon: Melrose Nakayama, MD;  Location: [facility];  Service: Orthopedics;  Laterality: Right;   CARPAL TUNNEL RELEASE Left 10/22/2019   Procedure: LEFT CARPAL TUNNEL  RELEASE;  Surgeon: Melrose Nakayama, MD;  Location: [facility];  Service: Orthopedics;  Laterality: Left;   CHONDROPLASTY Left 10/28/2014   Procedure: CHONDROPLASTY;  Surgeon: Melrose Nakayama, MD;  Location: [facility];  Service: Orthopedics;  Laterality: Left;   COLONOSCOPY     ELBOW LIGAMENT RECONSTRUCTION Right 2015   [facility]   KNEE ARTHROSCOPY Left 04/24/2012   Procedure: ARTHROSCOPY KNEE, PARTIAL MEDIAL MENISECTOMY AND CHONDROPLASTY;  Surgeon: Hessie Dibble, MD;  Location: [facility];  Service: Orthopedics;  Laterality: Left;   KNEE ARTHROSCOPY WITH MEDIAL MENISECTOMY Left 10/28/2014   Procedure: LEFT ARTHROSCOPY KNEE, PARTIAL MEDIAL MENISECTOMY, CHONDROPLASTY;  Surgeon: Melrose Nakayama, MD;  Location: [facility];  Service: Orthopedics;  Laterality: Left;   SHOULDER ARTHROSCOPY WITH BICEPSTENOTOMY Right 04/13/2020   Procedure: SHOULDER ARTHROSCOPY WITH BICEPSTENOTOMY;  Surgeon: Melrose Nakayama, MD;  Location: [facility];  Service: Orthopedics;  Laterality: Right;   TOTAL HIP ARTHROPLASTY Right 09/22/2015   Procedure: TOTAL HIP ARTHROPLASTY ANTERIOR APPROACH;  Surgeon: Melrose Nakayama, MD;  Location: [place];  Service: Orthopedics;  Laterality: Right;   TOTAL HIP ARTHROPLASTY Left 01/05/2016   Procedure: LEFT TOTAL HIP ARTHROPLASTY ANTERIOR APPROACH;  Surgeon: Melrose Nakayama, MD;  Location: [facility];  Service: Orthopedics;  Laterality: Left;   TRIGGER FINGER RELEASE Right 09/24/2019   Procedure: RIGHT RELEASE TRIGGER FINGER/A-1 PULLEY;  Surgeon: Melrose Nakayama, MD;  Location: [facility];  Service: Orthopedics;  Laterality: Right;   WISDOM TOOTH EXTRACTION       FHx:    Reviewed / unchanged   SHx:    Reviewed / unchanged    Systems Review:  Constitutional: Denies fever, chills, wt changes, headaches, insomnia, fatigue, night sweats, change in appetite. Eyes: Denies redness, blurred vision, diplopia, discharge, itchy, watery eyes.  ENT: Denies  discharge, congestion, post nasal drip, epistaxis, sore throat, earache, hearing loss, dental pain, tinnitus, vertigo, sinus pain, snoring.  CV: Denies chest pain, palpitations, irregular heartbeat, syncope, dyspnea, diaphoresis, orthopnea, PND, claudication or edema. Respiratory: denies cough, dyspnea, DOE,  pleurisy, hoarseness, laryngitis, wheezing.  Gastrointestinal: Denies dysphagia, odynophagia, heartburn, reflux, water brash, abdominal pain or cramps, nausea, vomiting, bloating, diarrhea, constipation, hematemesis, melena, hematochezia  or hemorrhoids. Genitourinary: Denies dysuria, frequency, urgency, nocturia, hesitancy, discharge, hematuria or flank pain. Musculoskeletal: Denies arthralgias, myalgias, stiffness, jt. swelling, pain, limping or strain/sprain.  Skin: Denies pruritus, rash, hives, warts, acne, eczema or change in skin lesion(s). Neuro: No weakness, tremor, incoordination, spasms, paresthesia or pain. Psychiatric: Denies confusion, memory loss or sensory loss. Endo: Denies change in weight, skin or hair change.  Heme/Lymph: No excessive bleeding, bruising or enlarged lymph nodes.   Physical Exam  There were no vitals taken for this visit.  Appears  well nourished, well groomed  and in no distress.  Eyes: PERRLA, EOMs, conjunctiva no swelling or erythema. Sinuses: No frontal/maxillary tenderness ENT/Mouth: EAC's clear, TM's nl w/o erythema, bulging. Nares clear w/o erythema, swelling, exudates. Oropharynx clear without erythema or exudates. Oral hygiene is good. Tongue normal, non obstructing. Hearing intact.  Neck: Supple. Thyroid not palpable. Car 2+/2+ without bruits, nodes or JVD. Chest: Respirations nl with BS clear & equal w/o rales, rhonchi, wheezing or stridor.  Cor: Heart sounds normal w/ regular rate and rhythm without sig. murmurs, gallops, clicks or rubs. Peripheral pulses normal and equal  without edema.  Abdomen: Soft & bowel sounds normal. Non-tender w/o  guarding, rebound, hernias, masses or organomegaly.  Lymphatics: Unremarkable.  Musculoskeletal: Full ROM all peripheral extremities, joint stability, 5/5 strength and normal gait.  Skin: Warm, dry without exposed rashes, lesions or ecchymosis apparent.  Neuro: Cranial nerves intact, reflexes equal bilaterally. Sensory-motor testing grossly intact. Tendon reflexes grossly intact.  Pysch: Alert & oriented x 3.  Insight and judgement nl & appropriate. No ideations.   Assessment and Plan:  - Continue medication, monitor blood pressure at home.  - Continue DASH diet.  Reminder to go to the ER if any CP,  SOB, nausea, dizziness, severe HA, changes vision/speech.  - Continue diet/meds, exercise,& lifestyle modifications.  - Continue monitor periodic cholesterol/liver & renal functions    - Continue diet, exercise  - Lifestyle modifications.  - Monitor appropriate labs. - Continue supplementation.        Discussed  regular exercise, BP monitoring, weight control to achieve/maintain BMI less than 25 and discussed med and SE's. Recommended labs to assess /monitor clinical status .  I discussed the assessment and treatment plan with the patient. The patient was provided an opportunity to ask questions and all were answered. The patient agreed with the plan and demonstrated an understanding of the instructions.  I provided over 30 minutes of exam, counseling, chart review and  complex critical decision making.        The patient was advised to call back or seek an in-person evaluation if the symptoms worsen or if the condition fails to improve as anticipated.   Kirtland Bouchard, MD

## 2022-04-12 ENCOUNTER — Encounter: Payer: Self-pay | Admitting: Internal Medicine

## 2022-04-12 ENCOUNTER — Ambulatory Visit (INDEPENDENT_AMBULATORY_CARE_PROVIDER_SITE_OTHER): Payer: 59 | Admitting: Internal Medicine

## 2022-04-12 VITALS — BP 160/82 | HR 69 | Temp 97.9°F | Resp 17 | Ht 66.0 in | Wt 248.6 lb

## 2022-04-12 DIAGNOSIS — H6123 Impacted cerumen, bilateral: Secondary | ICD-10-CM

## 2022-04-12 DIAGNOSIS — I10 Essential (primary) hypertension: Secondary | ICD-10-CM | POA: Diagnosis not present

## 2022-04-12 DIAGNOSIS — E559 Vitamin D deficiency, unspecified: Secondary | ICD-10-CM

## 2022-04-12 DIAGNOSIS — Z6839 Body mass index (BMI) 39.0-39.9, adult: Secondary | ICD-10-CM | POA: Diagnosis not present

## 2022-04-12 DIAGNOSIS — Z79899 Other long term (current) drug therapy: Secondary | ICD-10-CM

## 2022-04-12 DIAGNOSIS — E1169 Type 2 diabetes mellitus with other specified complication: Secondary | ICD-10-CM

## 2022-04-12 DIAGNOSIS — N182 Chronic kidney disease, stage 2 (mild): Secondary | ICD-10-CM

## 2022-04-12 DIAGNOSIS — E785 Hyperlipidemia, unspecified: Secondary | ICD-10-CM

## 2022-04-12 DIAGNOSIS — E1122 Type 2 diabetes mellitus with diabetic chronic kidney disease: Secondary | ICD-10-CM | POA: Diagnosis not present

## 2022-04-12 DIAGNOSIS — E538 Deficiency of other specified B group vitamins: Secondary | ICD-10-CM | POA: Diagnosis not present

## 2022-04-12 MED ORDER — OLMESARTAN MEDOXOMIL 40 MG PO TABS: ORAL_TABLET | ORAL | 1 refills | Status: DC

## 2022-04-12 MED ORDER — HYDROCHLOROTHIAZIDE 25 MG PO TABS: ORAL_TABLET | ORAL | 1 refills | Status: DC

## 2022-04-12 MED ORDER — AMLODIPINE BESYLATE 10 MG PO TABS: ORAL_TABLET | ORAL | 1 refills | Status: DC

## 2022-04-12 MED ORDER — VITAMIN D3 125 MCG (5000 UT) PO CAPS: ORAL_CAPSULE | ORAL | Status: AC

## 2022-04-12 MED ORDER — MINOXIDIL 10 MG PO TABS: ORAL_TABLET | ORAL | 1 refills | Status: DC

## 2022-04-13 ENCOUNTER — Encounter: Payer: Self-pay | Admitting: Internal Medicine

## 2022-04-13 ENCOUNTER — Other Ambulatory Visit: Payer: Self-pay | Admitting: Internal Medicine

## 2022-04-13 DIAGNOSIS — N182 Chronic kidney disease, stage 2 (mild): Secondary | ICD-10-CM

## 2022-04-13 LAB — COMPLETE METABOLIC PANEL WITH GFR
AG Ratio: 1.5 (calc) (ref 1.0–2.5)
ALT: 15 U/L (ref 9–46)
AST: 13 U/L (ref 10–35)
Albumin: 4.7 g/dL (ref 3.6–5.1)
Alkaline phosphatase (APISO): 89 U/L (ref 35–144)
BUN: 23 mg/dL (ref 7–25)
CO2: 27 mmol/L (ref 20–32)
Calcium: 10.3 mg/dL (ref 8.6–10.3)
Chloride: 102 mmol/L (ref 98–110)
Creat: 1.04 mg/dL (ref 0.70–1.35)
Globulin: 3.2 g/dL (calc) (ref 1.9–3.7)
Glucose, Bld: 134 mg/dL — ABNORMAL HIGH (ref 65–99)
Potassium: 4.8 mmol/L (ref 3.5–5.3)
Sodium: 140 mmol/L (ref 135–146)
Total Bilirubin: 0.4 mg/dL (ref 0.2–1.2)
Total Protein: 7.9 g/dL (ref 6.1–8.1)
eGFR: 80 mL/min/{1.73_m2} (ref >=60)

## 2022-04-13 LAB — CBC WITH DIFFERENTIAL/PLATELET
Absolute Monocytes: 428 cells/uL (ref 200–950)
Basophils Absolute: 27 cells/uL (ref 0–200)
Basophils Relative: 0.4 %
Eosinophils Absolute: 109 cells/uL (ref 15–500)
Eosinophils Relative: 1.6 %
HCT: 46.7 % (ref 38.5–50.0)
Hemoglobin: 16 g/dL (ref 13.2–17.1)
Lymphs Abs: 1918 cells/uL (ref 850–3900)
MCH: 28.6 pg (ref 27.0–33.0)
MCHC: 34.3 g/dL (ref 32.0–36.0)
MCV: 83.5 fL (ref 80.0–100.0)
MPV: 10.5 fL (ref 7.5–12.5)
Monocytes Relative: 6.3 %
Neutro Abs: 4318 cells/uL (ref 1500–7800)
Neutrophils Relative %: 63.5 %
Platelets: 299 10*3/uL (ref 140–400)
RBC: 5.59 10*6/uL (ref 4.20–5.80)
RDW: 12.9 % (ref 11.0–15.0)
Total Lymphocyte: 28.2 %
WBC: 6.8 10*3/uL (ref 3.8–10.8)

## 2022-04-13 LAB — INSULIN, RANDOM: Insulin: 27.6 u[IU]/mL — ABNORMAL HIGH

## 2022-04-13 LAB — LIPID PANEL
Cholesterol: 292 mg/dL — ABNORMAL HIGH (ref <200)
HDL: 51 mg/dL (ref >=40)
LDL Cholesterol (Calc): 206 mg/dL (calc) — ABNORMAL HIGH
Non-HDL Cholesterol (Calc): 241 mg/dL (calc) — ABNORMAL HIGH (ref <130)
Total CHOL/HDL Ratio: 5.7 (calc) — ABNORMAL HIGH (ref <5.0)
Triglycerides: 177 mg/dL — ABNORMAL HIGH (ref <150)

## 2022-04-13 LAB — VITAMIN D 25 HYDROXY (VIT D DEFICIENCY, FRACTURES): Vit D, 25-Hydroxy: 46 ng/mL (ref 30–100)

## 2022-04-13 LAB — MAGNESIUM: Magnesium: 2 mg/dL (ref 1.5–2.5)

## 2022-04-13 LAB — HEMOGLOBIN A1C
Hgb A1c MFr Bld: 6.9 % of total Hgb — ABNORMAL HIGH (ref <5.7)
Mean Plasma Glucose: 151 mg/dL
eAG (mmol/L): 8.4 mmol/L

## 2022-04-13 LAB — VITAMIN B12: Vitamin B-12: 379 pg/mL (ref 200–1100)

## 2022-04-13 LAB — TSH: TSH: 1.87 mIU/L (ref 0.40–4.50)

## 2022-04-13 MED ORDER — METFORMIN HCL ER 500 MG PO TB24: ORAL_TABLET | ORAL | 3 refills | Status: DC

## 2022-04-13 NOTE — Progress Notes (Signed)
<><><><><><><><><><><><><><><><><><><><><><><><><><><><><><><><><> <><><><><><><><><><><><><><><><><><><><><><><><><><><><><><><><><> - Test results slightly outside the reference range are not unusual. If there is anything important, I will review this with you,  otherwise it is considered normal test values.  If you have further questions,  please do not hesitate to contact me at the office or via My Chart.  <><><><><><><><><><><><><><><><><><><><><><><><><><><><><><><><><> <><><><><><><><><><><><><><><><><><><><><><><><><><><><><><><><><>  - Total Chol =   292   is very high risk for Heart Attack /Stroke /Vascular Dementia     ( Ideal or Goal is less than 180 ! )  & - Bad /Dangerous LDL Chol =  206    - - >> Sitting on a time Bomb !     ( Ideal or Goal is less than 70 ! )    - The cause is Bad Diet !    Since you can't take Statin  Cholesterol meds,   - I' m referring you to the [facility]  - But need to go ahead & start meds until get on a better diet to try &                                                          reverse some of the Damage already done   - Read or listen to   Dr Alden Benjamin 's book    " How Not to Die ! "    - Recommend a stricter plant based low cholesterol diet   - Cholesterol only comes from animal sources                                                                 - ie. meat, dairy, egg yolks  - Eat all the vegetables you want.   - Avoid Meat, Avoid Meat , Avoid Meat  ! ! !                                                  -especially red meat - Beef AND Pork  - Avoid cheese & dairy - milk & ice cream.   - Cheese is the most concentrated form of trans-fats which                                                    is the worst thing to clog up our arteries.  <><><><><><><><><><><><><><><><><><><><><><><><><><><><><><><><><> <><><><><><><><><><><><><><><><><><><><><><><><><><><><><><><><><>  -  A1c  = 6.9% - is now in the Diabetic range Again &                                   at the point that you need to start  Metformin,  So new Rx sent to your drugstore  <><><><><><><><><><><><><><><><><><><><><><><><><><><><><><><><><> <><><><><><><><><><><><><><><><><><><><><><><><><><><><><><><><><>  -   Vitamin D = 46 - very low   -  Vitamin D goal is between 70-100.   -   Please INCREASE your Vitamin D 5,000 unit capsules                                                                   up to 2 capsules = 10,000 units every day   - It is very important as a natural anti-inflammatory and helping the                                      immune system protect against viral infections, like the Covid-19    helping hair, skin, and nails, as well as reducing stroke and heart attack risk.   - It helps your bones and helps with mood.  - It also decreases numerous cancer risks so please  take it as directed.   - Low Vit D is associated with a 200-300% higher risk for CANCER   and 200-300% higher risk for HEART   ATTACK  &  STROKE.    - It is also associated with higher death rate at younger ages,   autoimmune diseases like Rheumatoid arthritis, Multiple Sclerosis.     - Also,   other serious conditions, like Depression, Alzheimer's Dementia,                                                         muscle aches, fatigue, fibromyalgia  <><><><><><><><><><><><><><><><><><><><><><><><><><><><><><><><><> <><><><><><><><><><><><><><><><><><><><><><><><><><><><><><><><><>  -  Vitamin B12 =    379 is   Very Low  (Ideal or Goal Vit B12 is between 450 - 1,100)   Low Vit B12 may be associated with Anemia , Fatigue,   Peripheral Neuropathy, Dementia, "Brain Fog", & Depression  - Recommend take a sub-lingual form of Vitamin B12 tablet   1,000 to 5,000 mcg tab that you dissolve under your tongue /Daily   - Can  get Baron Sane - best price at [employer] or on [employer] <><><><><><><><><><><><><><><><><><><><><><><><><><><><><><><><><> <><><><><><><><><><><><><><><><><><><><><><><><><><><><><><><><><>

## 2022-04-14 ENCOUNTER — Other Ambulatory Visit: Payer: Self-pay | Admitting: Internal Medicine

## 2022-04-14 DIAGNOSIS — I10 Essential (primary) hypertension: Secondary | ICD-10-CM

## 2022-04-14 MED ORDER — MINOXIDIL 10 MG PO TABS: ORAL_TABLET | ORAL | 1 refills | Status: DC

## 2022-04-14 MED ORDER — HYDROCHLOROTHIAZIDE 25 MG PO TABS: ORAL_TABLET | ORAL | 1 refills | Status: DC

## 2022-04-28 ENCOUNTER — Other Ambulatory Visit: Payer: Self-pay

## 2022-04-28 DIAGNOSIS — I10 Essential (primary) hypertension: Secondary | ICD-10-CM

## 2022-04-28 MED ORDER — HYDROCHLOROTHIAZIDE 25 MG PO TABS: ORAL_TABLET | ORAL | 1 refills | Status: DC

## 2022-07-20 ENCOUNTER — Ambulatory Visit: Payer: Medicare Other | Admitting: Nurse Practitioner

## 2022-07-26 ENCOUNTER — Other Ambulatory Visit: Payer: Self-pay | Admitting: Internal Medicine

## 2022-07-26 DIAGNOSIS — I10 Essential (primary) hypertension: Secondary | ICD-10-CM

## 2022-10-21 ENCOUNTER — Other Ambulatory Visit: Payer: Self-pay | Admitting: Nurse Practitioner

## 2022-10-21 DIAGNOSIS — I10 Essential (primary) hypertension: Secondary | ICD-10-CM

## 2022-10-26 ENCOUNTER — Ambulatory Visit: Payer: Medicare Other | Admitting: Nurse Practitioner

## 2022-10-26 ENCOUNTER — Ambulatory Visit: Payer: Medicare Other | Admitting: Internal Medicine

## 2022-11-05 ENCOUNTER — Other Ambulatory Visit: Payer: Self-pay | Admitting: Internal Medicine

## 2022-11-05 DIAGNOSIS — I10 Essential (primary) hypertension: Secondary | ICD-10-CM

## 2022-11-05 DIAGNOSIS — J3089 Other allergic rhinitis: Secondary | ICD-10-CM

## 2022-11-05 MED ORDER — MONTELUKAST SODIUM 10 MG PO TABS: ORAL_TABLET | ORAL | 0 refills | Status: DC

## 2022-11-05 MED ORDER — AMLODIPINE BESYLATE 10 MG PO TABS: ORAL_TABLET | ORAL | 0 refills | Status: DC

## 2022-11-05 MED ORDER — OMEPRAZOLE 40 MG PO CPDR: DELAYED_RELEASE_CAPSULE | ORAL | 0 refills | Status: DC

## 2023-01-30 ENCOUNTER — Telehealth: Payer: Self-pay

## 2023-01-30 ENCOUNTER — Ambulatory Visit (INDEPENDENT_AMBULATORY_CARE_PROVIDER_SITE_OTHER): Payer: Medicare Other | Admitting: Nurse Practitioner

## 2023-01-30 ENCOUNTER — Encounter: Payer: Self-pay | Admitting: Nurse Practitioner

## 2023-01-30 VITALS — BP 132/82 | HR 66 | Temp 98.0°F | Ht 64.75 in | Wt 256.2 lb

## 2023-01-30 DIAGNOSIS — Z0000 Encounter for general adult medical examination without abnormal findings: Secondary | ICD-10-CM

## 2023-01-30 DIAGNOSIS — Z136 Encounter for screening for cardiovascular disorders: Secondary | ICD-10-CM | POA: Diagnosis not present

## 2023-01-30 DIAGNOSIS — N182 Chronic kidney disease, stage 2 (mild): Secondary | ICD-10-CM | POA: Diagnosis not present

## 2023-01-30 DIAGNOSIS — Z125 Encounter for screening for malignant neoplasm of prostate: Secondary | ICD-10-CM

## 2023-01-30 DIAGNOSIS — E538 Deficiency of other specified B group vitamins: Secondary | ICD-10-CM

## 2023-01-30 DIAGNOSIS — Z1211 Encounter for screening for malignant neoplasm of colon: Secondary | ICD-10-CM

## 2023-01-30 DIAGNOSIS — E1122 Type 2 diabetes mellitus with diabetic chronic kidney disease: Secondary | ICD-10-CM

## 2023-01-30 DIAGNOSIS — R6889 Other general symptoms and signs: Secondary | ICD-10-CM | POA: Diagnosis not present

## 2023-01-30 DIAGNOSIS — M51361 Other intervertebral disc degeneration, lumbar region with lower extremity pain only: Secondary | ICD-10-CM

## 2023-01-30 DIAGNOSIS — E559 Vitamin D deficiency, unspecified: Secondary | ICD-10-CM | POA: Diagnosis not present

## 2023-01-30 DIAGNOSIS — E1169 Type 2 diabetes mellitus with other specified complication: Secondary | ICD-10-CM

## 2023-01-30 DIAGNOSIS — Z23 Encounter for immunization: Secondary | ICD-10-CM | POA: Diagnosis not present

## 2023-01-30 DIAGNOSIS — K219 Gastro-esophageal reflux disease without esophagitis: Secondary | ICD-10-CM

## 2023-01-30 DIAGNOSIS — Z79899 Other long term (current) drug therapy: Secondary | ICD-10-CM | POA: Diagnosis not present

## 2023-01-30 DIAGNOSIS — Z0001 Encounter for general adult medical examination with abnormal findings: Secondary | ICD-10-CM | POA: Diagnosis not present

## 2023-01-30 DIAGNOSIS — Z1389 Encounter for screening for other disorder: Secondary | ICD-10-CM

## 2023-01-30 DIAGNOSIS — I10 Essential (primary) hypertension: Secondary | ICD-10-CM

## 2023-01-30 MED ORDER — SEMAGLUTIDE(0.25 OR 0.5MG/DOS) 2 MG/3ML ~~LOC~~ SOPN: 0.2500 mg | PEN_INJECTOR | SUBCUTANEOUS | 2 refills | Status: DC

## 2023-01-30 NOTE — Telephone Encounter (Signed)
 Ozempic PA approved.

## 2023-01-30 NOTE — Progress Notes (Signed)
Welcome to [employer] & Annual CPE  Medicare Wellness with Annual Physical  Due annually  Health maintenance reviewed Healthily lifestyle goals set  Primary hypertension Continue Amlodipine. Continue Olmesartan. Discussed DASH (Dietary Approaches to Stop Hypertension) DASH diet is lower in sodium than a typical American diet. Cut back on foods that are high in saturated fat, cholesterol, and trans fats. Eat more whole-grain foods, fish, poultry, and nuts Remain active and exercise as tolerated daily.  Monitor BP at home-Call if greater than 130/80.  Check CMP/CBC  - CBC with Differential/Platelet  Aortic atherosclerosis (HCC) Discussed lifestyle modifications. Recommended diet heavy in fruits and veggies, omega 3's. Decrease consumption of animal meats, cheeses, and dairy products. Remain active and exercise as tolerated. Continue to monitor. Check lipids/TSH  - CBC with Differential/Platelet  Gastroesophageal reflux disease, unspecified whether esophagitis present Controlled on Omeprazole. No suspected reflux complications (Barret/stricture). Lifestyle modification:  wt loss, avoid meals 2-3h before bedtime. Consider eliminating food triggers:  chocolate, caffeine, EtOH, acid/spicy food.  Hyperlipidemia associated with type 2 diabetes mellitus (HCC) Discussed lifestyle modifications. Recommended diet heavy in fruits and veggies, omega 3's. Decrease consumption of animal meats, cheeses, and dairy products. Remain active and exercise as tolerated. Continue to monitor. Check lipids/TSH   - Lipid panel  Type 2 diabetes mellitus with stage 2 chronic kidney disease, without long-term current use of insulin [facility]) Education: Reviewed 'ABCs' of diabetes management  Discussed goals to be met and/or maintained include A1C (<7) Blood pressure (<130/80) Cholesterol (LDL <70) Continue Eye Exam yearly  Continue Dental Exam Q6 mo Discussed dietary recommendations Discussed  Physical Activity recommendations Foot exam UTD Check A1C  - COMPLETE METABOLIC PANEL WITH GFR - Hemoglobin A1c  CKD stage 2 due to type 2 diabetes mellitus (HCC) Stay well hydrated. Avoid high salt foods. Avoid NSAIDS. Keep BP and BG well controlled.   Take medications as prescribed. Remain active and exercise as tolerated daily. Maintain weight.  Continue to monitor. Check CMP/GFR/Microablumin   - COMPLETE METABOLIC PANEL WITH GFR  DDD (degenerative disc disease), lumbar Continue to follow with pain management Epidural injections scheduled for 08/2021 Continue to monitor.  Morbidly obese (HCC) Discussed appropriate BMI Diet modification. Physical activity. Encouraged/praised to build confidence.  - COMPLETE METABOLIC PANEL WITH GFR - Lipid panel  Vitamin D deficiency Continue supplement for goal of 60-100 Monitor Vitamin D levels  - VITAMIN D 25 Hydroxy (Vit-D Deficiency, Fractures)  B12 deficiency Continue supplement. Continue to monitor.  - Vitamin B12  Medication management All medications reviewed in detail. All questions and concerns addressed.  - CBC with Differential/Platelet - COMPLETE METABOLIC PANEL WITH GFR - Lipid panel - Hemoglobin A1c - VITAMIN D 25 Hydroxy (Vit-D Deficiency, Fractures) - Vitamin B12  Need for flu vaccine Administered - patient tolerated well without complications   Notify office for further evaluation and treatment, questions or concerns if any reported s/s fail to improve.   The patient was advised to call back or seek an in-person evaluation if any symptoms worsen or if the condition fails to improve as anticipated.   Further disposition pending results of labs. Discussed med's effects and SE's.    I discussed the assessment and treatment plan with the patient. The patient was provided an opportunity to ask questions and all were answered. The patient agreed with the plan and demonstrated an understanding of the  instructions.  Discussed med's effects and SE's. Screening labs and tests as requested with regular follow-up as recommended.  I provided 45 minutes  of face-to-face time during this encounter including counseling, chart review, and critical decision making was preformed.  Today's Plan of Care is based on a patient-centered health care approach known as shared decision making - the decisions, tests and treatments allow for patient preferences and values to be balanced with clinical evidence.     Future Appointments  Date Time Provider Department Center  08/07/2023 10:30 AM Lucky Cowboy, MD [facility] None  01/30/2024  9:00 AM Adela Glimpse, NP [facility] None   Plan:   During the course of the visit the patient was educated and counseled about appropriate screening and preventive services including:   Pneumococcal vaccine  Influenza vaccine Prevnar 13 Td vaccine Screening electrocardiogram Colorectal cancer screening Diabetes screening Glaucoma screening Nutrition counseling    Subjective:  John Andrade is a 67 y.o. male who presents for Medicare Annual Wellness Visit and Complete Physical. He has Morbidly obese (HCC); Hyperlipidemia associated with type 2 diabetes mellitus (HCC); Hypertension; Type 2 diabetes mellitus (HCC); Vitamin D deficiency; Medication management; History of nephrolithiasis; Chronic pain; DDD (degenerative disc disease), lumbar; Aortic atherosclerosis (HCC); GERD (gastroesophageal reflux disease); Statin myopathy; CKD stage 2 due to type 2 diabetes mellitus (HCC); Environmental allergies; B12 deficiency; and Benign paroxysmal positional vertigo of left ear on their problem list.  Patient follows up "sporadically."  Overall he reports feeling well today.  He has no new or additional concerns in clinic today.    BMI is Body mass index is 42.96 kg/m., he has not been working on diet and exercise. Wt Readings from Last 3 Encounters:  01/30/23 256 lb 3.2 oz  (116.2 kg)  04/12/22 248 lb 9.6 oz (112.8 kg)  07/12/21 243 lb (110.2 kg)   His blood pressure has been controlled at home, today their BP is BP: 132/82  He does workout. He denies chest pain, shortness of breath, dizziness.   He is not on cholesterol medication and denies myalgias. His cholesterol is at goal. The cholesterol last visit was:   Lab Results  Component Value Date   CHOL 246 (H) 01/30/2023   HDL 46 01/30/2023   LDLCALC 174 (H) 01/30/2023   TRIG 125 01/30/2023   CHOLHDL 5.3 (H) 01/30/2023   He has been working on diet and exercise for prediabetes, and denies polydipsia and polyuria. Last A1C in the office was:  Lab Results  Component Value Date   HGBA1C 7.7 (H) 01/30/2023   Last GFR Lab Results  Component Value Date   EGFR 74 01/30/2023   Patient is on Vitamin D supplement.   Lab Results  Component Value Date   VD25OH 59 01/30/2023      Medication Review:  Current Outpatient Medications (Endocrine & Metabolic):    Semaglutide,0.25 or 0.5MG /DOS, 2 MG/3ML SOPN, Inject 0.25 mg into the skin once a week.   metFORMIN (GLUCOPHAGE-XR) 500 MG 24 hr tablet, Take 2 tablets 2 x /day with Meals for Diabetes. (Patient not taking: Reported on 01/30/2023)  Current Outpatient Medications (Cardiovascular):    hydrochlorothiazide (HYDRODIURIL) 25 MG tablet, TAKE 1 TABLET BY MOUTH EVERY MORNING FOR FOR BLOOD PRESSURE AND FLUID RETENTION/ANKLE SWELLING   amLODipine (NORVASC) 10 MG tablet, Take  1 tablet  every Morning   for BP                                                              /  TAKE                                  BY                          MOUTH   ezetimibe (ZETIA) 10 MG tablet, Take 1 tablet (10 mg total) by mouth daily.   minoxidil (LONITEN) 10 MG tablet, Take  1 tablet  at Night  for  BP   olmesartan (BENICAR) 40 MG tablet, TAKE 1 TABLET BY MOUTH DAILY FOR BLOOD PRESSURE  Current Outpatient Medications (Respiratory):     loratadine (CLARITIN) 10 MG tablet, Take 10 mg by mouth daily.   montelukast (SINGULAIR) 10 MG tablet, Take  1 tablet  Daily for Allergies                                /                                                                   TAKE                                         BY                                                 MOUTH    Current Outpatient Medications (Other):    Cholecalciferol (VITAMIN D3) 125 MCG (5000 UT) capsule, Take 2 capsules ( 10,000 units)  Daily   L-Lysine 500 MG TABS, Take 500 mg by mouth daily.    meclizine (ANTIVERT) 25 MG tablet, 1/2-1 pill up to 3 times daily as needed for motion sickness/dizziness (Patient not taking: Reported on 01/30/2023)   omeprazole (PRILOSEC) 40 MG capsule, Take  1 capsule  2 x /day  to Prevent Heartburn & Indigestion                                                  /                                                                   TAKE                                         BY  MOUTH  Allergies: Allergies  Allergen Reactions   Statins Other (See Comments)    Flu-like symptoms   Tylenol [Acetaminophen]     Liver enzymes elevated so patient has stopped taking anything with acetaminophen in it   Phentermine Other (See Comments)    constipation    Current Problems (verified) has Morbidly obese (HCC); Hyperlipidemia associated with type 2 diabetes mellitus (HCC); Hypertension; Type 2 diabetes mellitus (HCC); Vitamin D deficiency; Medication management; History of nephrolithiasis; Chronic pain; DDD (degenerative disc disease), lumbar; Aortic atherosclerosis (HCC); GERD (gastroesophageal reflux disease); Statin myopathy; CKD stage 2 due to type 2 diabetes mellitus (HCC); Environmental allergies; B12 deficiency; and Benign paroxysmal positional vertigo of left ear on their problem list.  Screening Tests Immunization History  Administered Date(s) Administered   DTaP 01/13/2010   Influenza  Inj Mdck Quad Pf 12/16/2016   Influenza Whole 02/21/2012   Influenza, High Dose Seasonal PF 01/30/2023   Influenza-Unspecified 12/19/2016   PPD Test 08/14/2013   Zoster Recombinant(Shingrix) 10/16/2017   Health Maintenance  Topic Date Due   COVID-19 Vaccine (1) Never done   Pneumonia Vaccine 47+ Years old (1 of 2 - PCV) Never done   FOOT EXAM  Never done   OPHTHALMOLOGY EXAM  Never done   HIV Screening  Never done   Colonoscopy  Never done   Zoster Vaccines- Shingrix (2 of 2) 12/11/2017   DTaP/Tdap/Td (2 - Tdap) 01/14/2020   HEMOGLOBIN A1C  07/31/2023   Diabetic kidney evaluation - eGFR measurement  01/30/2024   Diabetic kidney evaluation - Urine ACR  01/30/2024   Medicare Annual Wellness (AWV)  01/30/2024   INFLUENZA VACCINE  Completed   Hepatitis C Screening  Completed   HPV VACCINES  Aged Out   Names of Other Physician/Practitioners you currently use: 1. [facility] here for primary care   Patient Care Team: Lucky Cowboy, MD as PCP - General (Internal Medicine) Emeline General, MD as Referring Physician (Neurosurgery) Lyn Records, MD (Inactive) as Consulting Physician (Cardiology) Sharrell Ku, MD as Consulting Physician (Gastroenterology) Marcine Matar, MD as Consulting Physician (Urology)  Surgical: He  has a past surgical history that includes Ankle fracture surgery (1995); Colonoscopy; Knee arthroscopy (Left, 04/24/2012); Elbow ligament reconstruction (Right, 2015); Knee arthroscopy with medial menisectomy (Left, 10/28/2014); Chondroplasty (Left, 10/28/2014); Back surgery (2002); Back surgery (03-2014); Cardiac catheterization; Total hip arthroplasty (Right, 09/22/2015); Total hip arthroplasty (Left, 01/05/2016); Wisdom tooth extraction; Carpal tunnel release (Right, 09/24/2019); Trigger finger release (Right, 09/24/2019); Carpal tunnel release (Left, 10/22/2019); and Shoulder arthroscopy with bicepstenotomy (Right,  04/13/2020). Family His family history includes Cancer in his father and maternal grandmother; Dementia in his mother; Diabetes in his sister; Heart disease in his father; Hyperlipidemia in his father; Hypertension in his father and mother. Social history  He reports that he quit smoking about 48 years ago. His smoking use included cigarettes. He has never used smokeless tobacco. He reports that he does not drink alcohol and does not use drugs.  MEDICARE WELLNESS OBJECTIVES: Physical activity: Current Exercise Habits: The patient does not participate in regular exercise at present Cardiac risk factors:   Depression/mood screen:      02/12/2023    9:29 PM  Depression screen PHQ 2/9  Decreased Interest 0  Down, Depressed, Hopeless 0  PHQ - 2 Score 0    ADLs:     02/12/2023    9:29 PM  In your present state of health, do you have any difficulty performing the following activities:  Hearing? 0  Vision? 0  Difficulty concentrating or making decisions? 0  Walking or climbing stairs? 0  Dressing or bathing? 0  Doing errands, shopping? 0     Cognitive Testing  Alert? Yes  Normal Appearance?Yes  Oriented to person? Yes  Place? Yes   Time? Yes  Recall of three objects?  Yes  Can perform simple calculations? Yes  Displays appropriate judgment?Yes  Can read the correct time from a watch face?Yes  EOL planning: Does Patient Have a Medical Advance Directive?: No   Objective:   Today's Vitals   01/30/23 0953  BP: 132/82  Pulse: 66  Temp: 98 F (36.7 C)  SpO2: 98%  Weight: 256 lb 3.2 oz (116.2 kg)  Height: 5' 4.75" (1.645 m)   Body mass index is 42.96 kg/m.  General appearance: alert, no distress, WD/WN, male HEENT: normocephalic, sclerae anicteric, TMs pearly, nares patent, no discharge or erythema, pharynx normal Oral cavity: MMM, no lesions Neck: supple, no lymphadenopathy, no thyromegaly, no masses Heart: RRR, normal S1, S2, no murmurs Lungs: CTA bilaterally, no  wheezes, rhonchi, or rales Abdomen: +bs, soft, non tender, non distended, no masses, no hepatomegaly, no splenomegaly Musculoskeletal: nontender, no swelling, no obvious deformity Extremities: no edema, no cyanosis, no clubbing Pulses: 2+ symmetric, upper and lower extremities, normal cap refill Neurological: alert, oriented x 3, CN2-12 intact, strength normal upper extremities and lower extremities, sensation normal throughout, DTRs 2+ throughout, no cerebellar signs, gait normal Psychiatric: normal affect, behavior normal, pleasant   EKG: NSR  Medicare Attestation I have personally reviewed: The patient's medical and social history Their use of alcohol, tobacco or illicit drugs Their current medications and supplements The patient's functional ability including ADLs,fall risks, home safety risks, cognitive, and hearing and visual impairment Diet and physical activities Evidence for depression or mood disorders  The patient's weight, height, BMI, and visual acuity have been recorded in the chart.  I have made referrals, counseling, and provided education to the patient based on review of the above and I have provided the patient with a written personalized care plan for preventive services.     Adela Glimpse, NP   02/12/2023

## 2023-01-30 NOTE — Telephone Encounter (Signed)
Prior Authorization for Ozempic submitted.  

## 2023-01-31 LAB — HEMOGLOBIN A1C
Hgb A1c MFr Bld: 7.7 %{Hb} — ABNORMAL HIGH (ref <5.7)
Mean Plasma Glucose: 174 mg/dL
eAG (mmol/L): 9.7 mmol/L

## 2023-01-31 LAB — URINALYSIS, ROUTINE W REFLEX MICROSCOPIC
Bacteria, UA: NONE SEEN /[HPF]
Bilirubin Urine: NEGATIVE
Glucose, UA: NEGATIVE
Hgb urine dipstick: NEGATIVE
Hyaline Cast: NONE SEEN /[LPF]
Ketones, ur: NEGATIVE
Leukocytes,Ua: NEGATIVE
Nitrite: NEGATIVE
RBC / HPF: NONE SEEN /[HPF] (ref 0–2)
Specific Gravity, Urine: 1.015 (ref 1.001–1.035)
Squamous Epithelial / HPF: NONE SEEN /[HPF] (ref <=5)
WBC, UA: NONE SEEN /[HPF] (ref 0–5)
pH: 6 (ref 5.0–8.0)

## 2023-01-31 LAB — CBC WITH DIFFERENTIAL/PLATELET
Absolute Lymphocytes: 1522 {cells}/uL (ref 850–3900)
Absolute Monocytes: 399 {cells}/uL (ref 200–950)
Basophils Absolute: 29 {cells}/uL (ref 0–200)
Basophils Relative: 0.5 %
Eosinophils Absolute: 171 {cells}/uL (ref 15–500)
Eosinophils Relative: 3 %
HCT: 44 % (ref 38.5–50.0)
Hemoglobin: 14.3 g/dL (ref 13.2–17.1)
MCH: 28.3 pg (ref 27.0–33.0)
MCHC: 32.5 g/dL (ref 32.0–36.0)
MCV: 87 fL (ref 80.0–100.0)
MPV: 10.3 fL (ref 7.5–12.5)
Monocytes Relative: 7 %
Neutro Abs: 3580 {cells}/uL (ref 1500–7800)
Neutrophils Relative %: 62.8 %
Platelets: 289 10*3/uL (ref 140–400)
RBC: 5.06 10*6/uL (ref 4.20–5.80)
RDW: 12.8 % (ref 11.0–15.0)
Total Lymphocyte: 26.7 %
WBC: 5.7 10*3/uL (ref 3.8–10.8)

## 2023-01-31 LAB — COMPLETE METABOLIC PANEL WITH GFR
AG Ratio: 1.6 (calc) (ref 1.0–2.5)
ALT: 17 U/L (ref 9–46)
AST: 15 U/L (ref 10–35)
Albumin: 4.5 g/dL (ref 3.6–5.1)
Alkaline phosphatase (APISO): 67 U/L (ref 35–144)
BUN: 20 mg/dL (ref 7–25)
CO2: 28 mmol/L (ref 20–32)
Calcium: 9.9 mg/dL (ref 8.6–10.3)
Chloride: 105 mmol/L (ref 98–110)
Creat: 1.11 mg/dL (ref 0.70–1.35)
Globulin: 2.9 g/dL (ref 1.9–3.7)
Glucose, Bld: 166 mg/dL — ABNORMAL HIGH (ref 65–99)
Potassium: 4.5 mmol/L (ref 3.5–5.3)
Sodium: 143 mmol/L (ref 135–146)
Total Bilirubin: 0.5 mg/dL (ref 0.2–1.2)
Total Protein: 7.4 g/dL (ref 6.1–8.1)
eGFR: 74 mL/min/{1.73_m2} (ref >=60)

## 2023-01-31 LAB — MICROSCOPIC MESSAGE

## 2023-01-31 LAB — PSA: PSA: 1.01 ng/mL (ref <=4.00)

## 2023-01-31 LAB — LIPID PANEL
Cholesterol: 246 mg/dL — ABNORMAL HIGH (ref <200)
HDL: 46 mg/dL (ref >=40)
LDL Cholesterol (Calc): 174 mg/dL — ABNORMAL HIGH
Non-HDL Cholesterol (Calc): 200 mg/dL — ABNORMAL HIGH (ref <130)
Total CHOL/HDL Ratio: 5.3 (calc) — ABNORMAL HIGH (ref <5.0)
Triglycerides: 125 mg/dL (ref <150)

## 2023-01-31 LAB — VITAMIN D 25 HYDROXY (VIT D DEFICIENCY, FRACTURES): Vit D, 25-Hydroxy: 59 ng/mL (ref 30–100)

## 2023-01-31 LAB — MAGNESIUM: Magnesium: 1.9 mg/dL (ref 1.5–2.5)

## 2023-01-31 LAB — TSH: TSH: 1.43 m[IU]/L (ref 0.40–4.50)

## 2023-01-31 LAB — MICROALBUMIN / CREATININE URINE RATIO
Creatinine, Urine: 68 mg/dL (ref 20–320)
Microalb Creat Ratio: 194 mg/g{creat} — ABNORMAL HIGH (ref <30)
Microalb, Ur: 13.2 mg/dL

## 2023-01-31 LAB — INSULIN, RANDOM: Insulin: 20.6 u[IU]/mL — ABNORMAL HIGH

## 2023-01-31 LAB — VITAMIN B12: Vitamin B-12: 347 pg/mL (ref 200–1100)

## 2023-02-01 ENCOUNTER — Other Ambulatory Visit: Payer: Self-pay

## 2023-02-01 ENCOUNTER — Encounter: Payer: Self-pay | Admitting: Nurse Practitioner

## 2023-02-01 ENCOUNTER — Other Ambulatory Visit: Payer: Self-pay | Admitting: Nurse Practitioner

## 2023-02-01 DIAGNOSIS — I10 Essential (primary) hypertension: Secondary | ICD-10-CM

## 2023-02-01 DIAGNOSIS — E785 Hyperlipidemia, unspecified: Secondary | ICD-10-CM

## 2023-02-01 MED ORDER — MINOXIDIL 10 MG PO TABS: ORAL_TABLET | ORAL | 1 refills | Status: DC

## 2023-02-01 MED ORDER — EZETIMIBE 10 MG PO TABS: 10.0000 mg | ORAL_TABLET | Freq: Every day | ORAL | 11 refills | Status: AC

## 2023-02-03 ENCOUNTER — Other Ambulatory Visit: Payer: Self-pay

## 2023-02-03 DIAGNOSIS — J3089 Other allergic rhinitis: Secondary | ICD-10-CM

## 2023-02-03 DIAGNOSIS — I10 Essential (primary) hypertension: Secondary | ICD-10-CM

## 2023-02-03 MED ORDER — AMLODIPINE BESYLATE 10 MG PO TABS: ORAL_TABLET | ORAL | 0 refills | Status: DC

## 2023-02-03 MED ORDER — OMEPRAZOLE 40 MG PO CPDR: DELAYED_RELEASE_CAPSULE | ORAL | 0 refills | Status: DC

## 2023-02-03 MED ORDER — MONTELUKAST SODIUM 10 MG PO TABS: ORAL_TABLET | ORAL | 0 refills | Status: DC

## 2023-02-03 MED ORDER — OLMESARTAN MEDOXOMIL 40 MG PO TABS: ORAL_TABLET | ORAL | 1 refills | Status: DC

## 2023-02-12 NOTE — Patient Instructions (Signed)

## 2023-02-25 DIAGNOSIS — Z1211 Encounter for screening for malignant neoplasm of colon: Secondary | ICD-10-CM | POA: Diagnosis not present

## 2023-02-25 LAB — COLOGUARD: Cologuard: NEGATIVE

## 2023-03-08 LAB — COLOGUARD: COLOGUARD: NEGATIVE

## 2023-03-14 ENCOUNTER — Other Ambulatory Visit: Payer: Self-pay | Admitting: Internal Medicine

## 2023-03-14 ENCOUNTER — Encounter: Payer: Self-pay | Admitting: Nurse Practitioner

## 2023-03-14 ENCOUNTER — Other Ambulatory Visit: Payer: Self-pay

## 2023-03-14 DIAGNOSIS — I10 Essential (primary) hypertension: Secondary | ICD-10-CM

## 2023-03-14 MED ORDER — HYDROCHLOROTHIAZIDE 25 MG PO TABS: ORAL_TABLET | ORAL | 1 refills | Status: DC

## 2023-04-02 ENCOUNTER — Emergency Department (HOSPITAL_BASED_OUTPATIENT_CLINIC_OR_DEPARTMENT_OTHER): Payer: Medicare Other

## 2023-04-02 ENCOUNTER — Emergency Department (HOSPITAL_BASED_OUTPATIENT_CLINIC_OR_DEPARTMENT_OTHER)
Admission: EM | Admit: 2023-04-02 | Discharge: 2023-04-03 | Disposition: A | Discharge status: Home / Self Care | Payer: Medicare Other | Attending: Emergency Medicine | Admitting: Emergency Medicine

## 2023-04-02 ENCOUNTER — Encounter (HOSPITAL_BASED_OUTPATIENT_CLINIC_OR_DEPARTMENT_OTHER): Payer: Self-pay | Admitting: Emergency Medicine

## 2023-04-02 ENCOUNTER — Other Ambulatory Visit: Payer: Self-pay

## 2023-04-02 DIAGNOSIS — K5750 Diverticulosis of both small and large intestine without perforation or abscess without bleeding: Secondary | ICD-10-CM | POA: Diagnosis not present

## 2023-04-02 DIAGNOSIS — E1122 Type 2 diabetes mellitus with diabetic chronic kidney disease: Secondary | ICD-10-CM | POA: Diagnosis not present

## 2023-04-02 DIAGNOSIS — R109 Unspecified abdominal pain: Secondary | ICD-10-CM | POA: Diagnosis present

## 2023-04-02 DIAGNOSIS — K760 Fatty (change of) liver, not elsewhere classified: Secondary | ICD-10-CM | POA: Diagnosis not present

## 2023-04-02 DIAGNOSIS — Z79899 Other long term (current) drug therapy: Secondary | ICD-10-CM | POA: Insufficient documentation

## 2023-04-02 DIAGNOSIS — Z7984 Long term (current) use of oral hypoglycemic drugs: Secondary | ICD-10-CM | POA: Insufficient documentation

## 2023-04-02 DIAGNOSIS — R1011 Right upper quadrant pain: Secondary | ICD-10-CM | POA: Insufficient documentation

## 2023-04-02 DIAGNOSIS — D72829 Elevated white blood cell count, unspecified: Secondary | ICD-10-CM | POA: Insufficient documentation

## 2023-04-02 DIAGNOSIS — N182 Chronic kidney disease, stage 2 (mild): Secondary | ICD-10-CM | POA: Insufficient documentation

## 2023-04-02 DIAGNOSIS — N281 Cyst of kidney, acquired: Secondary | ICD-10-CM | POA: Diagnosis not present

## 2023-04-02 DIAGNOSIS — R1013 Epigastric pain: Secondary | ICD-10-CM | POA: Insufficient documentation

## 2023-04-02 DIAGNOSIS — R1084 Generalized abdominal pain: Secondary | ICD-10-CM | POA: Diagnosis not present

## 2023-04-02 DIAGNOSIS — K8020 Calculus of gallbladder without cholecystitis without obstruction: Secondary | ICD-10-CM | POA: Diagnosis not present

## 2023-04-02 DIAGNOSIS — I129 Hypertensive chronic kidney disease with stage 1 through stage 4 chronic kidney disease, or unspecified chronic kidney disease: Secondary | ICD-10-CM | POA: Insufficient documentation

## 2023-04-02 LAB — URINALYSIS, ROUTINE W REFLEX MICROSCOPIC
Bacteria, UA: NONE SEEN
Bilirubin Urine: NEGATIVE
Glucose, UA: 500 mg/dL — AB
Hgb urine dipstick: NEGATIVE
Ketones, ur: NEGATIVE mg/dL
Leukocytes,Ua: NEGATIVE
Nitrite: NEGATIVE
Specific Gravity, Urine: 1.016 (ref 1.005–1.030)
pH: 5.5 (ref 5.0–8.0)

## 2023-04-02 LAB — COMPREHENSIVE METABOLIC PANEL
ALT: 20 U/L (ref 0–44)
AST: 16 U/L (ref 15–41)
Albumin: 4.6 g/dL (ref 3.5–5.0)
Alkaline Phosphatase: 63 U/L (ref 38–126)
Anion gap: 8 (ref 5–15)
BUN: 16 mg/dL (ref 8–23)
CO2: 27 mmol/L (ref 22–32)
Calcium: 9.3 mg/dL (ref 8.9–10.3)
Chloride: 101 mmol/L (ref 98–111)
Creatinine, Ser: 1.1 mg/dL (ref 0.61–1.24)
GFR, Estimated: >60 mL/min (ref >60)
Glucose, Bld: 209 mg/dL — ABNORMAL HIGH (ref 70–99)
Potassium: 3.9 mmol/L (ref 3.5–5.1)
Sodium: 136 mmol/L (ref 135–145)
Total Bilirubin: 0.4 mg/dL (ref 0.0–1.2)
Total Protein: 7.9 g/dL (ref 6.5–8.1)

## 2023-04-02 LAB — LIPASE, BLOOD: Lipase: 27 U/L (ref 11–51)

## 2023-04-02 LAB — TROPONIN I (HIGH SENSITIVITY): Troponin I (High Sensitivity): 13 ng/L (ref <18)

## 2023-04-02 LAB — CBC
HCT: 44.1 % (ref 39.0–52.0)
Hemoglobin: 14.9 g/dL (ref 13.0–17.0)
MCH: 28.2 pg (ref 26.0–34.0)
MCHC: 33.8 g/dL (ref 30.0–36.0)
MCV: 83.5 fL (ref 80.0–100.0)
Platelets: 310 10*3/uL (ref 150–400)
RBC: 5.28 MIL/uL (ref 4.22–5.81)
RDW: 12.9 % (ref 11.5–15.5)
WBC: 11.2 10*3/uL — ABNORMAL HIGH (ref 4.0–10.5)
nRBC: 0 % (ref 0.0–0.2)

## 2023-04-02 MED ORDER — SODIUM CHLORIDE 0.9 % IV BOLUS
1000.0000 mL | Freq: Once | INTRAVENOUS | Status: AC
  Administered 2023-04-02: 1000 mL via INTRAVENOUS

## 2023-04-02 MED ORDER — ONDANSETRON HCL 4 MG/2ML IJ SOLN
4.0000 mg | Freq: Once | INTRAMUSCULAR | Status: AC
  Administered 2023-04-03: 4 mg via INTRAVENOUS
  Filled 2023-04-02: qty 2

## 2023-04-02 MED ORDER — HYDROMORPHONE HCL 1 MG/ML IJ SOLN
1.0000 mg | Freq: Once | INTRAMUSCULAR | Status: AC
  Administered 2023-04-03: 1 mg via INTRAVENOUS
  Filled 2023-04-02: qty 1

## 2023-04-02 MED ORDER — IOHEXOL 350 MG/ML SOLN
100.0000 mL | Freq: Once | INTRAVENOUS | Status: AC | PRN
  Administered 2023-04-02: 100 mL via INTRAVENOUS

## 2023-04-02 MED ORDER — ONDANSETRON HCL 4 MG/2ML IJ SOLN
4.0000 mg | Freq: Once | INTRAMUSCULAR | Status: AC
  Administered 2023-04-02: 4 mg via INTRAVENOUS
  Filled 2023-04-02: qty 2

## 2023-04-02 MED ORDER — HYDROMORPHONE HCL 1 MG/ML IJ SOLN
1.0000 mg | Freq: Once | INTRAMUSCULAR | Status: AC
  Administered 2023-04-02: 1 mg via INTRAVENOUS
  Filled 2023-04-02: qty 1

## 2023-04-02 NOTE — ED Provider Notes (Signed)
 [place] [facility] AT [facility] Provider Note   CSN: [id number] Arrival date & time: 04/02/23  2015     History  Chief Complaint  Patient presents with   Abdominal Pain   HPI John Andrade is a 68 y.o. male with history of CKD stage II, hyperlipidemia, type 2 diabetes, hypertension presenting for abdominal pain.  Started around 8:30 AM.  Patient states he drank a couple coffee and shortly after he started to have excruciating pain in his epigastric region extending to the his right upper quadrant.  States it lasted 45 minutes and went away.  States today after lunch the pain returned just after lunch and has not stopped.  He characterizes it a 10 out of 10 pain.  Endorses nausea and dry heaving but no vomiting or diarrhea.  Denies fever.  Denies chest pain and shortness of breath.   Abdominal Pain      Home Medications Prior to Admission medications   Medication Sig Start Date End Date Taking? Authorizing Provider  amLODipine  (NORVASC ) 10 MG tablet TAKE 1 TABLET BY MOUTH EVERY MORNING FOR BLOOD PRESSURE 03/15/23   Tonita Fallow, MD  Cholecalciferol (VITAMIN D3) 125 MCG (5000 UT) capsule Take 2 capsules ( 10,000 units)  Daily 04/12/22   Tonita Fallow, MD  ezetimibe  (ZETIA ) 10 MG tablet Take 1 tablet (10 mg total) by mouth daily. 02/01/23 02/01/24  Cranford, Tonya, NP  hydrochlorothiazide  (HYDRODIURIL ) 25 MG tablet TAKE 1 TABLET BY MOUTH EVERY MORNING FOR FOR BLOOD PRESSURE AND FLUID RETENTION/ANKLE SWELLING 03/14/23   Tonita Fallow, MD  L-Lysine 500 MG TABS Take 500 mg by mouth daily.     [provider]  loratadine  (CLARITIN ) 10 MG tablet Take 10 mg by mouth daily.    [provider]  meclizine  (ANTIVERT ) 25 MG tablet 1/2-1 pill up to 3 times daily as needed for motion sickness/dizziness Patient not taking: Reported on 01/30/2023 12/09/19   Jeanine Knee, NP  metFORMIN  (GLUCOPHAGE -XR) 500 MG 24 hr tablet Take 2 tablets 2 x /day with  Meals for Diabetes. Patient not taking: Reported on 01/30/2023 04/13/22   Tonita Fallow, MD  minoxidil  (LONITEN ) 10 MG tablet Take  1 tablet  at Night  for  BP 02/01/23   Tonita Fallow, MD  montelukast  (SINGULAIR ) 10 MG tablet Take  1 tablet  Daily for Allergies                                /                                                                   TAKE                                         BY                                                 MOUTH 02/03/23   Tonita Fallow, MD  olmesartan  (BENICAR ) 40 MG tablet TAKE 1 TABLET BY MOUTH DAILY FOR BLOOD PRESSURE 02/03/23   Tonita Fallow, MD  omeprazole  (PRILOSEC) 40 MG capsule Take  1 capsule  2 x /day  to Prevent Heartburn & Indigestion                                                  /                                                                   TAKE                                         BY                                                 MOUTH 02/03/23   Tonita Fallow, MD  Semaglutide ,0.25 or 0.5MG /DOS, 2 MG/3ML SOPN Inject 0.25 mg into the skin once a week. 01/30/23   Cranford, Tonya, NP      Allergies    Statins, Tylenol  [acetaminophen ], and Phentermine    Review of Systems   Review of Systems  Gastrointestinal:  Positive for abdominal pain.    Physical Exam Updated Vital Signs BP 125/72   Pulse 65   Temp 98.4 F (36.9 C) (Oral)   Resp 16   SpO2 97%  Physical Exam Vitals and nursing note reviewed.  HENT:     Head: Normocephalic and atraumatic.     Mouth/Throat:     Mouth: Mucous membranes are moist.  Eyes:     General:        Right eye: No discharge.        Left eye: No discharge.     Conjunctiva/sclera: Conjunctivae normal.  Cardiovascular:     Rate and Rhythm: Normal rate and regular rhythm.     Pulses: Normal pulses.     Heart sounds: Normal heart sounds.  Pulmonary:     Effort: Pulmonary effort is normal.     Breath sounds: Normal breath sounds.  Abdominal:     General: Abdomen is flat.      Palpations: Abdomen is soft.     Tenderness: There is abdominal tenderness in the right upper quadrant and epigastric area. There is guarding.  Skin:    General: Skin is warm and dry.  Neurological:     General: No focal deficit present.  Psychiatric:        Mood and Affect: Mood normal.     ED Results / Procedures / Treatments   Labs (all labs ordered are listed, but only abnormal results are displayed) Labs Reviewed  COMPREHENSIVE METABOLIC PANEL - Abnormal; Notable for the following components:      Result Value   Glucose, Bld 209 (*)    All other components within normal limits  CBC - Abnormal; Notable for the following components:  WBC 11.2 (*)    All other components within normal limits  URINALYSIS, ROUTINE W REFLEX MICROSCOPIC - Abnormal; Notable for the following components:   Glucose, UA 500 (*)    Protein, ur TRACE (*)    All other components within normal limits  LIPASE, BLOOD  TROPONIN I (HIGH SENSITIVITY)    EKG None  Radiology CT ABDOMEN PELVIS W CONTRAST Result Date: 04/02/2023 CLINICAL DATA:  Abdominal pain, acute, nonlocalized Patient presents with diffuse abd pain since this am. Reports pain progressed after eating at noon. Patient also endorsed nausea. EXAM: CT ABDOMEN AND PELVIS WITH CONTRAST TECHNIQUE: Multidetector CT imaging of the abdomen and pelvis was performed using the standard protocol following bolus administration of intravenous contrast. RADIATION DOSE REDUCTION: This exam was performed according to the departmental dose-optimization program which includes automated exposure control, adjustment of the mA and/or kV according to patient size and/or use of iterative reconstruction technique. CONTRAST:  OMNIPAQUE  IOHEXOL  350 MG/ML SOLN COMPARISON:  CT abdomen pelvis 04/15/2003 FINDINGS: Lower chest: No acute abnormality. Hepatobiliary: No focal liver abnormality. Calcified gallstone noted within the gallbladder lumen. No gallbladder wall  thickening or pericholecystic fluid. No biliary dilatation. Pancreas: No focal lesion. Normal pancreatic contour. No surrounding inflammatory changes. No main pancreatic ductal dilatation. Spleen: Normal in size without focal abnormality.  Splenule noted. Adrenals/Urinary Tract: No adrenal nodule bilaterally. Bilateral kidneys enhance symmetrically. Fluid density lesion of the right kidney likely represents a simple renal cyst. Simple renal cysts, in the absence of clinically indicated signs/symptoms, require no independent follow-up. No hydronephrosis. No hydroureter.  No nephroureterolithiasis. The urinary bladder is grossly unremarkable with limited evaluation due to overlying streak artifact from bilateral femoral surgical hardware. On delayed imaging, there is no urothelial wall thickening and there are no filling defects in the opacified portions of the bilateral collecting systems or ureters. Stomach/Bowel: Stomach is within normal limits. Second portion of the duodenum diverticula. Colonic diverticulosis. No evidence of bowel wall thickening or dilatation. Appendix appears normal. Vascular/Lymphatic: No abdominal aorta or iliac aneurysm. Moderate atherosclerotic plaque of the aorta and its branches. No abdominal, pelvic, or inguinal lymphadenopathy. Reproductive: Prostate enlarged measuring up to 4.7 cm. Other: No intraperitoneal free fluid. No intraperitoneal free gas. No organized fluid collection. Musculoskeletal: No abdominal wall hernia or abnormality. No suspicious lytic or blastic osseous lesions. No acute displaced fracture. Multilevel degenerative changes of the spine. Partially visualized total bilateral hip arthroplasty. L1-L2 posterolateral interbody surgical hardware fusion. L4-L5 and L5-S1 interbody surgical hardware fusion. Osseous central canal stenosis at the L2-L3 and L3-L4 levels. IMPRESSION: 1. Cholelithiasis with no CT evidence of acute cholecystitis. 2. Duodenal and colonic  diverticulosis with no acute diverticulitis. 3. Prostatomegaly. Electronically Signed   By: Morgane  Naveau M.D.   On: 04/02/2023 22:09    Procedures Procedures    Medications Ordered in ED Medications  HYDROmorphone  (DILAUDID ) injection 1 mg (1 mg Intravenous Given 04/02/23 2114)  sodium chloride  0.9 % bolus 1,000 mL (0 mLs Intravenous Stopped 04/02/23 2245)  ondansetron  (ZOFRAN ) injection 4 mg (4 mg Intravenous Given 04/02/23 2114)  iohexol  (OMNIPAQUE ) 350 MG/ML injection 100 mL (100 mLs Intravenous Contrast Given 04/02/23 2148)  HYDROmorphone  (DILAUDID ) injection 1 mg (1 mg Intravenous Given 04/03/23 0009)  ondansetron  (ZOFRAN ) injection 4 mg (4 mg Intravenous Given 04/03/23 0007)    ED Course/ Medical Decision Making/ A&P  Medical Decision Making Amount and/or Complexity of Data Reviewed Labs: ordered. Radiology: ordered.  Risk Prescription drug management.   Initial Impression and Ddx 68 year old well-appearing male presenting for abdominal tenderness.  Exam notable for epigastric and right upper quadrant tenderness.  DDx includes acute pancreatitis, acute cholecystitis, gallstone, kidney stone, ACS, other. Patient PMH that increases complexity of ED encounter:  history of CKD stage II, hyperlipidemia, type 2 diabetes, hypertension   Interpretation of Diagnostics - I independent reviewed and interpreted the labs as followed: Leukocytosis (11.2), hyperglycemia (209)  - I independently visualized the following imaging with scope of interpretation limited to determining acute life threatening conditions related to emergency care: CT ab/pelvis, which revealed cholelithiasis  -I personally reviewed interpret EKG which revealed sinus rhythm  Patient Reassessment and Ultimate Disposition/Management On reassessment, abdominal pain improved somewhat.  Discussed patient with Dr. Polly of general surgery who advised ultrasound since he is still having pain  and CT scan not convincing for acute cholecystitis.  Mention that if the ultrasound was reassuring that he could follow-up with general surgery outpatient.  However if there is suggestions of acute cholecystitis recommended admission to the hospital service for likely surgery. Transfer to ED for ultrasound and further evaluation. Dr. Lynwood Moulder is excepting ED attending.  Also ordered another 1 mg of Dilaudid  and Zofran  before transfer as patient was still in pain and nauseous.  Patient management required discussion with the following services or consulting groups:  General/Trauma Surgery  Complexity of Problems Addressed Acute complicated illness or Injury  Additional Data Reviewed and Analyzed Further history obtained from: Further history from spouse/family member, Past medical history and medications listed in the EMR, and Prior ED visit notes  Patient Encounter Risk Assessment Consideration of hospitalization         Final Clinical Impression(s) / ED Diagnoses Final diagnoses:  Abdominal pain, unspecified abdominal location    Rx / DC Orders ED Discharge Orders     None         Lang Norleen POUR, PA-C 04/03/23 0009    Dreama Longs, MD 04/04/23 1053

## 2023-04-02 NOTE — ED Notes (Signed)
 Patient transported to CT

## 2023-04-02 NOTE — ED Triage Notes (Signed)
 Patient presents with diffuse abd pain since this am. Reports pain progressed after eating at noon. Patient also endorsed nausea.

## 2023-04-03 ENCOUNTER — Emergency Department (HOSPITAL_COMMUNITY): Payer: Medicare Other

## 2023-04-03 DIAGNOSIS — R1011 Right upper quadrant pain: Secondary | ICD-10-CM | POA: Diagnosis not present

## 2023-04-03 DIAGNOSIS — Z79899 Other long term (current) drug therapy: Secondary | ICD-10-CM | POA: Diagnosis not present

## 2023-04-03 DIAGNOSIS — E1122 Type 2 diabetes mellitus with diabetic chronic kidney disease: Secondary | ICD-10-CM | POA: Diagnosis not present

## 2023-04-03 DIAGNOSIS — N182 Chronic kidney disease, stage 2 (mild): Secondary | ICD-10-CM | POA: Diagnosis not present

## 2023-04-03 DIAGNOSIS — K760 Fatty (change of) liver, not elsewhere classified: Secondary | ICD-10-CM | POA: Diagnosis not present

## 2023-04-03 DIAGNOSIS — D72829 Elevated white blood cell count, unspecified: Secondary | ICD-10-CM | POA: Diagnosis not present

## 2023-04-03 DIAGNOSIS — K8020 Calculus of gallbladder without cholecystitis without obstruction: Secondary | ICD-10-CM | POA: Diagnosis not present

## 2023-04-03 DIAGNOSIS — R109 Unspecified abdominal pain: Secondary | ICD-10-CM | POA: Diagnosis present

## 2023-04-03 DIAGNOSIS — Z7984 Long term (current) use of oral hypoglycemic drugs: Secondary | ICD-10-CM | POA: Diagnosis not present

## 2023-04-03 DIAGNOSIS — N281 Cyst of kidney, acquired: Secondary | ICD-10-CM | POA: Diagnosis not present

## 2023-04-03 DIAGNOSIS — R1013 Epigastric pain: Secondary | ICD-10-CM | POA: Diagnosis not present

## 2023-04-03 DIAGNOSIS — I129 Hypertensive chronic kidney disease with stage 1 through stage 4 chronic kidney disease, or unspecified chronic kidney disease: Secondary | ICD-10-CM | POA: Diagnosis not present

## 2023-04-03 MED ORDER — ONDANSETRON 4 MG PO TBDP: 4.0000 mg | ORAL_TABLET | Freq: Three times a day (TID) | ORAL | 0 refills | Status: DC | PRN

## 2023-04-03 MED ORDER — OXYCODONE HCL 5 MG PO TABS
5.0000 mg | ORAL_TABLET | Freq: Once | ORAL | Status: AC
  Administered 2023-04-03: 5 mg via ORAL
  Filled 2023-04-03: qty 1

## 2023-04-03 MED ORDER — OXYCODONE HCL 5 MG PO TABS: 5.0000 mg | ORAL_TABLET | Freq: Four times a day (QID) | ORAL | 0 refills | Status: DC | PRN

## 2023-04-03 NOTE — ED Notes (Signed)
 Patient tolerated drinking ginger ale. No complaints at this time.

## 2023-04-03 NOTE — ED Provider Notes (Signed)
 Patient transferred for ultrasound of right upper quadrant to rule out cholecystitis.  Patient had right upper abdominal pain that started this morning.  CT showed cholelithiasis without evidence of cholecystitis.  Labs reviewed and are notable for normal LFTs.  Normal lipase.  Mildly elevated leukocytosis at 11.2. Physical Exam  BP (!) 155/87   Pulse 66   Temp 98.4 F (36.9 C) (Oral)   Resp 16   SpO2 95%   Physical Exam There is some mild right upper quadrant abdominal tenderness Procedures  Procedures  ED Course / MDM    Medical Decision Making Amount and/or Complexity of Data Reviewed Labs: ordered. Radiology: ordered and independent interpretation performed.    Details: Gallstone present, no gallbladder wall thickening  Risk Prescription drug management.   Patient states that his pain is tolerable now.  He is tolerating oral intake.  Will have him follow-up with general surgery.  No evidence of cholecystitis seen on ultrasound.  I have given him prescription for oxycodone  and Zofran .  EKG reviewed and has normal QT.       Sherel Dikes, PA-C 04/03/23 0343    Kelsey Patricia, MD 04/03/23 [phone]

## 2023-04-03 NOTE — ED Notes (Signed)
 Pt arrived from [facility] for MRI. Abd pain RUQ 8/10 at present. Zero noted distress at present.

## 2023-04-03 NOTE — ED Notes (Signed)
 Patient transported to Ultrasound

## 2023-04-05 ENCOUNTER — Ambulatory Visit: Payer: Self-pay | Admitting: Surgery

## 2023-04-05 DIAGNOSIS — K8010 Calculus of gallbladder with chronic cholecystitis without obstruction: Secondary | ICD-10-CM | POA: Diagnosis not present

## 2023-04-05 NOTE — H&P (Signed)
DATE OF ENCOUNTER: 04/05/2023  Subjective   Chief Complaint: New Consultation (Eval of calculus of GB)     History of Present Illness: John Andrade is a 68 y.o. male who is seen today as an office consultation at the request of Dr. Room for evaluation of New Consultation (Eval of calculus of GB)  This is a 68 year old male who presents with several months of intermittent postprandial upper abdominal pain.  Last weekend, the patient woke up on Sunday morning and had a cup of coffee.  He had acute onset of some right upper quadrant abdominal pain.  This resolved within an hour.  Later that day he had lunch and had severe upper abdominal pain that wraps around his back.  This was associated with nausea and vomiting.  Patient has also noticed bloating and increased belching.  He presented to the emergency department for evaluation.  Liver function test were normal.  White blood cell count was mildly elevated.  CT scan showed cholelithiasis with no sign of acute cholecystitis.  This was confirmed on ultrasound.  The patient felt better and was discharged home.  He has been on a fairly limited diet.  He has no appetite.  He has fairly frequent intermittent right upper quadrant abdominal pain.  No fever.  Bowel movements have been normal.  He has not noticed any change in the color of his urine or bowel movements.  He presents now to discuss elective cholecystectomy.  Review of Systems: A complete review of systems was obtained from the patient.  I have reviewed this information and discussed as appropriate with the patient.  See HPI as well for other ROS.  Review of Systems  Constitutional: Negative.   HENT: Negative.    Eyes: Negative.   Respiratory: Negative.    Cardiovascular: Negative.   Gastrointestinal:  Positive for abdominal pain, nausea and vomiting.  Genitourinary: Negative.   Musculoskeletal:  Positive for back pain.  Skin: Negative.   Neurological: Negative.   Endo/Heme/Allergies:  Negative.   Psychiatric/Behavioral: Negative.        Medical History: Past Medical History:  Diagnosis Date   Hypertension     Patient Active Problem List  Diagnosis   Aortic atherosclerosis (CMS-HCC)   B12 deficiency   CKD stage 2 due to type 2 diabetes mellitus  (CMS/HHS-HCC)   DDD (degenerative disc disease), lumbar   GERD (gastroesophageal reflux disease)   Environmental allergies   Hyperlipidemia associated with type 2 diabetes mellitus  (CMS/HHS-HCC)   Hypertension   Morbidly obese (CMS/HHS-HCC)   Type 2 diabetes mellitus (CMS/HHS-HCC)   Vitamin D deficiency    Past Surgical History:  Procedure Laterality Date   Back Surgery     Elbow Surgery     Hand Surgery     JOINT REPLACEMENT     Shoulder Surgery       Allergies  Allergen Reactions   Statins-Hmg-Coa Reductase Inhibitors Other (See Comments)    "Makes him mean"   Tylenol [Pseudoephed-Dm-Acetaminophen] Unknown    Current Outpatient Medications on File Prior to Visit  Medication Sig Dispense Refill   amLODIPine (NORVASC) 10 MG tablet Take 1 tablet by mouth every morning     cholecalciferol (VITAMIN D3) 5,000 unit capsule Take 2 capsules ( 10,000 units)  Daily     ezetimibe (ZETIA) 10 mg tablet Take 1 tablet by mouth once daily     hydroCHLOROthiazide (HYDRODIURIL) 25 MG tablet TAKE 1 TABLET BY MOUTH EVERY MORNING FOR FOR BLOOD PRESSURE AND FLUID  RETENTION/ANKLE SWELLING     loratadine (CLARITIN) 10 mg tablet Take 10 mg by mouth once daily     lysine (L-LYSINE) 500 mg Tab Take 500 mg by mouth once daily     minoxidiL 10 MG tablet Take  1 tablet  at Night  for  BP     montelukast (SINGULAIR) 10 mg tablet Take  1 tablet  Daily for Allergies                                /                                                                   TAKE                                         BY                                                 MOUTH     olmesartan (BENICAR) 40 MG tablet Take 1 tablet by mouth once daily      omeprazole (PRILOSEC) 40 MG DR capsule Take  1 capsule  2 x /day  to Prevent Heartburn & Indigestion                                                  /                                                                   TAKE                                         BY                                                 MOUTH     No current facility-administered medications on file prior to visit.    Family History  Problem Relation Age of Onset   Obesity Father      Social History   Tobacco Use  Smoking Status Former   Types: Cigarettes  Smokeless Tobacco Never     Social History   Socioeconomic History   Marital status: Married  Tobacco Use   Smoking status: Former    Types: Cigarettes   Smokeless tobacco: Never  Vaping Use   Vaping status: Never Used  Substance and Sexual Activity   Alcohol use: Not Currently   Drug use: Never   Social Drivers of Health    Received from [facility]   Social Network  Housing Stability: Unknown (04/05/2023)   Housing Stability Vital Sign    Homeless in the Last Year: No    Objective:    Vitals:   04/05/23 1345  BP: 118/83  Pulse: 77  Temp: 37 C (98.6 F)  SpO2: 98%  Weight: (!) 113.7 kg (250 lb 9.6 oz)  Height: 167.6 cm (5\' 6" )  PainSc: 0-No pain    Body mass index is 40.45 kg/m.  Physical Exam   Constitutional:  WDWN in NAD, conversant, no obvious deformities; lying in bed comfortably Eyes:  Pupils equal, round; sclera anicteric; moist conjunctiva; no lid lag HENT:  Oral mucosa moist; good dentition  Neck:  No masses palpated, trachea midline; no thyromegaly Lungs:  CTA bilaterally; normal respiratory effort CV:  Regular rate and rhythm; no murmurs; extremities well-perfused with no edema Abd:  +bowel sounds, obese, soft, mildly tender in RUQ; no palpable organomegaly; no palpable hernias Musc:  normal gait; no apparent clubbing or cyanosis in extremities Lymphatic:  No palpable cervical or axillary  lymphadenopathy Skin:  Warm, dry; no sign of jaundice Psychiatric - alert and oriented x 4; calm mood and affect   Labs, Imaging and Diagnostic Testing:   04/02/23 - LFT's WNL   CLINICAL DATA:  Right upper quadrant pain.   EXAM: ULTRASOUND ABDOMEN LIMITED RIGHT UPPER QUADRANT   COMPARISON:  None Available.   FINDINGS: Gallbladder:   A 1.4 cm gallstone is seen within the dependent portion of the gallbladder lumen. The gallbladder wall measures 3.0 mm in thickness. No sonographic Murphy sign noted by sonographer.   Common bile duct:   Diameter: 3.7 mm   Liver:   No focal lesion identified. Diffusely increased echogenicity of the liver parenchyma is noted. Portal vein is patent on color Doppler imaging with normal direction of blood flow towards the liver.   Other: Of incidental note is the presence of a 1.9 cm x 1.9 cm x 1.8 cm exophytic simple right renal cyst.   IMPRESSION: 1. Cholelithiasis, without evidence of acute cholecystitis. 2. Hepatic steatosis. 3. Simple right renal cyst.     Electronically Signed   By: Aram Candela M.D.   On: 04/03/2023 02:37   CLINICAL DATA:  Abdominal pain, acute, nonlocalized Patient presents with diffuse abd pain since this am. Reports pain progressed after eating at noon. Patient also endorsed nausea.   EXAM: CT ABDOMEN AND PELVIS WITH CONTRAST   TECHNIQUE: Multidetector CT imaging of the abdomen and pelvis was performed using the standard protocol following bolus administration of intravenous contrast.   RADIATION DOSE REDUCTION: This exam was performed according to the departmental dose-optimization program which includes automated exposure control, adjustment of the mA and/or kV according to patient size and/or use of iterative reconstruction technique.   CONTRAST:  OMNIPAQUE IOHEXOL 350 MG/ML SOLN   COMPARISON:  CT abdomen pelvis 04/15/2003   FINDINGS: Lower chest: No acute abnormality.    Hepatobiliary: No focal liver abnormality. Calcified gallstone noted within the gallbladder lumen. No gallbladder wall thickening or pericholecystic fluid. No biliary dilatation.   Pancreas: No focal lesion. Normal pancreatic contour. No surrounding inflammatory changes. No main pancreatic ductal dilatation.   Spleen: Normal in size without focal abnormality.  Splenule noted.   Adrenals/Urinary Tract:   No adrenal nodule bilaterally.   Bilateral kidneys enhance symmetrically.  Fluid density lesion of the right kidney likely represents a simple renal cyst. Simple renal cysts, in the absence of clinically indicated signs/symptoms, require no independent follow-up.   No hydronephrosis. No hydroureter.  No nephroureterolithiasis.   The urinary bladder is grossly unremarkable with limited evaluation due to overlying streak artifact from bilateral femoral surgical hardware.   On delayed imaging, there is no urothelial wall thickening and there are no filling defects in the opacified portions of the bilateral collecting systems or ureters.   Stomach/Bowel: Stomach is within normal limits. Second portion of the duodenum diverticula. Colonic diverticulosis. No evidence of bowel wall thickening or dilatation. Appendix appears normal.   Vascular/Lymphatic: No abdominal aorta or iliac aneurysm. Moderate atherosclerotic plaque of the aorta and its branches. No abdominal, pelvic, or inguinal lymphadenopathy.   Reproductive: Prostate enlarged measuring up to 4.7 cm.   Other: No intraperitoneal free fluid. No intraperitoneal free gas. No organized fluid collection.   Musculoskeletal:   No abdominal wall hernia or abnormality.   No suspicious lytic or blastic osseous lesions. No acute displaced fracture. Multilevel degenerative changes of the spine. Partially visualized total bilateral hip arthroplasty. L1-L2 posterolateral interbody surgical hardware fusion. L4-L5 and L5-S1  interbody surgical hardware fusion. Osseous central canal stenosis at the L2-L3 and L3-L4 levels.   IMPRESSION: 1. Cholelithiasis with no CT evidence of acute cholecystitis. 2. Duodenal and colonic diverticulosis with no acute diverticulitis. 3. Prostatomegaly.     Electronically Signed   By: Tish Frederickson M.D.   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  Potential risks, benefits, alternative treatments, and expected outcomes have been explained.  All of the patient's questions at this time have been answered.  The likelihood of reaching the patient's treatment goal is good.  The patient understands the proposed surgical procedure and wishes to proceed. We will try to schedule surgery within the next 2 to 3 weeks due to his chronic symptoms.   Lissa Morales, MD  04/05/2023 2:59 PM

## 2023-04-05 NOTE — H&P (View-Only) (Signed)
 DATE OF ENCOUNTER: 04/05/2023  Subjective   Chief Complaint: New Consultation (Eval of calculus of GB)     History of Present Illness: John Andrade is a 68 y.o. male who is seen today as an office consultation at the request of Dr. Room for evaluation of New Consultation (Eval of calculus of GB)  This is a 68 year old male who presents with several months of intermittent postprandial upper abdominal pain.  Last weekend, the patient woke up on Sunday morning and had a cup of coffee.  He had acute onset of some right upper quadrant abdominal pain.  This resolved within an hour.  Later that day he had lunch and had severe upper abdominal pain that wraps around his back.  This was associated with nausea and vomiting.  Patient has also noticed bloating and increased belching.  He presented to the emergency department for evaluation.  Liver function test were normal.  White blood cell count was mildly elevated.  CT scan showed cholelithiasis with no sign of acute cholecystitis.  This was confirmed on ultrasound.  The patient felt better and was discharged home.  He has been on a fairly limited diet.  He has no appetite.  He has fairly frequent intermittent right upper quadrant abdominal pain.  No fever.  Bowel movements have been normal.  He has not noticed any change in the color of his urine or bowel movements.  He presents now to discuss elective cholecystectomy.  Review of Systems: A complete review of systems was obtained from the patient.  I have reviewed this information and discussed as appropriate with the patient.  See HPI as well for other ROS.  Review of Systems  Constitutional: Negative.   HENT: Negative.    Eyes: Negative.   Respiratory: Negative.    Cardiovascular: Negative.   Gastrointestinal:  Positive for abdominal pain, nausea and vomiting.  Genitourinary: Negative.   Musculoskeletal:  Positive for back pain.  Skin: Negative.   Neurological: Negative.   Endo/Heme/Allergies:  Negative.   Psychiatric/Behavioral: Negative.        Medical History: Past Medical History:  Diagnosis Date   Hypertension     Patient Active Problem List  Diagnosis   Aortic atherosclerosis (CMS-HCC)   B12 deficiency   CKD stage 2 due to type 2 diabetes mellitus  (CMS/HHS-HCC)   DDD (degenerative disc disease), lumbar   GERD (gastroesophageal reflux disease)   Environmental allergies   Hyperlipidemia associated with type 2 diabetes mellitus  (CMS/HHS-HCC)   Hypertension   Morbidly obese (CMS/HHS-HCC)   Type 2 diabetes mellitus (CMS/HHS-HCC)   Vitamin D deficiency    Past Surgical History:  Procedure Laterality Date   Back Surgery     Elbow Surgery     Hand Surgery     JOINT REPLACEMENT     Shoulder Surgery       Allergies  Allergen Reactions   Statins-Hmg-Coa Reductase Inhibitors Other (See Comments)    "Makes him mean"   Tylenol [Pseudoephed-Dm-Acetaminophen] Unknown    Current Outpatient Medications on File Prior to Visit  Medication Sig Dispense Refill   amLODIPine (NORVASC) 10 MG tablet Take 1 tablet by mouth every morning     cholecalciferol (VITAMIN D3) 5,000 unit capsule Take 2 capsules ( 10,000 units)  Daily     ezetimibe (ZETIA) 10 mg tablet Take 1 tablet by mouth once daily     hydroCHLOROthiazide (HYDRODIURIL) 25 MG tablet TAKE 1 TABLET BY MOUTH EVERY MORNING FOR FOR BLOOD PRESSURE AND FLUID  RETENTION/ANKLE SWELLING     loratadine (CLARITIN) 10 mg tablet Take 10 mg by mouth once daily     lysine (L-LYSINE) 500 mg Tab Take 500 mg by mouth once daily     minoxidiL 10 MG tablet Take  1 tablet  at Night  for  BP     montelukast (SINGULAIR) 10 mg tablet Take  1 tablet  Daily for Allergies                                /                                                                   TAKE                                         BY                                                 MOUTH     olmesartan (BENICAR) 40 MG tablet Take 1 tablet by mouth once daily      omeprazole (PRILOSEC) 40 MG DR capsule Take  1 capsule  2 x /day  to Prevent Heartburn & Indigestion                                                  /                                                                   TAKE                                         BY                                                 MOUTH     No current facility-administered medications on file prior to visit.    Family History  Problem Relation Age of Onset   Obesity Father      Social History   Tobacco Use  Smoking Status Former   Types: Cigarettes  Smokeless Tobacco Never     Social History   Socioeconomic History   Marital status: Married  Tobacco Use   Smoking status: Former    Types: Cigarettes   Smokeless tobacco: Never  Vaping Use   Vaping status: Never Used  Substance and Sexual Activity   Alcohol use: Not Currently   Drug use: Never   Social Drivers of Health    Received from [facility]   Social Network  Housing Stability: Unknown (04/05/2023)   Housing Stability Vital Sign    Homeless in the Last Year: No    Objective:    Vitals:   04/05/23 1345  BP: 118/83  Pulse: 77  Temp: 37 C (98.6 F)  SpO2: 98%  Weight: (!) 113.7 kg (250 lb 9.6 oz)  Height: 167.6 cm (5\' 6" )  PainSc: 0-No pain    Body mass index is 40.45 kg/m.  Physical Exam   Constitutional:  WDWN in NAD, conversant, no obvious deformities; lying in bed comfortably Eyes:  Pupils equal, round; sclera anicteric; moist conjunctiva; no lid lag HENT:  Oral mucosa moist; good dentition  Neck:  No masses palpated, trachea midline; no thyromegaly Lungs:  CTA bilaterally; normal respiratory effort CV:  Regular rate and rhythm; no murmurs; extremities well-perfused with no edema Abd:  +bowel sounds, obese, soft, mildly tender in RUQ; no palpable organomegaly; no palpable hernias Musc:  normal gait; no apparent clubbing or cyanosis in extremities Lymphatic:  No palpable cervical or axillary  lymphadenopathy Skin:  Warm, dry; no sign of jaundice Psychiatric - alert and oriented x 4; calm mood and affect   Labs, Imaging and Diagnostic Testing:   04/02/23 - LFT's WNL   CLINICAL DATA:  Right upper quadrant pain.   EXAM: ULTRASOUND ABDOMEN LIMITED RIGHT UPPER QUADRANT   COMPARISON:  None Available.   FINDINGS: Gallbladder:   A 1.4 cm gallstone is seen within the dependent portion of the gallbladder lumen. The gallbladder wall measures 3.0 mm in thickness. No sonographic Murphy sign noted by sonographer.   Common bile duct:   Diameter: 3.7 mm   Liver:   No focal lesion identified. Diffusely increased echogenicity of the liver parenchyma is noted. Portal vein is patent on color Doppler imaging with normal direction of blood flow towards the liver.   Other: Of incidental note is the presence of a 1.9 cm x 1.9 cm x 1.8 cm exophytic simple right renal cyst.   IMPRESSION: 1. Cholelithiasis, without evidence of acute cholecystitis. 2. Hepatic steatosis. 3. Simple right renal cyst.     Electronically Signed   By: Aram Candela M.D.   On: 04/03/2023 02:37   CLINICAL DATA:  Abdominal pain, acute, nonlocalized Patient presents with diffuse abd pain since this am. Reports pain progressed after eating at noon. Patient also endorsed nausea.   EXAM: CT ABDOMEN AND PELVIS WITH CONTRAST   TECHNIQUE: Multidetector CT imaging of the abdomen and pelvis was performed using the standard protocol following bolus administration of intravenous contrast.   RADIATION DOSE REDUCTION: This exam was performed according to the departmental dose-optimization program which includes automated exposure control, adjustment of the mA and/or kV according to patient size and/or use of iterative reconstruction technique.   CONTRAST:  OMNIPAQUE IOHEXOL 350 MG/ML SOLN   COMPARISON:  CT abdomen pelvis 04/15/2003   FINDINGS: Lower chest: No acute abnormality.    Hepatobiliary: No focal liver abnormality. Calcified gallstone noted within the gallbladder lumen. No gallbladder wall thickening or pericholecystic fluid. No biliary dilatation.   Pancreas: No focal lesion. Normal pancreatic contour. No surrounding inflammatory changes. No main pancreatic ductal dilatation.   Spleen: Normal in size without focal abnormality.  Splenule noted.   Adrenals/Urinary Tract:   No adrenal nodule bilaterally.   Bilateral kidneys enhance symmetrically.  Fluid density lesion of the right kidney likely represents a simple renal cyst. Simple renal cysts, in the absence of clinically indicated signs/symptoms, require no independent follow-up.   No hydronephrosis. No hydroureter.  No nephroureterolithiasis.   The urinary bladder is grossly unremarkable with limited evaluation due to overlying streak artifact from bilateral femoral surgical hardware.   On delayed imaging, there is no urothelial wall thickening and there are no filling defects in the opacified portions of the bilateral collecting systems or ureters.   Stomach/Bowel: Stomach is within normal limits. Second portion of the duodenum diverticula. Colonic diverticulosis. No evidence of bowel wall thickening or dilatation. Appendix appears normal.   Vascular/Lymphatic: No abdominal aorta or iliac aneurysm. Moderate atherosclerotic plaque of the aorta and its branches. No abdominal, pelvic, or inguinal lymphadenopathy.   Reproductive: Prostate enlarged measuring up to 4.7 cm.   Other: No intraperitoneal free fluid. No intraperitoneal free gas. No organized fluid collection.   Musculoskeletal:   No abdominal wall hernia or abnormality.   No suspicious lytic or blastic osseous lesions. No acute displaced fracture. Multilevel degenerative changes of the spine. Partially visualized total bilateral hip arthroplasty. L1-L2 posterolateral interbody surgical hardware fusion. L4-L5 and L5-S1  interbody surgical hardware fusion. Osseous central canal stenosis at the L2-L3 and L3-L4 levels.   IMPRESSION: 1. Cholelithiasis with no CT evidence of acute cholecystitis. 2. Duodenal and colonic diverticulosis with no acute diverticulitis. 3. Prostatomegaly.     Electronically Signed   By: Tish Frederickson M.D.   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  Potential risks, benefits, alternative treatments, and expected outcomes have been explained.  All of the patient's questions at this time have been answered.  The likelihood of reaching the patient's treatment goal is good.  The patient understands the proposed surgical procedure and wishes to proceed. We will try to schedule surgery within the next 2 to 3 weeks due to his chronic symptoms.   Lissa Morales, MD  04/05/2023 2:59 PM

## 2023-04-20 NOTE — Progress Notes (Signed)
 COVID Vaccine received:  [x]  No []  Yes Date of any COVID positive Test in last 90 days:  PCP - Lucky Cowboy, MD, Adela Glimpse, NP Cardiologist - Verdis Prime, MD)  Chest x-ray -7-18- 2017   2v  Epic EKG -  04-06-2023  Epic Stress Test -  ECHO -  Cardiac Cath - 2007  PCR screen: []  Ordered & Completed []   No Order but Needs PROFEND     [x]   N/A for this surgery  Surgery Plan:  [x]  Ambulatory   []  Outpatient in bed  []  Admit Anesthesia:    [x]  General  []  Spinal  []   Choice []   MAC  Bowel Prep - [x]  No  []   Yes ______  Pacemaker / ICD device [x]  No []  Yes   Spinal Cord Stimulator:[x]  No []  Yes       History of Sleep Apnea? [x]  No []  Yes   CPAP used?- [x]  No []  Yes    Does the patient monitor blood sugar?   []  N/A   []  No []  Yes  Patient has: []  NO Hx DM   []  Pre-DM   []  DM1  [x]   DM2 Last A1c was:  7.7  on  01-30-2023      Does patient have a [employer] or Dexcom? []  No []  Yes   Fasting Blood Sugar Ranges-  Checks Blood Sugar _____ times a day  Blood Thinner / Instructions:none Aspirin Instructions:  none  ERAS Protocol Ordered: []  No  [x]  Yes PRE-SURGERY []  ENSURE  []  G2   [x]  No Drink Ordered Patient is to be NPO after: 0930  Dental hx: []  Dentures:  []  N/A      []  Bridge or Partial:                   []  Loose or Damaged teeth:   Comments:   Activity level: Patient is able / unable to climb a flight of stairs without difficulty; []  No CP  []  No SOB, but would have ___   Patient can / can not perform ADLs without assistance.   Anesthesia review: uncontrolled DM2 (pt stopped taking Ozempic & metformin?), HTN, GERD, CKD2, Hx Trigeminy, PONV, Hx Phentermine use,  Fatty liver.   Patient denies shortness of breath, fever, cough and chest pain at PAT appointment.  Patient verbalized understanding and agreement to the Pre-Surgical Instructions that were given to them at this PAT appointment. Patient was also educated of the need to review these PAT instructions  again prior to his surgery.I reviewed the appropriate phone numbers to call if they have any and questions or concerns.

## 2023-04-20 NOTE — Patient Instructions (Signed)
 SURGICAL WAITING ROOM VISITATION Patients having surgery or a procedure may have no more than 2 support people in the waiting area - these visitors may rotate in the visitor waiting room.   Due to an increase in RSV and influenza rates and associated hospitalizations, children ages 32 and under may not visit patients in [facility] hospitals. If the patient needs to stay at the hospital during part of their recovery, the visitor guidelines for inpatient rooms apply.  PRE-OP VISITATION  Pre-op nurse will coordinate an appropriate time for 1 support person to accompany the patient in pre-op.  This support person may not rotate.  This visitor will be contacted when the time is appropriate for the visitor to come back in the pre-op area.  Please refer to the [facility] website for the visitor guidelines for Inpatients (after your surgery is over and you are in a regular room).  You are not required to quarantine at this time prior to your surgery. However, you must do this: Hand Hygiene often Do NOT share personal items Notify your provider if you are in close contact with someone who has COVID or you develop fever 100.4 or greater, new onset of sneezing, cough, sore throat, shortness of breath or body aches.  If you test positive for Covid or have been in contact with anyone that has tested positive in the last 10 days please notify you surgeon.    Your procedure is scheduled on:   Wednesday  April 26, 2023   Report to [facility] Main Entrance: [address] entrance where the [employer] is available.   Report to admitting at: 10:15    AM  Call this number if you have any questions or problems the morning of surgery [phone]  FOLLOW ANY ADDITIONAL PRE OP INSTRUCTIONS YOU RECEIVED FROM YOUR SURGEON'S OFFICE!!!  Do not eat food after Midnight the night prior to your surgery/procedure.  After Midnight you may have the following liquids until 09:30 AM DAY OF SURGERY  Clear Liquid  Diet Water Black Coffee (sugar ok, NO MILK/CREAM OR CREAMERS)  Tea (sugar ok, NO MILK/CREAM OR CREAMERS) regular and decaf                             Plain Jell-O  with no fruit (NO RED)                                           Fruit ices (not with fruit pulp, NO RED)                                     Popsicles (NO RED)                                                                  Juice: NO CITRUS JUICES: only apple, WHITE grape, WHITE cranberry Sports drinks like Gatorade or Powerade (NO RED)                 Oral Hygiene is also important to reduce your  risk of infection.        Remember - BRUSH YOUR TEETH THE MORNING OF SURGERY WITH YOUR REGULAR TOOTHPASTE  Do NOT smoke after Midnight the night before surgery.  STOP TAKING all Vitamins, Herbs and supplements 1 week before your surgery.   Take ONLY these medicines the morning of surgery with A SIP OF WATER: omeprazole, amlodipine, and oxycodone if needed for severe pain.                   You may not have any metal on your body including  jewelry, and body piercing  Do not wear lotions, powders, cologne, or deodorant  Men may shave face and neck.  Contacts, Hearing Aids, dentures or bridgework may not be worn into surgery. DENTURES WILL BE REMOVED PRIOR TO SURGERY PLEASE DO NOT APPLY "Poly grip" OR ADHESIVES!!!  Patients discharged on the day of surgery will not be allowed to drive home.  Someone NEEDS to stay with you for the first 24 hours after anesthesia.  Do not bring your home medications to the hospital. The Pharmacy will dispense medications listed on your medication list to you during your admission in the Hospital.  Please read over the following fact sheets you were given: IF YOU HAVE QUESTIONS ABOUT YOUR PRE-OP INSTRUCTIONS, PLEASE CALL [phone]   [facility] - Preparing for Surgery Before surgery, you can play an important role.  Because skin is not sterile, your skin needs to be as free of germs as  possible.  You can reduce the number of germs on your skin by washing with CHG (chlorahexidine gluconate) soap before surgery.  CHG is an antiseptic cleaner which kills germs and bonds with the skin to continue killing germs even after washing. Please DO NOT use if you have an allergy to CHG or antibacterial soaps.  If your skin becomes reddened/irritated stop using the CHG and inform your nurse when you arrive at Short Stay. Do not shave (including legs and underarms) for at least 48 hours prior to the first CHG shower.  You may shave your face/neck.  Please follow these instructions carefully:  1.  Shower with CHG Soap the night before surgery and the  morning of surgery.  2.  If you choose to wash your hair, wash your hair first as usual with your normal  shampoo.  3.  After you shampoo, rinse your hair and body thoroughly to remove the shampoo.                             4.  Use CHG as you would any other liquid soap.  You can apply chg directly to the skin and wash.  Gently with a scrungie or clean washcloth.  5.  Apply the CHG Soap to your body ONLY FROM THE NECK DOWN.   Do not use on face/ open                           Wound or open sores. Avoid contact with eyes, ears mouth and genitals (private parts).                       Wash face,  Genitals (private parts) with your normal soap.             6.  Wash thoroughly, paying special attention to the area where your  surgery  will  be performed.  7.  Thoroughly rinse your body with warm water from the neck down.  8.  DO NOT shower/wash with your normal soap after using and rinsing off the CHG Soap.            9.  Pat yourself dry with a clean towel.            10.  Wear clean pajamas.            11.  Place clean sheets on your bed the night of your first shower and do not  sleep with pets.  ON THE DAY OF SURGERY : Do not apply any lotions/deodorants the morning of surgery.  Please wear clean clothes to the hospital/surgery  center.     FAILURE TO FOLLOW THESE INSTRUCTIONS MAY RESULT IN THE CANCELLATION OF YOUR SURGERY  PATIENT SIGNATURE_________________________________  NURSE SIGNATURE__________________________________  ________________________________________________________________________

## 2023-04-21 ENCOUNTER — Encounter (HOSPITAL_COMMUNITY): Payer: Self-pay

## 2023-04-21 ENCOUNTER — Other Ambulatory Visit: Payer: Self-pay

## 2023-04-21 ENCOUNTER — Encounter (HOSPITAL_COMMUNITY)
Admission: RE | Admit: 2023-04-21 | Discharge: 2023-04-21 | Disposition: A | Discharge status: Home / Self Care | Payer: Medicare Other | Source: Ambulatory Visit | Attending: Surgery | Admitting: Surgery

## 2023-04-21 VITALS — BP 164/89 | HR 60 | Temp 98.0°F | Resp 18 | Ht 66.0 in | Wt 249.4 lb

## 2023-04-21 DIAGNOSIS — E669 Obesity, unspecified: Secondary | ICD-10-CM | POA: Diagnosis not present

## 2023-04-21 DIAGNOSIS — Z6841 Body Mass Index (BMI) 40.0 and over, adult: Secondary | ICD-10-CM | POA: Diagnosis not present

## 2023-04-21 DIAGNOSIS — E1122 Type 2 diabetes mellitus with diabetic chronic kidney disease: Secondary | ICD-10-CM | POA: Diagnosis not present

## 2023-04-21 DIAGNOSIS — Z87891 Personal history of nicotine dependence: Secondary | ICD-10-CM | POA: Diagnosis not present

## 2023-04-21 DIAGNOSIS — K219 Gastro-esophageal reflux disease without esophagitis: Secondary | ICD-10-CM | POA: Insufficient documentation

## 2023-04-21 DIAGNOSIS — K8010 Calculus of gallbladder with chronic cholecystitis without obstruction: Secondary | ICD-10-CM | POA: Diagnosis not present

## 2023-04-21 DIAGNOSIS — Z01812 Encounter for preprocedural laboratory examination: Secondary | ICD-10-CM | POA: Insufficient documentation

## 2023-04-21 DIAGNOSIS — I129 Hypertensive chronic kidney disease with stage 1 through stage 4 chronic kidney disease, or unspecified chronic kidney disease: Secondary | ICD-10-CM | POA: Insufficient documentation

## 2023-04-21 DIAGNOSIS — N182 Chronic kidney disease, stage 2 (mild): Secondary | ICD-10-CM | POA: Diagnosis not present

## 2023-04-21 DIAGNOSIS — T505X5S Adverse effect of appetite depressants, sequela: Secondary | ICD-10-CM

## 2023-04-21 DIAGNOSIS — K760 Fatty (change of) liver, not elsewhere classified: Secondary | ICD-10-CM | POA: Insufficient documentation

## 2023-04-21 DIAGNOSIS — Z01818 Encounter for other preprocedural examination: Secondary | ICD-10-CM

## 2023-04-21 HISTORY — DX: Chronic kidney disease, unspecified: N18.9

## 2023-04-21 LAB — CBC
HCT: 43.7 % (ref 39.0–52.0)
Hemoglobin: 14.3 g/dL (ref 13.0–17.0)
MCH: 28.3 pg (ref 26.0–34.0)
MCHC: 32.7 g/dL (ref 30.0–36.0)
MCV: 86.5 fL (ref 80.0–100.0)
Platelets: 311 10*3/uL (ref 150–400)
RBC: 5.05 MIL/uL (ref 4.22–5.81)
RDW: 12.6 % (ref 11.5–15.5)
WBC: 7.1 10*3/uL (ref 4.0–10.5)
nRBC: 0 % (ref 0.0–0.2)

## 2023-04-21 LAB — COMPREHENSIVE METABOLIC PANEL
ALT: 15 U/L (ref 0–44)
AST: 21 U/L (ref 15–41)
Albumin: 4 g/dL (ref 3.5–5.0)
Alkaline Phosphatase: 64 U/L (ref 38–126)
Anion gap: 8 (ref 5–15)
BUN: 22 mg/dL (ref 8–23)
CO2: 25 mmol/L (ref 22–32)
Calcium: 9.7 mg/dL (ref 8.9–10.3)
Chloride: 104 mmol/L (ref 98–111)
Creatinine, Ser: 1.15 mg/dL (ref 0.61–1.24)
GFR, Estimated: >60 mL/min (ref >60)
Glucose, Bld: 187 mg/dL — ABNORMAL HIGH (ref 70–99)
Potassium: 4.9 mmol/L (ref 3.5–5.1)
Sodium: 137 mmol/L (ref 135–145)
Total Bilirubin: 1.1 mg/dL (ref 0.0–1.2)
Total Protein: 7.8 g/dL (ref 6.5–8.1)

## 2023-04-21 LAB — HEMOGLOBIN A1C
Hgb A1c MFr Bld: 7 % — ABNORMAL HIGH (ref 4.8–5.6)
Mean Plasma Glucose: 154.2 mg/dL

## 2023-04-21 LAB — GLUCOSE, CAPILLARY: Glucose-Capillary: 179 mg/dL — ABNORMAL HIGH (ref 70–99)

## 2023-04-24 NOTE — Anesthesia Preprocedure Evaluation (Signed)
 Anesthesia Evaluation  Patient identified by MRN, date of birth, ID band Patient awake    Reviewed: Allergy & Precautions, NPO status , Patient's Chart, lab work & pertinent test results, reviewed documented beta blocker date and time   History of Anesthesia Complications (+) PONV and history of anesthetic complications  Airway Mallampati: I  TM Distance: >3 FB Neck ROM: Full    Dental no notable dental hx. (+) Caps, Dental Advisory Given, Teeth Intact   Pulmonary former smoker   Pulmonary exam normal breath sounds clear to auscultation       Cardiovascular hypertension, Pt. on medications Normal cardiovascular exam+ dysrhythmias  Rhythm:Regular Rate:Normal     Neuro/Psych  Neuromuscular disease  negative psych ROS   GI/Hepatic Neg liver ROS,GERD  Medicated,,Cholelithiasis- symptomatic   Endo/Other  diabetes, Type 2  Class 3 obesityHypoerliipidemia HbA1c 7.0  Renal/GU Renal diseaseHx/o renal calculi  negative genitourinary   Musculoskeletal  (+) Arthritis , Osteoarthritis,  DDD lumbar spine   Abdominal  (+) + obese  Peds  Hematology negative hematology ROS (+)   Anesthesia Other Findings   Reproductive/Obstetrics                             Anesthesia Physical Anesthesia Plan  ASA: 3  Anesthesia Plan: General   Post-op Pain Management: Precedex and Dilaudid IV   Induction: Intravenous  PONV Risk Score and Plan: 4 or greater and Treatment may vary due to age or medical condition, Ondansetron, Dexamethasone, TIVA and Scopolamine patch - Pre-op  Airway Management Planned: Oral ETT  Additional Equipment: None  Intra-op Plan:   Post-operative Plan: Extubation in OR  Informed Consent: I have reviewed the patients History and Physical, chart, labs and discussed the procedure including the risks, benefits and alternatives for the proposed anesthesia with the patient or authorized  representative who has indicated his/her understanding and acceptance.     Dental advisory given  Plan Discussed with: CRNA and Anesthesiologist  Anesthesia Plan Comments: (See PAT note from 2/28 by Sherlie Ban PA-C )        Anesthesia Quick Evaluation

## 2023-04-24 NOTE — Progress Notes (Signed)
 Case: [id number] Date/Time: 04/26/23 1215   Procedure: LAPAROSCOPIC CHOLECYSTECTOMY WITH INTRAOPERATIVE CHOLANGIOGRAM   Anesthesia type: General   Pre-op diagnosis: CHRONIC CALCULUS CHOLECYSTITIS   Location: [facility] ROOM 01 / [facility]   Surgeons: Manus Rudd, MD       DISCUSSION: John Andrade is a 68 yo male who presents to PAT prior to surgery above. PMH of former smoking, HTN, hx of vertigo, GERD, DM (A1c 7.0), CKD, arthritis s/p multiple orthopedics surgeries, obesity (BMI 40).  Prior anesthesia complications include PONV  Patient is treated for HTN although BP elevated at PAT visit. Patient also with hx of DM however due to loss of PCP it is currently untreated. A1c was 7.0.  VS: BP (!) 164/89   Pulse 60   Temp 36.7 C (Oral)   Resp 18   Ht 5\' 6"  (1.676 m)   Wt 113.1 kg   SpO2 99%   BMI 40.25 kg/m   PROVIDERS: Pcp, No   LABS: Labs reviewed: Acceptable for surgery. (all labs ordered are listed, but only abnormal results are displayed)  Labs Reviewed  HEMOGLOBIN A1C - Abnormal; Notable for the following components:      Result Value   Hgb A1c MFr Bld 7.0 (*)    All other components within normal limits  COMPREHENSIVE METABOLIC PANEL - Abnormal; Notable for the following components:   Glucose, Bld 187 (*)    All other components within normal limits  GLUCOSE, CAPILLARY - Abnormal; Notable for the following components:   Glucose-Capillary 179 (*)    All other components within normal limits  CBC     IMAGES:   EKG 04/06/23  NSR, rate 63 PACs  CV:  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    GERD (gastroesophageal reflux disease)    takes Omeprazole daily   History of kidney stones    Hyperlipidemia    Hypertension    takes Lisinopril and Amlodipine daily   Multiple allergies    takes Claritin and  Singulair daily   PONV (postoperative nausea and vomiting)    with his longer surgeries   Vertigo 10/15/2019   peripheral vertigo last episode was in 2019    Past Surgical History:  Procedure Laterality Date   ANKLE FRACTURE SURGERY  1995   right   BACK SURGERY  2002   lumb lam-fusion   BACK SURGERY  03-2014   Dr Channing Mutters in [place]   CARDIAC CATHETERIZATION     2007   CARPAL TUNNEL RELEASE Right 09/24/2019   Procedure: RIGHT CARPAL TUNNEL RELEASE;  Surgeon: Marcene Corning, MD;  [facility];  Service: Orthopedics;  Laterality: Right;   CARPAL TUNNEL RELEASE Left 10/22/2019   Procedure: LEFT CARPAL TUNNEL RELEASE;  Surgeon: Marcene Corning, MD;  [facility];  Service: Orthopedics;  Laterality: Left;   CHONDROPLASTY Left 10/28/2014   Procedure: CHONDROPLASTY;  Surgeon: Marcene Corning, MD;  Location: [facility];  Service: Orthopedics;  Laterality: Left;   COLONOSCOPY     ELBOW LIGAMENT RECONSTRUCTION Right 2015   [place]   KNEE ARTHROSCOPY Left 04/24/2012   Procedure: ARTHROSCOPY KNEE, PARTIAL MEDIAL MENISECTOMY AND CHONDROPLASTY;  Surgeon: [name], MD;  Location: [facility];  Service: Orthopedics;  Laterality: Left;   KNEE ARTHROSCOPY WITH MEDIAL MENISECTOMY  Left 10/28/2014   Procedure: LEFT ARTHROSCOPY KNEE, PARTIAL MEDIAL MENISECTOMY, CHONDROPLASTY;  Surgeon: Marcene Corning, MD;  Location: [facility];  Service: Orthopedics;  Laterality: Left;   SHOULDER ARTHROSCOPY WITH BICEPSTENOTOMY Right 04/13/2020   Procedure: SHOULDER ARTHROSCOPY WITH BICEPSTENOTOMY;  Surgeon: Marcene Corning, MD;  Location: [facility];  Service: Orthopedics;  Laterality: Right;   TOTAL HIP ARTHROPLASTY Right 09/22/2015   Procedure: TOTAL HIP ARTHROPLASTY ANTERIOR APPROACH;  Surgeon: Marcene Corning, MD;  [facility];  Service: Orthopedics;  Laterality: Right;   TOTAL HIP ARTHROPLASTY Left 01/05/2016   Procedure: LEFT TOTAL HIP ARTHROPLASTY  ANTERIOR APPROACH;  Surgeon: Marcene Corning, MD;  [facility];  Service: Orthopedics;  Laterality: Left;   TRIGGER FINGER RELEASE Right 09/24/2019   Procedure: RIGHT RELEASE TRIGGER FINGER/A-1 PULLEY;  Surgeon: Marcene Corning, MD;  [facility];  Service: Orthopedics;  Laterality: Right;   WISDOM TOOTH EXTRACTION      MEDICATIONS:  amLODipine (NORVASC) 10 MG tablet   Cholecalciferol (VITAMIN D3) 125 MCG (5000 UT) capsule   ezetimibe (ZETIA) 10 MG tablet   hydrochlorothiazide (HYDRODIURIL) 25 MG tablet   L-Lysine 500 MG TABS   loratadine (CLARITIN) 10 MG tablet   minoxidil (LONITEN) 10 MG tablet   montelukast (SINGULAIR) 10 MG tablet   olmesartan (BENICAR) 40 MG tablet   omeprazole (PRILOSEC) 40 MG capsule   ondansetron (ZOFRAN-ODT) 4 MG disintegrating tablet   oxyCODONE (ROXICODONE) 5 MG immediate release tablet   No current facility-administered medications for this encounter.   Marcille Blanco [facility] Short Stay/Anesthesiology [facility] Phone [phone] 04/24/2023 10:27 AM

## 2023-04-26 ENCOUNTER — Ambulatory Visit (HOSPITAL_COMMUNITY)

## 2023-04-26 ENCOUNTER — Observation Stay (HOSPITAL_COMMUNITY)
Admission: RE | Admit: 2023-04-26 | Discharge: 2023-04-27 | Disposition: A | Discharge status: Home / Self Care | Payer: Medicare Other | Attending: Surgery | Admitting: Surgery

## 2023-04-26 ENCOUNTER — Encounter (HOSPITAL_COMMUNITY): Payer: Self-pay | Admitting: Surgery

## 2023-04-26 ENCOUNTER — Other Ambulatory Visit: Payer: Self-pay

## 2023-04-26 ENCOUNTER — Ambulatory Visit (HOSPITAL_COMMUNITY): Admitting: Medical

## 2023-04-26 ENCOUNTER — Ambulatory Visit (HOSPITAL_BASED_OUTPATIENT_CLINIC_OR_DEPARTMENT_OTHER): Admitting: Certified Registered"

## 2023-04-26 ENCOUNTER — Encounter (HOSPITAL_COMMUNITY)
Admission: RE | Disposition: A | Discharge status: Home / Self Care | Payer: Self-pay | Source: Home / Self Care | Attending: Surgery

## 2023-04-26 DIAGNOSIS — E1122 Type 2 diabetes mellitus with diabetic chronic kidney disease: Secondary | ICD-10-CM | POA: Diagnosis not present

## 2023-04-26 DIAGNOSIS — K8010 Calculus of gallbladder with chronic cholecystitis without obstruction: Principal | ICD-10-CM | POA: Diagnosis present

## 2023-04-26 DIAGNOSIS — N182 Chronic kidney disease, stage 2 (mild): Secondary | ICD-10-CM

## 2023-04-26 DIAGNOSIS — E8889 Other specified metabolic disorders: Secondary | ICD-10-CM | POA: Insufficient documentation

## 2023-04-26 DIAGNOSIS — I129 Hypertensive chronic kidney disease with stage 1 through stage 4 chronic kidney disease, or unspecified chronic kidney disease: Secondary | ICD-10-CM | POA: Diagnosis not present

## 2023-04-26 DIAGNOSIS — Z87891 Personal history of nicotine dependence: Secondary | ICD-10-CM | POA: Insufficient documentation

## 2023-04-26 DIAGNOSIS — Z79899 Other long term (current) drug therapy: Secondary | ICD-10-CM | POA: Diagnosis not present

## 2023-04-26 DIAGNOSIS — Z01818 Encounter for other preprocedural examination: Secondary | ICD-10-CM

## 2023-04-26 DIAGNOSIS — K8012 Calculus of gallbladder with acute and chronic cholecystitis without obstruction: Secondary | ICD-10-CM | POA: Diagnosis present

## 2023-04-26 LAB — GLUCOSE, CAPILLARY
Glucose-Capillary: 184 mg/dL — ABNORMAL HIGH (ref 70–99)
Glucose-Capillary: 201 mg/dL — ABNORMAL HIGH (ref 70–99)
Glucose-Capillary: 226 mg/dL — ABNORMAL HIGH (ref 70–99)

## 2023-04-26 SURGERY — LAPAROSCOPIC CHOLECYSTECTOMY WITH INTRAOPERATIVE CHOLANGIOGRAM
Anesthesia: General

## 2023-04-26 MED ORDER — EPHEDRINE 5 MG/ML INJ
INTRAVENOUS | Status: AC
  Filled 2023-04-26: qty 5

## 2023-04-26 MED ORDER — PHENYLEPHRINE HCL-NACL 20-0.9 MG/250ML-% IV SOLN
INTRAVENOUS | Status: DC | PRN
  Administered 2023-04-26: 40 ug/min via INTRAVENOUS

## 2023-04-26 MED ORDER — ONDANSETRON HCL 4 MG/2ML IJ SOLN: 4.0000 mg | Freq: Once | INTRAMUSCULAR | Status: DC | PRN

## 2023-04-26 MED ORDER — LORATADINE 10 MG PO TABS
10.0000 mg | ORAL_TABLET | Freq: Every day | ORAL | Status: DC
Start: 2023-04-26 — End: 2023-04-27
  Administered 2023-04-27: 10 mg via ORAL
  Filled 2023-04-26: qty 1

## 2023-04-26 MED ORDER — DIPHENHYDRAMINE HCL 12.5 MG/5ML PO ELIX: 12.5000 mg | ORAL_SOLUTION | Freq: Four times a day (QID) | ORAL | Status: DC | PRN

## 2023-04-26 MED ORDER — OXYCODONE HCL 5 MG PO TABS
ORAL_TABLET | ORAL | Status: AC
  Administered 2023-04-26: 5 mg via ORAL
  Filled 2023-04-26: qty 1

## 2023-04-26 MED ORDER — SUGAMMADEX SODIUM 200 MG/2ML IV SOLN
INTRAVENOUS | Status: AC
  Filled 2023-04-26: qty 4

## 2023-04-26 MED ORDER — ORAL CARE MOUTH RINSE: 15.0000 mL | Freq: Once | OROMUCOSAL | Status: AC

## 2023-04-26 MED ORDER — LACTATED RINGERS IV SOLN: INTRAVENOUS | Status: DC

## 2023-04-26 MED ORDER — ROCURONIUM BROMIDE 10 MG/ML (PF) SYRINGE
PREFILLED_SYRINGE | INTRAVENOUS | Status: AC
  Filled 2023-04-26: qty 10

## 2023-04-26 MED ORDER — IRBESARTAN 150 MG PO TABS
300.0000 mg | ORAL_TABLET | Freq: Every day | ORAL | Status: DC
  Administered 2023-04-26: 300 mg via ORAL
  Filled 2023-04-26: qty 2

## 2023-04-26 MED ORDER — MIDAZOLAM HCL 2 MG/2ML IJ SOLN
INTRAMUSCULAR | Status: AC
  Filled 2023-04-26: qty 2

## 2023-04-26 MED ORDER — CHLORHEXIDINE GLUCONATE 0.12 % MT SOLN
15.0000 mL | Freq: Once | OROMUCOSAL | Status: AC
  Administered 2023-04-26: 15 mL via OROMUCOSAL

## 2023-04-26 MED ORDER — PHENYLEPHRINE HCL (PRESSORS) 10 MG/ML IV SOLN
INTRAVENOUS | Status: AC
Start: 2023-04-26 — End: ?
  Filled 2023-04-26: qty 1

## 2023-04-26 MED ORDER — PHENYLEPHRINE 80 MCG/ML (10ML) SYRINGE FOR IV PUSH (FOR BLOOD PRESSURE SUPPORT)
PREFILLED_SYRINGE | INTRAVENOUS | Status: DC | PRN
  Administered 2023-04-26: 80 ug via INTRAVENOUS
  Administered 2023-04-26 (×2): 160 ug via INTRAVENOUS
  Administered 2023-04-26 (×3): 80 ug via INTRAVENOUS
  Administered 2023-04-26: 160 ug via INTRAVENOUS

## 2023-04-26 MED ORDER — FENTANYL CITRATE (PF) 100 MCG/2ML IJ SOLN
INTRAMUSCULAR | Status: AC
  Filled 2023-04-26: qty 2

## 2023-04-26 MED ORDER — HYDROMORPHONE HCL 1 MG/ML IJ SOLN
INTRAMUSCULAR | Status: AC
  Administered 2023-04-26: 0.25 mg via INTRAVENOUS
  Filled 2023-04-26: qty 1

## 2023-04-26 MED ORDER — ONDANSETRON HCL 4 MG/2ML IJ SOLN: 4.0000 mg | Freq: Four times a day (QID) | INTRAMUSCULAR | Status: DC | PRN

## 2023-04-26 MED ORDER — AMLODIPINE BESYLATE 10 MG PO TABS
10.0000 mg | ORAL_TABLET | Freq: Every day | ORAL | Status: DC
  Administered 2023-04-27: 10 mg via ORAL
  Filled 2023-04-26: qty 1

## 2023-04-26 MED ORDER — CHLORHEXIDINE GLUCONATE CLOTH 2 % EX PADS: 6.0000 | MEDICATED_PAD | Freq: Once | CUTANEOUS | Status: DC

## 2023-04-26 MED ORDER — ACETAMINOPHEN 650 MG RE SUPP: 650.0000 mg | Freq: Four times a day (QID) | RECTAL | Status: DC | PRN

## 2023-04-26 MED ORDER — DEXAMETHASONE SODIUM PHOSPHATE 10 MG/ML IJ SOLN
INTRAMUSCULAR | Status: DC | PRN
  Administered 2023-04-26: 10 mg via INTRAVENOUS

## 2023-04-26 MED ORDER — MORPHINE SULFATE (PF) 4 MG/ML IV SOLN: 4.0000 mg | INTRAVENOUS | Status: DC | PRN

## 2023-04-26 MED ORDER — OXIDIZED CELLULOSE EX PADS
MEDICATED_PAD | CUTANEOUS | Status: DC | PRN
  Administered 2023-04-26: 1

## 2023-04-26 MED ORDER — MINOXIDIL 10 MG PO TABS
10.0000 mg | ORAL_TABLET | Freq: Every day | ORAL | Status: DC
  Administered 2023-04-27: 10 mg via ORAL
  Filled 2023-04-26: qty 1

## 2023-04-26 MED ORDER — BUPIVACAINE-EPINEPHRINE (PF) 0.25% -1:200000 IJ SOLN
INTRAMUSCULAR | Status: AC
  Filled 2023-04-26: qty 30

## 2023-04-26 MED ORDER — PROPOFOL 10 MG/ML IV BOLUS
INTRAVENOUS | Status: DC | PRN
  Administered 2023-04-26: 200 mg via INTRAVENOUS

## 2023-04-26 MED ORDER — POTASSIUM CHLORIDE IN NACL 20-0.9 MEQ/L-% IV SOLN
INTRAVENOUS | Status: DC
  Filled 2023-04-26: qty 1000

## 2023-04-26 MED ORDER — DROPERIDOL 2.5 MG/ML IJ SOLN: 0.6250 mg | Freq: Once | INTRAMUSCULAR | Status: AC | PRN

## 2023-04-26 MED ORDER — INSULIN ASPART 100 UNIT/ML IJ SOLN
INTRAMUSCULAR | Status: AC
Start: 2023-04-26 — End: 2023-04-26
  Filled 2023-04-26: qty 1

## 2023-04-26 MED ORDER — EPHEDRINE SULFATE-NACL 50-0.9 MG/10ML-% IV SOSY
PREFILLED_SYRINGE | INTRAVENOUS | Status: DC | PRN
  Administered 2023-04-26 (×2): 5 mg via INTRAVENOUS
  Administered 2023-04-26: 10 mg via INTRAVENOUS
  Administered 2023-04-26 (×3): 5 mg via INTRAVENOUS

## 2023-04-26 MED ORDER — BUPIVACAINE-EPINEPHRINE 0.25% -1:200000 IJ SOLN
INTRAMUSCULAR | Status: DC | PRN
  Administered 2023-04-26: 17 mL

## 2023-04-26 MED ORDER — SCOPOLAMINE 1 MG/3DAYS TD PT72
1.0000 | MEDICATED_PATCH | TRANSDERMAL | Status: DC
  Administered 2023-04-26: 1.5 mg via TRANSDERMAL

## 2023-04-26 MED ORDER — METOPROLOL TARTRATE 5 MG/5ML IV SOLN: 5.0000 mg | Freq: Four times a day (QID) | INTRAVENOUS | Status: DC | PRN

## 2023-04-26 MED ORDER — MONTELUKAST SODIUM 10 MG PO TABS
10.0000 mg | ORAL_TABLET | Freq: Every day | ORAL | Status: DC
  Administered 2023-04-26: 10 mg via ORAL
  Filled 2023-04-26: qty 1

## 2023-04-26 MED ORDER — FENTANYL CITRATE (PF) 100 MCG/2ML IJ SOLN
INTRAMUSCULAR | Status: DC | PRN
  Administered 2023-04-26 (×2): 25 ug via INTRAVENOUS
  Administered 2023-04-26: 100 ug via INTRAVENOUS
  Administered 2023-04-26: 50 ug via INTRAVENOUS

## 2023-04-26 MED ORDER — ROCURONIUM BROMIDE 10 MG/ML (PF) SYRINGE
PREFILLED_SYRINGE | INTRAVENOUS | Status: DC | PRN
Start: 2023-04-26 — End: 2023-04-26
  Administered 2023-04-26: 20 mg via INTRAVENOUS
  Administered 2023-04-26: 70 mg via INTRAVENOUS
  Administered 2023-04-26: 30 mg via INTRAVENOUS

## 2023-04-26 MED ORDER — DIPHENHYDRAMINE HCL 50 MG/ML IJ SOLN: 12.5000 mg | Freq: Four times a day (QID) | INTRAMUSCULAR | Status: DC | PRN

## 2023-04-26 MED ORDER — LIDOCAINE HCL (CARDIAC) PF 100 MG/5ML IV SOSY
PREFILLED_SYRINGE | INTRAVENOUS | Status: DC | PRN
  Administered 2023-04-26: 100 mg via INTRATRACHEAL

## 2023-04-26 MED ORDER — CEFAZOLIN SODIUM-DEXTROSE 2-4 GM/100ML-% IV SOLN
2.0000 g | INTRAVENOUS | Status: AC
  Administered 2023-04-26: 2 g via INTRAVENOUS
  Filled 2023-04-26: qty 100

## 2023-04-26 MED ORDER — SUGAMMADEX SODIUM 200 MG/2ML IV SOLN
INTRAVENOUS | Status: DC | PRN
  Administered 2023-04-26: 300 mg via INTRAVENOUS
  Administered 2023-04-26: 100 mg via INTRAVENOUS

## 2023-04-26 MED ORDER — EZETIMIBE 10 MG PO TABS
10.0000 mg | ORAL_TABLET | ORAL | Status: DC
  Administered 2023-04-27: 10 mg via ORAL
  Filled 2023-04-26: qty 1

## 2023-04-26 MED ORDER — INSULIN ASPART 100 UNIT/ML IJ SOLN
0.0000 [IU] | INTRAMUSCULAR | Status: DC | PRN
  Administered 2023-04-26: 2 [IU] via SUBCUTANEOUS

## 2023-04-26 MED ORDER — ACETAMINOPHEN 325 MG PO TABS
650.0000 mg | ORAL_TABLET | Freq: Four times a day (QID) | ORAL | Status: DC | PRN
  Filled 2023-04-26: qty 2

## 2023-04-26 MED ORDER — HYDROMORPHONE HCL 1 MG/ML IJ SOLN
0.2500 mg | INTRAMUSCULAR | Status: DC | PRN
  Administered 2023-04-26: 0.5 mg via INTRAVENOUS
  Administered 2023-04-26 (×2): 0.25 mg via INTRAVENOUS

## 2023-04-26 MED ORDER — OXYCODONE HCL 5 MG PO TABS
5.0000 mg | ORAL_TABLET | ORAL | Status: DC | PRN
  Administered 2023-04-26 – 2023-04-27 (×3): 10 mg via ORAL
  Filled 2023-04-26 (×2): qty 2
  Filled 2023-04-26 (×2): qty 1

## 2023-04-26 MED ORDER — HYDROMORPHONE HCL 2 MG/ML IJ SOLN
INTRAMUSCULAR | Status: AC
  Filled 2023-04-26: qty 1

## 2023-04-26 MED ORDER — PHENYLEPHRINE 80 MCG/ML (10ML) SYRINGE FOR IV PUSH (FOR BLOOD PRESSURE SUPPORT)
PREFILLED_SYRINGE | INTRAVENOUS | Status: AC
  Filled 2023-04-26: qty 10

## 2023-04-26 MED ORDER — HYDROMORPHONE HCL 1 MG/ML IJ SOLN
INTRAMUSCULAR | Status: DC | PRN
  Administered 2023-04-26 (×2): .5 mg via INTRAVENOUS

## 2023-04-26 MED ORDER — SCOPOLAMINE 1 MG/3DAYS TD PT72
MEDICATED_PATCH | TRANSDERMAL | Status: AC
  Filled 2023-04-26: qty 1

## 2023-04-26 MED ORDER — ONDANSETRON HCL 4 MG/2ML IJ SOLN
INTRAMUSCULAR | Status: DC | PRN
  Administered 2023-04-26: 4 mg via INTRAVENOUS

## 2023-04-26 MED ORDER — ACETAMINOPHEN 500 MG PO TABS
1000.0000 mg | ORAL_TABLET | ORAL | Status: DC
  Filled 2023-04-26: qty 2

## 2023-04-26 MED ORDER — DROPERIDOL 2.5 MG/ML IJ SOLN
INTRAMUSCULAR | Status: AC
Start: 2023-04-26 — End: 2023-04-26
  Administered 2023-04-26: 0.625 mg via INTRAVENOUS
  Filled 2023-04-26: qty 2

## 2023-04-26 MED ORDER — OXYCODONE HCL 5 MG PO TABS: 5.0000 mg | ORAL_TABLET | Freq: Once | ORAL | Status: DC | PRN

## 2023-04-26 MED ORDER — HYDROMORPHONE HCL 1 MG/ML IJ SOLN
INTRAMUSCULAR | Status: AC
  Administered 2023-04-26: 0.5 mg via INTRAVENOUS
  Filled 2023-04-26: qty 2

## 2023-04-26 MED ORDER — PHENYLEPHRINE HCL (PRESSORS) 10 MG/ML IV SOLN
INTRAVENOUS | Status: AC
  Filled 2023-04-26: qty 1

## 2023-04-26 MED ORDER — OXYCODONE HCL 5 MG/5ML PO SOLN: 5.0000 mg | Freq: Once | ORAL | Status: DC | PRN

## 2023-04-26 MED ORDER — ONDANSETRON 4 MG PO TBDP: 4.0000 mg | ORAL_TABLET | Freq: Four times a day (QID) | ORAL | Status: DC | PRN

## 2023-04-26 MED ORDER — TRAMADOL HCL 50 MG PO TABS
50.0000 mg | ORAL_TABLET | Freq: Four times a day (QID) | ORAL | Status: DC | PRN
  Administered 2023-04-26: 50 mg via ORAL
  Filled 2023-04-26: qty 1

## 2023-04-26 SURGICAL SUPPLY — 42 items
APPLICATOR SURGIFLO ENDO (HEMOSTASIS) IMPLANT
APPLIER CLIP ROT 10 11.4 M/L (STAPLE) ×1 IMPLANT
BENZOIN TINCTURE PRP APPL 2/3 (GAUZE/BANDAGES/DRESSINGS) ×1 IMPLANT
CABLE HIGH FREQUENCY MONO STRZ (ELECTRODE) ×1 IMPLANT
CHLORAPREP W/TINT 26 (MISCELLANEOUS) ×1 IMPLANT
CLIP APPLIE ROT 10 11.4 M/L (STAPLE) ×1 IMPLANT
COVER MAYO STAND XLG (MISCELLANEOUS) ×1 IMPLANT
COVER SURGICAL LIGHT HANDLE (MISCELLANEOUS) ×1 IMPLANT
DRAIN CHANNEL 19F RND (DRAIN) IMPLANT
DRAPE C-ARM 42X120 X-RAY (DRAPES) ×1 IMPLANT
DRSG TEGADERM 2-3/8X2-3/4 SM (GAUZE/BANDAGES/DRESSINGS) ×3 IMPLANT
DRSG TEGADERM 4X4.75 (GAUZE/BANDAGES/DRESSINGS) ×1 IMPLANT
ELECT REM PT RETURN 15FT ADLT (MISCELLANEOUS) ×1 IMPLANT
ENDOLOOP SUT PDS II 0 18 (SUTURE) IMPLANT
EVACUATOR SILICONE 100CC (DRAIN) IMPLANT
GAUZE SPONGE 2X2 8PLY STRL LF (GAUZE/BANDAGES/DRESSINGS) IMPLANT
GLOVE BIO SURGEON STRL SZ7 (GLOVE) ×1 IMPLANT
GLOVE BIOGEL PI IND STRL 7.5 (GLOVE) ×1 IMPLANT
GOWN STRL REUS W/ TWL LRG LVL3 (GOWN DISPOSABLE) ×1 IMPLANT
IRRIG SUCT STRYKERFLOW 2 WTIP (MISCELLANEOUS) ×1 IMPLANT
IRRIGATION SUCT STRKRFLW 2 WTP (MISCELLANEOUS) ×1 IMPLANT
KIT BASIN OR (CUSTOM PROCEDURE TRAY) ×1 IMPLANT
KIT TURNOVER KIT A (KITS) IMPLANT
NS IRRIG 1000ML POUR BTL (IV SOLUTION) ×1 IMPLANT
POUCH RETRIEVAL ECOSAC 10 (ENDOMECHANICALS) IMPLANT
SCISSORS LAP 5X35 DISP (ENDOMECHANICALS) ×1 IMPLANT
SET CHOLANGIOGRAPH MIX (MISCELLANEOUS) ×1 IMPLANT
SET TUBE SMOKE EVAC HIGH FLOW (TUBING) ×1 IMPLANT
SLEEVE Z-THREAD 5X100MM (TROCAR) ×1 IMPLANT
SPIKE FLUID TRANSFER (MISCELLANEOUS) ×1 IMPLANT
STRIP CLOSURE SKIN 1/2X4 (GAUZE/BANDAGES/DRESSINGS) ×1 IMPLANT
SURGIFLO W/THROMBIN 8M KIT (HEMOSTASIS) IMPLANT
SUT ETHILON 2 0 PS N (SUTURE) IMPLANT
SUT MNCRL AB 4-0 PS2 18 (SUTURE) ×1 IMPLANT
SUT VICRYL 0 UR6 27IN ABS (SUTURE) IMPLANT
SYS BAG RETRIEVAL 10MM (BASKET) ×1 IMPLANT
SYSTEM BAG RETRIEVAL 10MM (BASKET) IMPLANT
TOWEL OR 17X26 10 PK STRL BLUE (TOWEL DISPOSABLE) ×1 IMPLANT
TRAY LAPAROSCOPIC (CUSTOM PROCEDURE TRAY) ×1 IMPLANT
TROCAR 11X100 Z THREAD (TROCAR) ×1 IMPLANT
TROCAR BALLN 12MMX100 BLUNT (TROCAR) ×1 IMPLANT
TROCAR Z-THREAD OPTICAL 5X100M (TROCAR) ×1 IMPLANT

## 2023-04-26 NOTE — Transfer of Care (Signed)
 Immediate Anesthesia Transfer of Care Note  Patient: John Andrade  Procedure(s) Performed: Procedure(s): LAPAROSCOPIC CHOLECYSTECTOMY (N/A)  Patient Location: PACU  Anesthesia Type:General  Level of Consciousness: Patient easily awoken, comfortable, cooperative, following commands, responds to stimulation.   Airway & Oxygen Therapy: Patient spontaneously breathing, ventilating well, oxygen via simple oxygen mask.  Post-op Assessment: Report given to PACU RN, vital signs reviewed and stable, moving all extremities.   Post vital signs: Reviewed and stable.  Complications: No apparent anesthesia complications Last Vitals:  Vitals Value Taken Time  BP 124/76 04/26/23 1416  Temp    Pulse 70 04/26/23 1419  Resp 17 04/26/23 1419  SpO2 98 % 04/26/23 1419  Vitals shown include unfiled device data.  Last Pain:  Vitals:   04/26/23 1024  TempSrc: Oral  PainSc: 0-No pain         Complications: No notable events documented.

## 2023-04-26 NOTE — Anesthesia Procedure Notes (Addendum)
 Procedure Name: Intubation Date/Time: 04/26/2023 11:55 AM  Performed by: Ludwig Lean, CRNAPre-anesthesia Checklist: Patient identified, Emergency Drugs available, Suction available and Patient being monitored Patient Re-evaluated:Patient Re-evaluated prior to induction Oxygen Delivery Method: Circle system utilized Preoxygenation: Pre-oxygenation with 100% oxygen Induction Type: IV induction Ventilation: Mask ventilation without difficulty Laryngoscope Size: Mac and 3 Grade View: Grade I Tube type: Oral Tube size: 7.5 mm Number of attempts: 1 Airway Equipment and Method: Stylet and Oral airway Placement Confirmation: ETT inserted through vocal cords under direct vision, positive ETCO2 and breath sounds checked- equal and bilateral Secured at: 21 cm Tube secured with: Tape Dental Injury: Teeth and Oropharynx as per pre-operative assessment

## 2023-04-26 NOTE — Op Note (Signed)
 Laparoscopic Cholecystectomy Procedure Note  Indications: This is a 68 year old male who presents with several months of intermittent postprandial upper abdominal pain. Last weekend, the patient woke up on Sunday morning and had a cup of coffee. He had acute onset of some right upper quadrant abdominal pain. This resolved within an hour. Later that day he had lunch and had severe upper abdominal pain that wraps around his back. This was associated with nausea and vomiting. Patient has also noticed bloating and increased belching. He presented to the emergency department for evaluation. Liver function test were normal. White blood cell count was mildly elevated. CT scan showed cholelithiasis with no sign of acute cholecystitis. This was confirmed on ultrasound. The patient felt better and was discharged home. He has been on a fairly limited diet. He has no appetite. He has fairly frequent intermittent right upper quadrant abdominal pain. No fever. Bowel movements have been normal. He has not noticed any change in the color of his urine or bowel movements. He presents now to discuss elective cholecystectomy.   Pre-operative Diagnosis: Calculus of gallbladder with other cholecystitis, without mention of obstruction  Post-operative Diagnosis: Same/ hepatic steatosis  Surgeon: [name]   Assistants: Dr. Phylliss Blakes  Anesthesia: General endotracheal anesthesia  ASA Class: 2  Operative findings: The patient has hepatomegaly with obvious visible fatty infiltration of the liver.  The gallbladder is chronically scarred with some omental adhesions but is located in a very intrahepatic location.  The entire liver seems swollen compared to a normal-appearing liver.  The liver capsule was quite friable and there are multiple punctures of the hepatic parenchyma from gentle retraction.  Procedure Details  The patient was seen again in the Holding Room. The risks, benefits, complications, treatment  options, and expected outcomes were discussed with the patient. The possibilities of reaction to medication, pulmonary aspiration, perforation of viscus, bleeding, recurrent infection, finding a normal gallbladder, the need for additional procedures, failure to diagnose a condition, the possible need to convert to an open procedure, and creating a complication requiring transfusion or operation were discussed with the patient. The likelihood of improving the patient's symptoms with return to their baseline status is good.  The patient and/or family concurred with the proposed plan, giving informed consent. The site of surgery properly noted. The patient was taken to Operating Room, identified as John Andrade and the procedure verified as Laparoscopic Cholecystectomy with Intraoperative Cholangiogram. A Time Out was held and the above information confirmed.  Prior to the induction of general anesthesia, antibiotic prophylaxis was administered. General endotracheal anesthesia was then administered and tolerated well. After the induction, the abdomen was prepped with Chloraprep and draped in sterile fashion. The patient was positioned in the supine position.  Local anesthetic agent was injected into the skin above the umbilicus and an incision made. We dissected down to the abdominal fascia with blunt dissection.  The fascia was incised vertically and we entered the peritoneal cavity bluntly.  A pursestring suture of 0-Vicryl was placed around the fascial opening.  The Hasson cannula was inserted and secured with the stay suture.  Pneumoperitoneum was then created with CO2 and tolerated well without any adverse changes in the patient's vital signs. An 11-mm port was placed in the subxiphoid position.  Two 5-mm ports were placed in the right upper quadrant. All skin incisions were infiltrated with a local anesthetic agent before making the incision and placing the trocars.   We positioned the patient in reverse  Trendelenburg, tilted  slightly to the patient's left.   The laparoscope was inserted.  Visual inspection of the liver shows an unusually yellowish liver that appears to be more swollen than normal.  The gallbladder is barely visible in an intrahepatic location.  There are omental adhesions to the gallbladder.  The gallbladder was identified, the fundus grasped and retracted cephalad. Adhesions were lysed bluntly and with the electrocautery where indicated, taking care not to injure any adjacent organs or viscus.  We had significant difficulty visualizing the hilum of the gallbladder due to the enlarged left lobe of the liver and intrahepatic location.  We placed an additional port in the midline to help with retraction.  I asked my partner Dr. Fredricka Bonine to assist on the case.  We scored the peritoneum over the hilum of the gallbladder with cautery.  We bluntly dissected around what appeared to be the cystic artery.  We ligated this with clips and divided it.  We then attempted to dissect around the cystic duct.  This was quite difficult due to the visualization and intrahepatic location.  We encountered a bleeding vein posteriorly.  We held direct pressure over this area.  Due to the difficulty in visualization, we decided to come from a "dome down" approach.  Cautery was used to dissect the gallbladder away from the liver.  Once we had reached the area at the hilum of the gallbladder above what appeared to be the cystic duct we ligated this with 2 Endoloops.  The gallbladder was then divided.  A single stone was spilled and retrieved.  Some bile was also spilled and was suctioned out.  Throughout the case, as we attempted to retract the enlarged lobes of both sides of the liver, multiple areas where we touched the liver with instruments showed a very friable hepatic capsule.  There were several small punctures into the hepatic parenchyma.  These were controlled with cautery.  The gallbladder was removed and placed  in an Endocatch sac. The liver bed was irrigated and inspected.  The bleeding area posterior to the gallbladder was controlled with Surgiflo.  We waited several minutes before inspected this area again.  Hemostasis seems to be adequate at this point.  We placed a 71 French drain into the abdomen exiting the most lateral port in the right upper quadrant.  This was secured to the skin with 2-0 nylon.  The drain was placed in the gallbladder fossa.  Copious irrigation was utilized and was repeatedly aspirated until clear.  The gallbladder and Endocatch sac were then removed through the umbilical port site.  The pursestring suture was used to close the umbilical fascia.  An additional 0 Vicryl was used to complete the fascial closure.  We again inspected the right upper quadrant for hemostasis.  Pneumoperitoneum was released as we removed the trocars.  4-0 Monocryl was used to close the skin.   Benzoin, steri-strips, and clean dressings were applied. The patient was then extubated and brought to the recovery room in stable condition. Instrument, sponge, and needle counts were correct at closure and at the conclusion of the case.   Findings: Cholecystitis with Cholelithiasis; severe fatty liver with hepatomegaly  Estimated Blood Loss: 200 mL         Drains: 19 Fr JP drain         Specimens: Gallbladder           Complications: None; patient tolerated the procedure well.         Disposition: PACU - hemodynamically stable.  Condition: stable  Wilmon Arms. Corliss Skains, MD, [facility]  General Surgery   04/26/2023 2:11 PM

## 2023-04-26 NOTE — Anesthesia Postprocedure Evaluation (Signed)
 Anesthesia Post Note  Patient: John Andrade  Procedure(s) Performed: LAPAROSCOPIC CHOLECYSTECTOMY     Patient location during evaluation: PACU Anesthesia Type: General Level of consciousness: awake and alert and oriented Pain management: pain level controlled Vital Signs Assessment: post-procedure vital signs reviewed and stable Respiratory status: spontaneous breathing, nonlabored ventilation and respiratory function stable Cardiovascular status: blood pressure returned to baseline and stable Postop Assessment: no apparent nausea or vomiting Anesthetic complications: no   No notable events documented.  Last Vitals:  Vitals:   04/26/23 1530 04/26/23 1545  BP: 116/79 125/83  Pulse: 82 69  Resp: 12 10  Temp:    SpO2: 94% 97%    Last Pain:  Vitals:   04/26/23 1515  TempSrc:   PainSc: Asleep                 Momina Hunton A.

## 2023-04-26 NOTE — Plan of Care (Signed)
  Problem: Safety: Goal: Ability to remain free from injury will improve Outcome: Progressing   Problem: Pain Managment: Goal: General experience of comfort will improve and/or be controlled Outcome: Progressing

## 2023-04-26 NOTE — Interval H&P Note (Signed)
 History and Physical Interval Note:  04/26/2023 10:40 AM  John Andrade  has presented today for surgery, with the diagnosis of CHRONIC CALCULUS CHOLECYSTITIS.  The various methods of treatment have been discussed with the patient and family. After consideration of risks, benefits and other options for treatment, the patient has consented to  Procedure(s): LAPAROSCOPIC CHOLECYSTECTOMY WITH INTRAOPERATIVE CHOLANGIOGRAM (N/A) as a surgical intervention.  The patient's history has been reviewed, patient examined, no change in status, stable for surgery.  I have reviewed the patient's chart and labs.  Questions were answered to the patient's satisfaction.     [name]

## 2023-04-27 ENCOUNTER — Encounter (HOSPITAL_COMMUNITY): Payer: Self-pay | Admitting: Surgery

## 2023-04-27 DIAGNOSIS — K8012 Calculus of gallbladder with acute and chronic cholecystitis without obstruction: Secondary | ICD-10-CM | POA: Diagnosis not present

## 2023-04-27 LAB — COMPREHENSIVE METABOLIC PANEL
ALT: 143 U/L — ABNORMAL HIGH (ref 0–44)
AST: 127 U/L — ABNORMAL HIGH (ref 15–41)
Albumin: 3.7 g/dL (ref 3.5–5.0)
Alkaline Phosphatase: 46 U/L (ref 38–126)
Anion gap: 14 (ref 5–15)
BUN: 20 mg/dL (ref 8–23)
CO2: 21 mmol/L — ABNORMAL LOW (ref 22–32)
Calcium: 8.9 mg/dL (ref 8.9–10.3)
Chloride: 101 mmol/L (ref 98–111)
Creatinine, Ser: 1.16 mg/dL (ref 0.61–1.24)
GFR, Estimated: >60 mL/min (ref >60)
Glucose, Bld: 241 mg/dL — ABNORMAL HIGH (ref 70–99)
Potassium: 3.9 mmol/L (ref 3.5–5.1)
Sodium: 136 mmol/L (ref 135–145)
Total Bilirubin: 0.8 mg/dL (ref 0.0–1.2)
Total Protein: 7 g/dL (ref 6.5–8.1)

## 2023-04-27 LAB — CBC
HCT: 39.5 % (ref 39.0–52.0)
Hemoglobin: 12.8 g/dL — ABNORMAL LOW (ref 13.0–17.0)
MCH: 28.5 pg (ref 26.0–34.0)
MCHC: 32.4 g/dL (ref 30.0–36.0)
MCV: 88 fL (ref 80.0–100.0)
Platelets: 272 10*3/uL (ref 150–400)
RBC: 4.49 MIL/uL (ref 4.22–5.81)
RDW: 12.9 % (ref 11.5–15.5)
WBC: 21.1 10*3/uL — ABNORMAL HIGH (ref 4.0–10.5)
nRBC: 0 % (ref 0.0–0.2)

## 2023-04-27 LAB — SURGICAL PATHOLOGY

## 2023-04-27 MED ORDER — OXYCODONE HCL 5 MG PO TABS
5.0000 mg | ORAL_TABLET | Freq: Four times a day (QID) | ORAL | 0 refills | Status: DC | PRN
Start: 2023-04-27 — End: 2023-08-21

## 2023-04-27 NOTE — Discharge Summary (Signed)
 Physician Discharge Summary  Patient ID: John Andrade MRN: [id number] DOB/AGE: 1955-06-06 68 y.o.  Admit date: 04/26/2023 Discharge date: 04/27/2023  Admission Diagnoses:  Chronic calculus cholecystitis  Discharge Diagnoses: Chronic calculus cholecystitis    Hepatic steatosis Principal Problem:   Chronic cholecystitis with calculus   Discharged Condition: good  Hospital Course:  The patient underwent laparoscopic cholecystectomy 04/26/23.  Surgery was very difficult due to the hepatomegaly, which appeared to be due to fatty deposits in the liver.  The gallbladder was in an intrahepatic location.  There was some bleeding during surgery, so we left a drain in place and kept him overnight. His hemoglobin is stable and his vitals are normal.  Tolerating diet without nausea.  Ready for discharge this morning.   Treatments: surgery: laparoscopic cholecystectomy 04/26/23  Discharge Exam: Blood pressure 134/81, pulse 78, temperature 98 F (36.7 C), temperature source Oral, resp. rate 18, height 5\' 6"  (1.676 m), weight 113.1 kg, SpO2 96%. No jaundice Obese, soft, incisional tenderness Drain - serosanguinous Incisions c/d/I  Disposition: Discharge disposition: 01-Home or Self Care       Discharge Instructions     Call MD for:  persistant nausea and vomiting   Complete by: As directed    Call MD for:  redness, tenderness, or signs of infection (pain, swelling, redness, odor or green/yellow discharge around incision site)   Complete by: As directed    Call MD for:  severe uncontrolled pain   Complete by: As directed    Call MD for:  temperature >100.4   Complete by: As directed    Diet general   Complete by: As directed    Driving Restrictions   Complete by: As directed    Do not drive while taking pain medications   Increase activity slowly   Complete by: As directed    May shower / Bathe   Complete by: As directed       Allergies as of 04/27/2023       Reactions   Statins  Other (See Comments)   Flu-like symptoms   Tylenol [acetaminophen]    Liver enzymes elevated when taking too much tylenol    Phentermine Other (See Comments)   constipation        Medication List     TAKE these medications    amLODipine 10 MG tablet Commonly known as: NORVASC TAKE 1 TABLET BY MOUTH EVERY MORNING FOR BLOOD PRESSURE   ezetimibe 10 MG tablet Commonly known as: Zetia Take 1 tablet (10 mg total) by mouth daily. What changed: when to take this   hydrochlorothiazide 25 MG tablet Commonly known as: HYDRODIURIL TAKE 1 TABLET BY MOUTH EVERY MORNING FOR FOR BLOOD PRESSURE AND FLUID RETENTION/ANKLE SWELLING   L-Lysine 500 MG Tabs Take 500 mg by mouth daily.   loratadine 10 MG tablet Commonly known as: CLARITIN Take 10 mg by mouth daily.   minoxidil 10 MG tablet Commonly known as: LONITEN Take  1 tablet  at Night  for  BP   montelukast 10 MG tablet Commonly known as: SINGULAIR Take  1 tablet  Daily for Allergies                                /  TAKE                                         BY                                                 MOUTH   olmesartan 40 MG tablet Commonly known as: BENICAR TAKE 1 TABLET BY MOUTH DAILY FOR BLOOD PRESSURE What changed:  how much to take how to take this when to take this additional instructions   omeprazole 40 MG capsule Commonly known as: PRILOSEC Take  1 capsule  2 x /day  to Prevent Heartburn & Indigestion                                                  /                                                                   TAKE                                         BY                                                 MOUTH   ondansetron 4 MG disintegrating tablet Commonly known as: ZOFRAN-ODT Take 1 tablet (4 mg total) by mouth every 8 (eight) hours as needed for nausea or vomiting.   oxyCODONE 5 MG immediate release tablet Commonly known as: Oxy  IR/ROXICODONE Take 1 tablet (5 mg total) by mouth every 6 (six) hours as needed for moderate pain (pain score 4-6). What changed: reasons to take this   Vitamin D3 125 MCG (5000 UT) Caps Take 2 capsules ( 10,000 units)  Daily What changed:  how much to take how to take this when to take this additional instructions        Follow-up Information     Manus Rudd, MD Follow up in 4 day(s).   Specialty: General Surgery Contact information: [address] [place] [address] [phone]                 Signed: [name] 04/27/2023, 11:34 AM

## 2023-04-27 NOTE — Discharge Instructions (Signed)
 [facility] LAPAROSCOPIC SURGERY: POST OP INSTRUCTIONS Always review your discharge instruction sheet given to you by the facility where your surgery was performed. IF YOU HAVE DISABILITY OR FAMILY LEAVE FORMS, YOU MUST BRING THEM TO THE OFFICE FOR PROCESSING.   DO NOT GIVE THEM TO YOUR DOCTOR.  A prescription for pain medication will be given to you upon discharge.  Take your pain medication as prescribed, if needed.  If narcotic pain medicine is not needed, then you may take acetaminophen (Tylenol) or ibuprofen (Advil) as needed. Take your usually prescribed medications unless otherwise directed. If you need a refill on your pain medication, please contact your pharmacy.  They will contact our office to request authorization. Prescriptions will not be filled after 5pm or on week-ends. You should follow a light diet the first few days after arrival home, such as soup and crackers, etc.  Be sure to include lots of fluids daily. Most patients will experience some swelling and bruising in the area of the incisions.  Ice packs will help.  Swelling and bruising can take several days to resolve.  It is common to experience some constipation if taking pain medication after surgery.  Increasing fluid intake and taking a stool softener (such as Colace) will usually help or prevent this problem from occurring.  A mild laxative (Milk of Magnesia or Miralax) should be taken according to package instructions if there are no bowel movements after 48 hours. Sponge baths only while the drain is in place.   ACTIVITIES:  You may resume regular (light) daily activities beginning the next day--such as daily self-care, walking, climbing stairs--gradually increasing activities as tolerated.  You may have sexual intercourse when it is comfortable.  Refrain from any heavy lifting or straining until approved by your doctor. You may drive when you are no longer taking prescription pain medication, you can  comfortably wear a seatbelt, and you can safely maneuver your car and apply brakes. RETURN TO WORK:   2-3 weeks You should see your doctor in the office for a follow-up appointment approximately 2-3 weeks after your surgery.  Make sure that you call for this appointment within a day or two after you arrive home to insure a convenient appointment time. OTHER INSTRUCTIONS: ________________________________________________________________________ WHEN TO CALL YOUR DOCTOR: Fever over 101.0 Inability to urinate Continued bleeding from incision. Increased pain, redness, or drainage from the incision. Increasing abdominal pain  The clinic staff is available to answer your questions during regular business hours.  Please don't hesitate to call and ask to speak to one of the nurses for clinical concerns.  If you have a medical emergency, go to the nearest emergency room or call 911.  A surgeon from [facility] is always on call at the hospital. [address], [place], [place]  [address] ? [address] [address], [place], [place]   [address] [phone] ? [phone] ? FAX [phone] Web site: [email or web address]

## 2023-04-27 NOTE — Plan of Care (Signed)

## 2023-04-27 NOTE — Progress Notes (Signed)
 Assessment unchanged. Pt and wife verbalized understanding of dc instructions including medications to resume, JP drain care (how to empty and charge and record findings) and follow up care. Discharged via wc to front entrance accompanied by NT.

## 2023-05-01 DIAGNOSIS — K760 Fatty (change of) liver, not elsewhere classified: Secondary | ICD-10-CM | POA: Insufficient documentation

## 2023-05-01 DIAGNOSIS — K8010 Calculus of gallbladder with chronic cholecystitis without obstruction: Secondary | ICD-10-CM | POA: Insufficient documentation

## 2023-05-02 ENCOUNTER — Other Ambulatory Visit: Payer: Self-pay

## 2023-05-02 DIAGNOSIS — J3089 Other allergic rhinitis: Secondary | ICD-10-CM

## 2023-05-02 MED ORDER — MONTELUKAST SODIUM 10 MG PO TABS: ORAL_TABLET | ORAL | 0 refills | Status: DC

## 2023-05-02 MED ORDER — OMEPRAZOLE 40 MG PO CPDR: DELAYED_RELEASE_CAPSULE | ORAL | 0 refills | Status: DC

## 2023-05-16 ENCOUNTER — Other Ambulatory Visit: Payer: Self-pay | Admitting: General Practice

## 2023-05-16 DIAGNOSIS — I10 Essential (primary) hypertension: Secondary | ICD-10-CM

## 2023-05-16 NOTE — Telephone Encounter (Signed)
 Copied from CRM [id number]. Topic: Clinical - Medication Refill >> May 16, 2023 11:22 AM Marica Otter wrote: Most Recent Primary Care Visit:   Medication: amLODipine (NORVASC) 10 MG tablet   Has the patient contacted their pharmacy? Yes, no refills can't contact office former Dr. Danella Sensing patient (Agent: If no, request that the patient contact the pharmacy for the refill. If patient does not wish to contact the pharmacy document the reason why and proceed with request.) (Agent: If yes, when and what did the pharmacy advise?)  Is this the correct pharmacy for this prescription? Yes If no, delete pharmacy and type the correct one.  This is the patient's preferred pharmacy:  [facility] [id number] [place], [place] - [address] [place] [address] Phone: [phone] Fax: [phone]     Has the prescription been filled recently? No  Is the patient out of the medication? Yes  Has the patient been seen for an appointment in the last year OR does the patient have an upcoming appointment? Yes  Can we respond through MyChart? Yes  Agent: Please be advised that Rx refills may take up to 3 business days. We ask that you follow-up with your pharmacy.

## 2023-05-17 NOTE — Telephone Encounter (Signed)
 Called and spoke with [facility] CAL staff, okay to route to clinic.

## 2023-05-18 NOTE — Telephone Encounter (Signed)
 LMTCB; need to see if patient can come in 3/27 at 11:40 or 3/31 at 10am for NP appt to refill medication.

## 2023-05-19 NOTE — Telephone Encounter (Signed)
 FYI patient moved appt to 05/22/23 at 10

## 2023-05-22 ENCOUNTER — Encounter: Payer: Self-pay | Admitting: General Practice

## 2023-05-22 ENCOUNTER — Ambulatory Visit (INDEPENDENT_AMBULATORY_CARE_PROVIDER_SITE_OTHER): Admitting: General Practice

## 2023-05-22 VITALS — BP 120/68 | HR 72 | Temp 98.9°F | Ht 64.25 in | Wt 247.0 lb

## 2023-05-22 DIAGNOSIS — E785 Hyperlipidemia, unspecified: Secondary | ICD-10-CM

## 2023-05-22 DIAGNOSIS — E1169 Type 2 diabetes mellitus with other specified complication: Secondary | ICD-10-CM

## 2023-05-22 DIAGNOSIS — Z7985 Long-term (current) use of injectable non-insulin antidiabetic drugs: Secondary | ICD-10-CM

## 2023-05-22 DIAGNOSIS — Z23 Encounter for immunization: Secondary | ICD-10-CM | POA: Diagnosis not present

## 2023-05-22 DIAGNOSIS — I10 Essential (primary) hypertension: Secondary | ICD-10-CM

## 2023-05-22 DIAGNOSIS — E1122 Type 2 diabetes mellitus with diabetic chronic kidney disease: Secondary | ICD-10-CM | POA: Diagnosis not present

## 2023-05-22 DIAGNOSIS — N182 Chronic kidney disease, stage 2 (mild): Secondary | ICD-10-CM

## 2023-05-22 DIAGNOSIS — Z7689 Persons encountering health services in other specified circumstances: Secondary | ICD-10-CM | POA: Diagnosis not present

## 2023-05-22 DIAGNOSIS — I700 Atherosclerosis of aorta: Secondary | ICD-10-CM

## 2023-05-22 DIAGNOSIS — K219 Gastro-esophageal reflux disease without esophagitis: Secondary | ICD-10-CM

## 2023-05-22 DIAGNOSIS — Z6841 Body Mass Index (BMI) 40.0 and over, adult: Secondary | ICD-10-CM

## 2023-05-22 LAB — COMPREHENSIVE METABOLIC PANEL WITH GFR
ALT: 17 U/L (ref 0–53)
AST: 15 U/L (ref 0–37)
Albumin: 4 g/dL (ref 3.5–5.2)
Alkaline Phosphatase: 95 U/L (ref 39–117)
BUN: 21 mg/dL (ref 6–23)
CO2: 29 meq/L (ref 19–32)
Calcium: 9.5 mg/dL (ref 8.4–10.5)
Chloride: 103 meq/L (ref 96–112)
Creatinine, Ser: 1.07 mg/dL (ref 0.40–1.50)
GFR: 72.52 mL/min (ref >60.00)
Glucose, Bld: 150 mg/dL — ABNORMAL HIGH (ref 70–99)
Potassium: 3.9 meq/L (ref 3.5–5.1)
Sodium: 141 meq/L (ref 135–145)
Total Bilirubin: 0.3 mg/dL (ref 0.2–1.2)
Total Protein: 7.7 g/dL (ref 6.0–8.3)

## 2023-05-22 LAB — CBC
HCT: 37.5 % — ABNORMAL LOW (ref 39.0–52.0)
Hemoglobin: 12.5 g/dL — ABNORMAL LOW (ref 13.0–17.0)
MCHC: 33.4 g/dL (ref 30.0–36.0)
MCV: 84.2 fl (ref 78.0–100.0)
Platelets: 390 10*3/uL (ref 150.0–400.0)
RBC: 4.45 Mil/uL (ref 4.22–5.81)
RDW: 13 % (ref 11.5–15.5)
WBC: 8 10*3/uL (ref 4.0–10.5)

## 2023-05-22 MED ORDER — AMLODIPINE BESYLATE 10 MG PO TABS: ORAL_TABLET | ORAL | 1 refills | Status: DC

## 2023-05-22 MED ORDER — BLOOD GLUCOSE MONITORING SUPPL DEVI: 1.0000 | Freq: Three times a day (TID) | 0 refills | Status: AC

## 2023-05-22 MED ORDER — LANCETS MISC. MISC: 1.0000 | Freq: Three times a day (TID) | 0 refills | Status: AC

## 2023-05-22 MED ORDER — LANCET DEVICE MISC: 1.0000 | Freq: Three times a day (TID) | 0 refills | Status: AC

## 2023-05-22 MED ORDER — BLOOD GLUCOSE TEST VI STRP: 1.0000 | ORAL_STRIP | Freq: Three times a day (TID) | 0 refills | Status: AC

## 2023-05-22 NOTE — Assessment & Plan Note (Signed)
 Discussed the importance of monitoring diet and avoiding fried, greasy foods.

## 2023-05-22 NOTE — Assessment & Plan Note (Signed)
 Discussed the importance of medication adherence.   Resume Zetia 10 mg once daily.  Will check lipid panel in three months.

## 2023-05-22 NOTE — Assessment & Plan Note (Signed)
 Controlled.   Continue omeprazole 40 mg once daily.

## 2023-05-22 NOTE — Progress Notes (Signed)
 New Patient Office Visit  Subjective    Patient ID: John Andrade, male    DOB: 01/06/1956  Age: 68 y.o. MRN: [id number]  CC:  Chief Complaint  Patient presents with   New Patient (Initial Visit)   Hypertension    Needs amlodipine refilled    HPI John Andrade is a 68 y.o. male presents to establish care.   Previous PCP/physical/labs: [place]/01/30/23 was last physical and labs.   HTN: diagnosed several years ago. Amlodipine 10 mg once daily, Minoxidil 10 mg once daily, olmesartan 40 mg once daily and hydrochlorothiazide 25 mg daily. No concerns today.   GERD/s/p cholecystectomy/hepatic steatosis: surgery was three weeks. Has been doing well. Still having some diarrhea. Trying to low fat diet and avoiding fried, greasy foods. Denies any abdominal pain, nausea or vomiting. Currently takes omeprazole 40 mg once daily for GERD. No concerns.  Type 2 DM-  his last Hemoglobin A1c- 7.0 . Has tried metformin and ozempic. Stopped it due to the side effects. He did have a lot of diarrhea. He was on Metformin 2500 mg once daily. He stopped ozempic in February. He is scared to resume Ozempic due to the recent gallbladder concerns. He suspects that the gallbladder concerns started Ozempic. He has been monitoring his diet and will start exercise.   HLD: has been of zetia since February. Denies any chest pain.   Outpatient Encounter Medications as of 05/22/2023  Medication Sig   Blood Glucose Monitoring Suppl DEVI 1 each by Does not apply route in the morning, at noon, and at bedtime. May substitute to any manufacturer covered by patient's insurance.   Cholecalciferol (VITAMIN D3) 125 MCG (5000 UT) capsule Take 2 capsules ( 10,000 units)  Daily (Patient taking differently: Take 5,000 Units by mouth daily.)   Glucose Blood (BLOOD GLUCOSE TEST STRIPS) STRP 1 each by In Vitro route in the morning, at noon, and at bedtime. May substitute to any manufacturer covered by patient's insurance.    hydrochlorothiazide (HYDRODIURIL) 25 MG tablet TAKE 1 TABLET BY MOUTH EVERY MORNING FOR FOR BLOOD PRESSURE AND FLUID RETENTION/ANKLE SWELLING   L-Lysine 500 MG TABS Take 500 mg by mouth daily.    Lancet Device MISC 1 each by Does not apply route in the morning, at noon, and at bedtime. May substitute to any manufacturer covered by patient's insurance.   Lancets Misc. MISC 1 each by Does not apply route in the morning, at noon, and at bedtime. May substitute to any manufacturer covered by patient's insurance.   loratadine (CLARITIN) 10 MG tablet Take 10 mg by mouth daily.   minoxidil (LONITEN) 10 MG tablet Take  1 tablet  at Night  for  BP   montelukast (SINGULAIR) 10 MG tablet Take  1 tablet  Daily for Allergies                                /                                                                   TAKE  BY                                                 MOUTH   olmesartan (BENICAR) 40 MG tablet TAKE 1 TABLET BY MOUTH DAILY FOR BLOOD PRESSURE (Patient taking differently: Take 40 mg by mouth at bedtime.)   omeprazole (PRILOSEC) 40 MG capsule Take  1 capsule  2 x /day  to Prevent Heartburn & Indigestion                                                  /                                                                   TAKE                                         BY                                                 MOUTH   ondansetron (ZOFRAN-ODT) 4 MG disintegrating tablet Take 1 tablet (4 mg total) by mouth every 8 (eight) hours as needed for nausea or vomiting.   oxyCODONE (OXY IR/ROXICODONE) 5 MG immediate release tablet Take 1 tablet (5 mg total) by mouth every 6 (six) hours as needed for moderate pain (pain score 4-6).   [DISCONTINUED] amLODipine (NORVASC) 10 MG tablet TAKE 1 TABLET BY MOUTH EVERY MORNING FOR BLOOD PRESSURE   amLODipine (NORVASC) 10 MG tablet TAKE 1 TABLET BY MOUTH EVERY MORNING FOR BLOOD PRESSURE   ezetimibe (ZETIA) 10 MG tablet Take 1  tablet (10 mg total) by mouth daily. (Patient not taking: Reported on 05/22/2023)   No facility-administered encounter medications on file as of 05/22/2023.    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    GERD (gastroesophageal reflux disease)    takes Omeprazole daily   History of kidney stones    Hyperlipidemia    Hypertension    takes Lisinopril and Amlodipine daily   Multiple allergies    takes Claritin and Singulair daily   PONV (postoperative nausea and vomiting)    with his longer surgeries   Vertigo 10/15/2019   peripheral vertigo last episode was in 2019    Past Surgical History:  Procedure Laterality Date   ANKLE FRACTURE SURGERY  1995   right   BACK SURGERY  2002   lumb lam-fusion   BACK SURGERY  03-2014   Dr Channing Mutters in [place]   CARDIAC CATHETERIZATION     2007  CARPAL TUNNEL RELEASE Right 09/24/2019   Procedure: RIGHT CARPAL TUNNEL RELEASE;  Surgeon: Marcene Corning, MD;  Location: [facility];  Service: Orthopedics;  Laterality: Right;   CARPAL TUNNEL RELEASE Left 10/22/2019   Procedure: LEFT CARPAL TUNNEL RELEASE;  Surgeon: Marcene Corning, MD;  Location: [facility];  Service: Orthopedics;  Laterality: Left;   CHOLECYSTECTOMY N/A 04/26/2023   Procedure: LAPAROSCOPIC CHOLECYSTECTOMY;  Surgeon: Manus Rudd, MD;  Location: [facility];  Service: General;  Laterality: N/A;   CHONDROPLASTY Left 10/28/2014   Procedure: CHONDROPLASTY;  Surgeon: Marcene Corning, MD;  Location: [facility];  Service: Orthopedics;  Laterality: Left;   COLONOSCOPY     ELBOW LIGAMENT RECONSTRUCTION Right 2015   [place]   KNEE ARTHROSCOPY Left 04/24/2012   Procedure: ARTHROSCOPY KNEE, PARTIAL MEDIAL MENISECTOMY AND CHONDROPLASTY;  Surgeon: [name], MD;  Location: [facility];  Service: Orthopedics;  Laterality: Left;   KNEE  ARTHROSCOPY WITH MEDIAL MENISECTOMY Left 10/28/2014   Procedure: LEFT ARTHROSCOPY KNEE, PARTIAL MEDIAL MENISECTOMY, CHONDROPLASTY;  Surgeon: Marcene Corning, MD;  Location: [facility];  Service: Orthopedics;  Laterality: Left;   SHOULDER ARTHROSCOPY WITH BICEPSTENOTOMY Right 04/13/2020   Procedure: SHOULDER ARTHROSCOPY WITH BICEPSTENOTOMY;  Surgeon: Marcene Corning, MD;  Location: [facility];  Service: Orthopedics;  Laterality: Right;   TOTAL HIP ARTHROPLASTY Right 09/22/2015   Procedure: TOTAL HIP ARTHROPLASTY ANTERIOR APPROACH;  Surgeon: Marcene Corning, MD;  Location: [facility];  Service: Orthopedics;  Laterality: Right;   TOTAL HIP ARTHROPLASTY Left 01/05/2016   Procedure: LEFT TOTAL HIP ARTHROPLASTY ANTERIOR APPROACH;  Surgeon: Marcene Corning, MD;  Location: [facility];  Service: Orthopedics;  Laterality: Left;   TRIGGER FINGER RELEASE Right 09/24/2019   Procedure: RIGHT RELEASE TRIGGER FINGER/A-1 PULLEY;  Surgeon: Marcene Corning, MD;  Location: [facility];  Service: Orthopedics;  Laterality: Right;   WISDOM TOOTH EXTRACTION      Family History  Problem Relation Age of Onset   Hypertension Mother    Dementia Mother    Cancer Father    Hypertension Father    Heart disease Father    Hyperlipidemia Father    Diabetes Sister    Cancer Maternal Grandmother        lung    Social History   Socioeconomic History   Marital status: Married    Spouse name: Not on file   Number of children: Not on file   Years of education: Not on file   Highest education level: Doctorate  Occupational History   Not on file  Tobacco Use   Smoking status: Former    Current packs/day: 0.00    Types: Cigarettes    Quit date: 02/22/1975    Years since quitting: 48.2   Smokeless tobacco: Never  Vaping Use   Vaping status: Never Used  Substance and Sexual Activity   Alcohol use: No   Drug use: No   Sexual activity: Not Currently  Other Topics Concern   Not on file  Social History  Narrative   Not on file   Social Drivers of Health   Financial Resource Strain: Low Risk  (05/18/2023)   Overall Financial Resource Strain (CARDIA)    Difficulty of Paying Living Expenses: Not very hard  Food Insecurity: No Food Insecurity (05/18/2023)   Hunger Vital Sign    Worried About Running Out of Food in the Last Year: Never true    Ran Out of Food in the Last Year: Never true  Transportation Needs: No Transportation Needs (  05/18/2023)   PRAPARE - Administrator, Civil Service (Medical): No    Lack of Transportation (Non-Medical): No  Physical Activity: Unknown (05/18/2023)   Exercise Vital Sign    Days of Exercise per Week: Patient declined    Minutes of Exercise per Session: Not on file  Stress: No Stress Concern Present (05/18/2023)   [employer] of Occupational Health - Occupational Stress Questionnaire    Feeling of Stress : Not at all  Social Connections: Unknown (05/18/2023)   Social Connection and Isolation Panel [NHANES]    Frequency of Communication with Friends and Family: Patient declined    Frequency of Social Gatherings with Friends and Family: Patient declined    Attends Religious Services: More than 4 times per year    Active Member of [employer] or Organizations: Yes    Attends Engineer, structural: More than 4 times per year    Marital Status: Married  Recent Concern: Social Connections - Socially Isolated (04/26/2023)   Social Connection and Isolation Panel [NHANES]    Frequency of Communication with Friends and Family: Never    Frequency of Social Gatherings with Friends and Family: Never    Attends Religious Services: Never    Database administrator or Organizations: No    Attends Banker Meetings: Never    Marital Status: Married  Catering manager Violence: Not At Risk (04/26/2023)   Humiliation, Afraid, Rape, and Kick questionnaire    Fear of Current or Ex-Partner: No    Emotionally Abused: No    Physically Abused: No     Sexually Abused: No    Review of Systems  Constitutional:  Negative for chills and fever.  Respiratory:  Negative for shortness of breath.   Cardiovascular:  Negative for chest pain.  Gastrointestinal:  Negative for abdominal pain, constipation, diarrhea, heartburn, nausea and vomiting.  Genitourinary:  Negative for dysuria, frequency and urgency.  Neurological:  Negative for dizziness and headaches.  Endo/Heme/Allergies:  Negative for polydipsia.  Psychiatric/Behavioral:  Negative for depression and suicidal ideas. The patient is not nervous/anxious.         Objective    BP 120/68 (BP Location: Left Arm, Patient Position: Sitting, Cuff Size: Large)   Pulse 72   Temp 98.9 F (37.2 C) (Oral)   Ht 5' 4.25" (1.632 m)   Wt 247 lb (112 kg)   SpO2 98%   BMI 42.07 kg/m   Physical Exam Vitals and nursing note reviewed.  Constitutional:      Appearance: Normal appearance.  Cardiovascular:     Rate and Rhythm: Normal rate and regular rhythm.     Pulses: Normal pulses.     Heart sounds: Normal heart sounds.  Pulmonary:     Effort: Pulmonary effort is normal.     Breath sounds: Normal breath sounds.  Neurological:     Mental Status: He is alert and oriented to person, place, and time.  Psychiatric:        Mood and Affect: Mood normal.        Behavior: Behavior normal.        Thought Content: Thought content normal.        Judgment: Judgment normal.         Assessment & Plan:  Primary hypertension Assessment & Plan: BP at goal today.   Resume Amlodipine 10 mg once daily.   Continue minoxidil 10 mg once daily, Olmesartan 40 mg once daily and hydrochlorothiazide 25 mg once daily.   CMP  pending.   Follow up in 3 months.  Orders: -     CBC -     Comprehensive metabolic panel with GFR  Establishing care with new doctor, encounter for Assessment & Plan: EMR reviewed briefly.   Type 2 diabetes mellitus with stage 2 chronic kidney disease, without long-term  current use of insulin (HCC) Assessment & Plan: Uncontrolled.   Discussed the importance of medication adherence.   At this time, he does not want to resume ozempic or metformin.   Rx sent for glucometer and he will bring it back in three months.   Discussed the importance of monitoring diet.   Follow up in 3 months for foot exam, hemoglobin A1c check.   Orders: -     Blood Glucose Monitoring Suppl; 1 each by Does not apply route in the morning, at noon, and at bedtime. May substitute to any manufacturer covered by patient's insurance.  Dispense: 1 each; Refill: 0 -     Blood Glucose Test; 1 each by In Vitro route in the morning, at noon, and at bedtime. May substitute to any manufacturer covered by patient's insurance.  Dispense: 90 each; Refill: 0 -     Lancet Device; 1 each by Does not apply route in the morning, at noon, and at bedtime. May substitute to any manufacturer covered by patient's insurance.  Dispense: 1 each; Refill: 0 -     Lancets Misc.; 1 each by Does not apply route in the morning, at noon, and at bedtime. May substitute to any manufacturer covered by patient's insurance.  Dispense: 100 each; Refill: 0  Essential hypertension Assessment & Plan: BP at goal today.   Resume Amlodipine 10 mg once daily.   Continue minoxidil 10 mg once daily, Olmesartan 40 mg once daily and hydrochlorothiazide 25 mg once daily.   CMP pending.   Follow up in 3 months.  Orders: -     amLODIPine Besylate; TAKE 1 TABLET BY MOUTH EVERY MORNING FOR BLOOD PRESSURE  Dispense: 90 tablet; Refill: 1  Morbidly obese (HCC) Assessment & Plan: Discussed the importance of monitoring diet and exercise.   Aortic atherosclerosis (HCC) Assessment & Plan: Discussed the importance of mediation adherence.   Restart Zetia 10 mg once daily.    CKD stage 2 due to type 2 diabetes mellitus (HCC) Assessment & Plan: Repeat CMP pending.   Gastroesophageal reflux disease, unspecified whether  esophagitis present Assessment & Plan: Controlled.   Continue omeprazole 40 mg once daily.    Need for pneumococcal 20-valent conjugate vaccination -     Pneumococcal conjugate vaccine 20-valent  Hyperlipidemia associated with type 2 diabetes mellitus (HCC) Assessment & Plan: Discussed the importance of medication adherence.   Resume Zetia 10 mg once daily.  Will check lipid panel in three months.      Return in about 3 months (around 08/21/2023) for labs and dm.   [place] Charon, NP

## 2023-05-22 NOTE — Assessment & Plan Note (Signed)
 BP at goal today.   Resume Amlodipine 10 mg once daily.   Continue minoxidil 10 mg once daily, Olmesartan 40 mg once daily and hydrochlorothiazide 25 mg once daily.   CMP pending.   Follow up in 3 months.

## 2023-05-22 NOTE — Assessment & Plan Note (Signed)
 Uncontrolled.   Discussed the importance of medication adherence.   At this time, he does not want to resume ozempic or metformin.   Rx sent for glucometer and he will bring it back in three months.   Discussed the importance of monitoring diet.   Follow up in 3 months for foot exam, hemoglobin A1c check.

## 2023-05-22 NOTE — Assessment & Plan Note (Signed)
 EMR reviewed briefly.

## 2023-05-22 NOTE — Assessment & Plan Note (Signed)
 Discussed the importance of monitoring diet and exercise.

## 2023-05-22 NOTE — Assessment & Plan Note (Signed)
 Discussed the importance of mediation adherence.   Restart Zetia 10 mg once daily.

## 2023-05-22 NOTE — Assessment & Plan Note (Signed)
Repeat CMP pending. 

## 2023-05-22 NOTE — Patient Instructions (Addendum)
 Stop by the lab prior to leaving today. I will notify you of your results once received.   Schedule tetanus and shingles vaccine at the pharmacy.   Restart Zetia and blood pressure medication.   Start checking your blood sugar levels.  Appropriate times to check your blood sugar levels are:  -Before any meal (breakfast, lunch, dinner) -Two hours after any meal (breakfast, lunch, dinner) -Bedtime  Record your readings and notify me if you continue to consistently run at or above 150.   Follow up in 3 months.  It was a pleasure meeting you!

## 2023-06-26 ENCOUNTER — Ambulatory Visit: Admitting: General Practice

## 2023-07-27 ENCOUNTER — Other Ambulatory Visit: Payer: Self-pay | Admitting: Family

## 2023-07-27 DIAGNOSIS — J3089 Other allergic rhinitis: Secondary | ICD-10-CM

## 2023-08-03 ENCOUNTER — Other Ambulatory Visit: Payer: Self-pay | Admitting: General Practice

## 2023-08-03 DIAGNOSIS — I10 Essential (primary) hypertension: Secondary | ICD-10-CM

## 2023-08-03 MED ORDER — MINOXIDIL 10 MG PO TABS: ORAL_TABLET | ORAL | 1 refills | Status: DC

## 2023-08-03 MED ORDER — HYDROCHLOROTHIAZIDE 25 MG PO TABS: ORAL_TABLET | ORAL | 1 refills | Status: DC

## 2023-08-03 NOTE — Telephone Encounter (Unsigned)
 Copied from CRM [id number]. Topic: Clinical - Medication Refill >> Aug 03, 2023 10:56 AM Dewanda Foots wrote: Medication:  minoxidil  (LONITEN ) 10 MG tablet hydrochlorothiazide  (HYDRODIURIL ) 25 MG tablet   Has the patient contacted their pharmacy? Yes (Agent: If no, request that the patient contact the pharmacy for the refill. If patient does not wish to contact the pharmacy document the reason why and proceed with request.) (Agent: If yes, when and what did the pharmacy advise?)  This is the patient's preferred pharmacy:  [facility] [id number] [place], [place] - [address] [place] [address] Phone: [phone] Fax: [phone]  Is this the correct pharmacy for this prescription? Yes If no, delete pharmacy and type the correct one.   Has the prescription been filled recently? No  Is the patient out of the medication? Yes, completely out of the hydrochlorothiazide  (HYDRODIURIL ) 25 MG tablet and has 4 days left of the minoxidil  (LONITEN ) 10 MG tablet  Has the patient been seen for an appointment in the last year OR does the patient have an upcoming appointment? Yes  Can we respond through MyChart? Yes  Agent: Please be advised that Rx refills may take up to 3 business days. We ask that you follow-up with your pharmacy.

## 2023-08-07 ENCOUNTER — Ambulatory Visit: Payer: Medicare Other | Admitting: Internal Medicine

## 2023-08-15 ENCOUNTER — Encounter: Payer: Self-pay | Admitting: General Practice

## 2023-08-15 DIAGNOSIS — I10 Essential (primary) hypertension: Secondary | ICD-10-CM

## 2023-08-15 MED ORDER — OLMESARTAN MEDOXOMIL 40 MG PO TABS: ORAL_TABLET | ORAL | 1 refills | Status: DC

## 2023-08-15 NOTE — Telephone Encounter (Signed)
 Okay to refill?

## 2023-08-21 ENCOUNTER — Ambulatory Visit (INDEPENDENT_AMBULATORY_CARE_PROVIDER_SITE_OTHER): Admitting: General Practice

## 2023-08-21 ENCOUNTER — Encounter: Payer: Self-pay | Admitting: General Practice

## 2023-08-21 ENCOUNTER — Ambulatory Visit: Payer: Self-pay | Admitting: General Practice

## 2023-08-21 VITALS — BP 118/68 | HR 80 | Temp 98.3°F | Ht 64.25 in | Wt 248.0 lb

## 2023-08-21 DIAGNOSIS — N182 Chronic kidney disease, stage 2 (mild): Secondary | ICD-10-CM | POA: Diagnosis not present

## 2023-08-21 DIAGNOSIS — E1169 Type 2 diabetes mellitus with other specified complication: Secondary | ICD-10-CM

## 2023-08-21 DIAGNOSIS — E785 Hyperlipidemia, unspecified: Secondary | ICD-10-CM

## 2023-08-21 DIAGNOSIS — E1122 Type 2 diabetes mellitus with diabetic chronic kidney disease: Secondary | ICD-10-CM

## 2023-08-21 DIAGNOSIS — I10 Essential (primary) hypertension: Secondary | ICD-10-CM | POA: Diagnosis not present

## 2023-08-21 LAB — COMPREHENSIVE METABOLIC PANEL WITH GFR
ALT: 19 U/L (ref 0–53)
AST: 16 U/L (ref 0–37)
Albumin: 4.7 g/dL (ref 3.5–5.2)
Alkaline Phosphatase: 88 U/L (ref 39–117)
BUN: 21 mg/dL (ref 6–23)
CO2: 29 meq/L (ref 19–32)
Calcium: 9.7 mg/dL (ref 8.4–10.5)
Chloride: 100 meq/L (ref 96–112)
Creatinine, Ser: 1.2 mg/dL (ref 0.40–1.50)
GFR: 63.09 mL/min (ref >60.00)
Glucose, Bld: 152 mg/dL — ABNORMAL HIGH (ref 70–99)
Potassium: 4.1 meq/L (ref 3.5–5.1)
Sodium: 138 meq/L (ref 135–145)
Total Bilirubin: 0.6 mg/dL (ref 0.2–1.2)
Total Protein: 7.8 g/dL (ref 6.0–8.3)

## 2023-08-21 LAB — LIPID PANEL
Cholesterol: 203 mg/dL — ABNORMAL HIGH (ref 0–200)
HDL: 44 mg/dL (ref >39.00)
LDL Cholesterol: 128 mg/dL — ABNORMAL HIGH (ref 0–99)
NonHDL: 158.59
Total CHOL/HDL Ratio: 5
Triglycerides: 151 mg/dL — ABNORMAL HIGH (ref 0.0–149.0)
VLDL: 30.2 mg/dL (ref 0.0–40.0)

## 2023-08-21 LAB — CBC
HCT: 43.1 % (ref 39.0–52.0)
Hemoglobin: 14.6 g/dL (ref 13.0–17.0)
MCHC: 33.9 g/dL (ref 30.0–36.0)
MCV: 82.7 fl (ref 78.0–100.0)
Platelets: 283 10*3/uL (ref 150.0–400.0)
RBC: 5.21 Mil/uL (ref 4.22–5.81)
RDW: 14.6 % (ref 11.5–15.5)
WBC: 8.5 10*3/uL (ref 4.0–10.5)

## 2023-08-21 LAB — TSH: TSH: 1.94 u[IU]/mL (ref 0.35–5.50)

## 2023-08-21 LAB — HEMOGLOBIN A1C: Hgb A1c MFr Bld: 7.5 % — ABNORMAL HIGH (ref 4.6–6.5)

## 2023-08-21 MED ORDER — TIRZEPATIDE 2.5 MG/0.5ML ~~LOC~~ SOAJ: 2.5000 mg | SUBCUTANEOUS | 1 refills | Status: DC

## 2023-08-21 NOTE — Assessment & Plan Note (Signed)
 BP at goal today.   Continue Amlodipine  10 mg once daily, Minoxidil  10 mg once daily and Olmesartan  40 mg once daily and hydrochlorothiazide  25 mg once daily.   F/u in 6 months.

## 2023-08-21 NOTE — Assessment & Plan Note (Addendum)
 Uncontrolled.  Hemoglobin A1c pending.   Discussed the importance of monitoring diet and exercise.  Foot exam completed today.  Pnuemonia vaccine UTD.  Urine ACR UTD Eye exam UTD.  Intolerant to statin.   F/u in 6 months.  He will consider Mounjaro if A1c is still elevated.

## 2023-08-21 NOTE — Progress Notes (Signed)
 Established Patient Office Visit  Subjective   Patient ID: John Andrade, male    DOB: May 27, 1955  Age: 68 y.o. MRN: [id number]  Chief Complaint  Patient presents with   Diabetes    Patient here for DM f/u and labs. Patient only checked BS x 3 weeks and stopped; brought sheet with him.     Diabetes Pertinent negatives for hypoglycemia include no dizziness, headaches or nervousness/anxiousness. Pertinent negatives for diabetes include no chest pain and no polydipsia.   John Andrade is a 68 year old male with past medical history of HTN, aortic atherosclerosis, GERD, hepatic steatosis, HLD, type 2 DM, CKD, DDD morbidly obese, vitamin d  deficiency presents today for a follow up.   Diabetes Mellitus type 2: Current medications include:   He is checking his blood glucose once a day times daily and is getting readings of 140s-170s.  Last A1C: 7.0 on 04/21/23.  Last Eye Exam: My eye doctor on [place]. June 2025.  Last Foot Exam: due Pneumonia Vaccination: UTD Urine Microalbumin: UTD Statin:intolerance to statins.  Dietary changes since last visit: is trying to incorporate more grilled and air fried foods. Has been trying to drink more water. Diet soda every now and then.   Exercise: no regular exercise- limited due to metal in his body. Is physically active throughout the day.    Patient Active Problem List   Diagnosis Date Noted   Establishing care with new doctor, encounter for 05/22/2023   Hepatic steatosis 05/01/2023   Calculus of gallbladder with chronic cholecystitis without obstruction 05/01/2023   Chronic cholecystitis with calculus 04/26/2023   Benign paroxysmal positional vertigo of left ear 12/09/2019   CKD stage 2 due to type 2 diabetes mellitus (HCC) 12/05/2019   Environmental allergies 12/05/2019   B12 deficiency 12/05/2019   Statin myopathy 03/18/2019   GERD (gastroesophageal reflux disease) 07/31/2017   Aortic atherosclerosis (HCC) 04/07/2017   Type 2  diabetes mellitus (HCC) 05/08/2015   Vitamin D  deficiency 05/08/2015   Medication management 05/08/2015   History of nephrolithiasis 05/08/2015   Chronic pain 05/08/2015   DDD (degenerative disc disease), lumbar 05/08/2015   Morbidly obese (HCC)    Hyperlipidemia associated with type 2 diabetes mellitus (HCC)    Essential hypertension    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    GERD (gastroesophageal reflux disease)    takes Omeprazole  daily   History of kidney stones    Hyperlipidemia    Hypertension    takes Lisinopril  and Amlodipine  daily   Multiple allergies    takes Claritin  and Singulair  daily   PONV (postoperative nausea and vomiting)    with his longer surgeries   Vertigo 10/15/2019   peripheral vertigo last episode was in 2019   Past Surgical History:  Procedure Laterality Date   ANKLE FRACTURE SURGERY  1995   right   BACK SURGERY  2002   lumb lam-fusion   BACK SURGERY  03-2014   Dr Gaither in [place]   CARDIAC CATHETERIZATION     2007   CARPAL TUNNEL RELEASE Right 09/24/2019   Procedure: RIGHT CARPAL TUNNEL RELEASE;  Surgeon: Sheril Coy, MD;  Location: [facility];  Service: Orthopedics;  Laterality: Right;   CARPAL TUNNEL RELEASE Left 10/22/2019   Procedure: LEFT CARPAL  TUNNEL RELEASE;  Surgeon: Sheril Coy, MD;  Location: [facility];  Service: Orthopedics;  Laterality: Left;   CHOLECYSTECTOMY N/A 04/26/2023   Procedure: LAPAROSCOPIC CHOLECYSTECTOMY;  Surgeon: Belinda Cough, MD;  Location: [facility];  Service: General;  Laterality: N/A;   CHONDROPLASTY Left 10/28/2014   Procedure: CHONDROPLASTY;  Surgeon: Coy Sheril, MD;  Location: [facility];  Service: Orthopedics;  Laterality: Left;   COLONOSCOPY     ELBOW LIGAMENT RECONSTRUCTION Right 2015   [place]   KNEE ARTHROSCOPY Left 04/24/2012   Procedure:  ARTHROSCOPY KNEE, PARTIAL MEDIAL MENISECTOMY AND CHONDROPLASTY;  Surgeon: Coy KANDICE Sheril, MD;  Location: [facility];  Service: Orthopedics;  Laterality: Left;   KNEE ARTHROSCOPY WITH MEDIAL MENISECTOMY Left 10/28/2014   Procedure: LEFT ARTHROSCOPY KNEE, PARTIAL MEDIAL MENISECTOMY, CHONDROPLASTY;  Surgeon: Coy Sheril, MD;  Location: [facility];  Service: Orthopedics;  Laterality: Left;   SHOULDER ARTHROSCOPY WITH BICEPSTENOTOMY Right 04/13/2020   Procedure: SHOULDER ARTHROSCOPY WITH BICEPSTENOTOMY;  Surgeon: Sheril Coy, MD;  Location: [facility];  Service: Orthopedics;  Laterality: Right;   TOTAL HIP ARTHROPLASTY Right 09/22/2015   Procedure: TOTAL HIP ARTHROPLASTY ANTERIOR APPROACH;  Surgeon: Coy Sheril, MD;  Location: [facility];  Service: Orthopedics;  Laterality: Right;   TOTAL HIP ARTHROPLASTY Left 01/05/2016   Procedure: LEFT TOTAL HIP ARTHROPLASTY ANTERIOR APPROACH;  Surgeon: Coy Sheril, MD;  Location: [facility];  Service: Orthopedics;  Laterality: Left;   TRIGGER FINGER RELEASE Right 09/24/2019   Procedure: RIGHT RELEASE TRIGGER FINGER/A-1 PULLEY;  Surgeon: Sheril Coy, MD;  Location: [facility];  Service: Orthopedics;  Laterality: Right;   WISDOM TOOTH EXTRACTION     Allergies  Allergen Reactions   Statins Other (See Comments)    Flu-like symptoms   Tylenol  [Acetaminophen ]     Liver enzymes elevated when taking too much tylenol     Phentermine Other (See Comments)    constipation         08/21/2023   10:43 AM 05/22/2023   10:37 AM 02/12/2023    9:29 PM  Depression screen PHQ 2/9  Decreased Interest 0 0 0  Down, Depressed, Hopeless 0 0 0  PHQ - 2 Score 0 0 0  Altered sleeping 0 0   Tired, decreased energy 0 1   Change in appetite 0 0   Feeling bad or failure about yourself  0 0   Trouble concentrating 0 0   Moving slowly or fidgety/restless 0 0   Suicidal thoughts 0 0   PHQ-9 Score 0 1   Difficult doing work/chores Not  difficult at all Not difficult at all        08/21/2023   10:43 AM 05/22/2023   10:37 AM  GAD 7 : Generalized Anxiety Score  Nervous, Anxious, on Edge 0 0  Control/stop worrying 0 0  Worry too much - different things 0 0  Trouble relaxing 0 0  Restless 0 0  Easily annoyed or irritable 0 0  Afraid - awful might happen 0 0  Total GAD 7 Score 0 0  Anxiety Difficulty Not difficult at all Not difficult at all      Review of Systems  Constitutional:  Negative for chills and fever.  Respiratory:  Negative for shortness of breath.   Cardiovascular:  Negative for chest pain.  Gastrointestinal:  Negative for abdominal pain, constipation, diarrhea, heartburn, nausea and vomiting.  Genitourinary:  Negative for dysuria, frequency and urgency.  Neurological:  Negative for dizziness and headaches.  Endo/Heme/Allergies:  Negative for polydipsia.  Psychiatric/Behavioral:  Negative for depression and suicidal ideas. The patient is not nervous/anxious.       Objective:     BP 118/68   Pulse 80   Temp 98.3 F (36.8 C) (Temporal)   Ht 5' 4.25 (1.632 m)   Wt 248 lb (112.5 kg)   SpO2 98%   BMI 42.24 kg/m  BP Readings from Last 3 Encounters:  08/21/23 118/68  05/22/23 120/68  04/27/23 134/81   Wt Readings from Last 3 Encounters:  08/21/23 248 lb (112.5 kg)  05/22/23 247 lb (112 kg)  04/26/23 249 lb 5.4 oz (113.1 kg)      Physical Exam Vitals and nursing note reviewed.  Constitutional:      Appearance: Normal appearance.   Cardiovascular:     Rate and Rhythm: Normal rate and regular rhythm.     Pulses: Normal pulses.     Heart sounds: Normal heart sounds.  Pulmonary:     Effort: Pulmonary effort is normal.     Breath sounds: Normal breath sounds.   Skin:    Capillary Refill: Capillary refill takes less than 2 seconds.   Neurological:     Mental Status: He is alert and oriented to person, place, and time.   Psychiatric:        Mood and Affect: Mood normal.         Behavior: Behavior normal.        Thought Content: Thought content normal.        Judgment: Judgment normal.    Diabetic Foot Exam - Simple   Simple Foot Form Diabetic Foot exam was performed with the following findings: Yes 08/21/2023 10:55 AM  Visual Inspection No deformities, no ulcerations, no other skin breakdown bilaterally: Yes Sensation Testing Intact to touch and monofilament testing bilaterally: Yes Pulse Check Posterior Tibialis and Dorsalis pulse intact bilaterally: Yes Comments      No results found for any visits on 08/21/23.     The 10-year ASCVD risk score (Arnett DK, et al., 2019) is: 29.5%    Assessment & Plan:  Type 2 diabetes mellitus with stage 2 chronic kidney disease, without long-term current use of insulin  (HCC) Assessment & Plan: Uncontrolled.  Hemoglobin A1c pending.   Discussed the importance of monitoring diet and exercise.  Foot exam completed today.  Pnuemonia vaccine UTD.  Urine ACR UTD Eye exam UTD.  Intolerant to statin.   F/u in 6 months.  He will consider Mounjaro if A1c is still elevated.  Orders: -     Lipid panel -     Hemoglobin A1c -     Comprehensive metabolic panel with GFR -     TSH -     CBC  Hyperlipidemia associated with type 2 diabetes mellitus (HCC) Assessment & Plan: Lipid panel pending.   Continue zetia  10 mg once daily.  Intolerant to statin.  Orders: -     Lipid panel -     Hemoglobin A1c -     Comprehensive metabolic panel with GFR -     TSH -     CBC  Essential hypertension Assessment & Plan: BP at goal today.   Continue Amlodipine  10 mg once daily, Minoxidil  10 mg once daily and Olmesartan  40 mg once daily and hydrochlorothiazide  25 mg once daily.   F/u in 6 months.   Morbidly obese (HCC) Assessment & Plan: Long discussion about weight loss, diet, and exercise Recommended diet heavy in fruits and veggies and low  in animal meats, cheeses, and dairy products, appropriate calorie  intake Patient will work on decreasing saturated fats and simple carbs  Increase lean proteins and activity.      Return in about 6 months (around 02/20/2024) for chronic care management.SABRA Carrol Aurora, NP

## 2023-08-21 NOTE — Assessment & Plan Note (Signed)
 Lipid panel pending.   Continue zetia  10 mg once daily.  Intolerant to statin.

## 2023-08-21 NOTE — Assessment & Plan Note (Addendum)
 Long discussion about weight loss, diet, and exercise Recommended diet heavy in fruits and veggies and low in animal meats, cheeses, and dairy products, appropriate calorie intake Patient will work on decreasing saturated fats and simple carbs  Increase lean proteins and activity.

## 2023-08-21 NOTE — Patient Instructions (Addendum)
 Stop by the lab prior to leaving today. I will notify you of your results once received.   Schedule 2nd of shingles vaccine at the pharmacy. Last dose was in 2019.  I will be in touch if we need to change any medication or start anything new.   Follow up in 6 months.  It was a pleasure to see you today!

## 2023-08-22 NOTE — Addendum Note (Signed)
 Addended by: VINCENTE SHIVERS on: 08/22/2023 02:29 PM   Modules accepted: Orders

## 2023-08-23 ENCOUNTER — Telehealth: Payer: Self-pay

## 2023-08-23 NOTE — Progress Notes (Signed)
 Complex Care Management Note  Care Guide Note 08/23/2023 Name: John Andrade MRN: [id number] DOB: 05/06/1955  John Andrade is a 68 y.o. year old male who sees Vincente Shivers, NP for primary care. I reached out to John Andrade by phone today to offer complex care management services.  [name] was given information about Complex Care Management services today including:   The Complex Care Management services include support from the care team which includes your Nurse Care Manager, Clinical Social Worker, or Pharmacist.  The Complex Care Management team is here to help remove barriers to the health concerns and goals most important to you. Complex Care Management services are voluntary, and the patient may decline or stop services at any time by request to their care team member.   Complex Care Management Consent Status: Patient agreed to services and verbal consent obtained.   Follow up plan:  Telephone appointment with complex care management team member scheduled for:  08/24/23 & 09/18/23.   Encounter Outcome:  Patient Scheduled  Dreama [facility]  [facility], [facility] Care Management Assistant Direct Dial: [phone]  Fax: [phone]

## 2023-08-24 ENCOUNTER — Encounter: Payer: Self-pay | Admitting: Pharmacist

## 2023-08-24 ENCOUNTER — Other Ambulatory Visit (INDEPENDENT_AMBULATORY_CARE_PROVIDER_SITE_OTHER): Admitting: Pharmacist

## 2023-08-24 DIAGNOSIS — E1169 Type 2 diabetes mellitus with other specified complication: Secondary | ICD-10-CM

## 2023-08-24 NOTE — Progress Notes (Signed)
 Patient Assistance Program (PAP) Application   Manufacturer: [employer]    (New enrollment) Medication(s): Ozempic   Patient Portion of Application:  7/3: Completed with patient via online enrollment tool. Submitted.   Provider Portion of Application:  7/3: Completed with provider consent via online re-enrollment portal. Prescription(s): Included in MAP application. Ozempic  0.25 (1 box) Ozempic  0.5 (2 box) - per c/f some adverse effect prior. A1c close to goal, slower titration reasonable  Ozempic  1.0 (1 box) - can microdose to lower dose if not tolerated

## 2023-08-24 NOTE — Progress Notes (Signed)
 08/28/2023 Name: John Andrade MRN: [id number] DOB: 1955/11/21  Subjective  Chief Complaint  Patient presents with   Diabetes    Reason for visit: ?  John Andrade is a 68 y.o. male with a history of diabetes (type 2), who presents today for an initial diabetes pharmacotherapy visit.? 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  Known DM Complications: nephropathy with hx microalbuminuria    Care Team: Primary Care Provider: Vincente Shivers, NP   Date of Last Diabetes Related Visit: with PCP on 08/21/23  Recent Summary of Change: Start Mounjaro    Medication Access/Adherence: Prescription drug coverage: Payor: [employer] MEDICARE / Plan: [facility] MEDICARE / Product Type: *No Product type* / .  - Reports that all medications are not affordable. Mounjaro  >$300 for first month - Current Patient Assistance: None  Since Last visit / History of Present Illness: ?  Patient reports doing well since last visit. Currently with untreated diabetes 2/2 high cost of Mounjaro  prescription.   Patient confirms previous use of Ozempic  for 6 weeks at the end of last year. Stopped in January once deductible reset. Denies side effects with first four doses of Ozempc 0.25 mg though some GI discomfort with increase to 0.5 mg dose. After stopping Ozempic  noted significant diarrhea.   Reported DM Regimen: ?  N/A   DM medications tried in the past:?  Metformin  (two trials. Both with diarrhea) Ozempic  (took for ~6 weeks, stopped in January d/t deductible. Tolerated well. Notes diarrhea after stopping)  SMBG: Not checking    Hypo/Hyperglycemia: ?  Symptoms of hypoglycemia since last visit:? no  Symptoms of hyperglycemia since last visit:? no  Reported Diet: Patient typically eats 2-3 meals per day. Generally does not skip meals.  Trying to incorporate more grilled and air  fried foods. Has been trying to drink more water. Diet soda every now and then.   Exercise: No formal routine. Is physically active throughout the day.   DM Prevention:  Statin: Intolerant to statins (myalgia)?  History of chronic kidney disease? yes History of albuminuria? yes, last UACR on 01/30/23 = 194 mg/g ACE/ARB - Taking olmesartan  40 mg; Urine MA/CR Ratio - elevated urinary albumin  excretion.  Last eye exam: June 2025 ([facility]): Results not received Last foot exam: 08/21/2023 Tobacco Use: Former smoker  Immunizations:? Flu: Up to date (Last: 01/30/2023); Pneumococcal: Up to Date PCV20 (2025; received after age 48); Shingrix: Due (Last: 10/16/2017, no record of second dose)  Cardiovascular Risk Reduction History of clinical ASCVD? no The 10-year ASCVD risk score (Arnett DK, et al., 2019) is: 26.9% History of heart failure? no History of hyperlipidemia? yes Current BMI: 42.2 kg/m2 (Ht 64.25 in, Wt 112.5 kg) Taking statin? declines due to adverse effects;  (on ezetimibe  10 mg) Taking aspirin ? not indicated; Not taking   Taking SGLT-2i? no Taking GLP- 1 RA? no      _______________________________________________  Objective    Review of Systems:? Limited in the setting of virtual visit  GI:? No nausea, vomiting, constipation, diarrhea, abdominal pain, dyspepsia, change in bowel habits  Endocrine:? No polyuria, polyphagia or blurred vision   Vitals:  Wt Readings from Last 3 Encounters:  08/21/23 248 lb (112.5 kg)  05/22/23 247 lb (112 kg)  04/26/23 249 lb 5.4 oz (113.1 kg)   BP Readings from Last 3 Encounters:  08/21/23 118/68  05/22/23 120/68  04/27/23 134/81   Pulse Readings from Last  3 Encounters:  08/21/23 80  05/22/23 72  04/27/23 78     Labs:?  Lab Results  Component Value Date   HGBA1C 7.5 (H) 08/21/2023   HGBA1C 7.0 (H) 04/21/2023   HGBA1C 7.7 (H) 01/30/2023   GLUCOSE 152 (H) 08/21/2023   MICRALBCREAT 194 (H) 01/30/2023   MICRALBCREAT 96 (H)  04/07/2017   MICRALBCREAT CANCELED 05/08/2015   CREATININE 1.20 08/21/2023   CREATININE 1.07 05/22/2023   CREATININE 1.16 04/27/2023   GFR 63.09 08/21/2023   GFR 72.52 05/22/2023    Lab Results  Component Value Date   CHOL 203 (H) 08/21/2023   LDLCALC 128 (H) 08/21/2023   LDLCALC 174 (H) 01/30/2023   LDLCALC 206 (H) 04/12/2022   HDL 44.00 08/21/2023   TRIG 151.0 (H) 08/21/2023   TRIG 125 01/30/2023   TRIG 177 (H) 04/12/2022   ALT 19 08/21/2023   ALT 17 05/22/2023   AST 16 08/21/2023   AST 15 05/22/2023      Chemistry      Component Value Date/Time   NA 138 08/21/2023 1109   K 4.1 08/21/2023 1109   CL 100 08/21/2023 1109   CO2 29 08/21/2023 1109   BUN 21 08/21/2023 1109   CREATININE 1.20 08/21/2023 1109   CREATININE 1.11 01/30/2023 1049      Component Value Date/Time   CALCIUM  9.7 08/21/2023 1109   ALKPHOS 88 08/21/2023 1109   AST 16 08/21/2023 1109   ALT 19 08/21/2023 1109   BILITOT 0.6 08/21/2023 1109       The 10-year ASCVD risk score (Arnett DK, et al., 2019) is: 26.9%  Assessment and Plan:   1. Diabetes, type 2: uncontrolled per last A1c of 7.5% (6/30), increased from previous 7.0% with goal <7% without hypoglycemia. Current untreated due to med access concerns (high deductible). Reported income should be eligible for PAP. Reviewed Ozempic  vs Mounjaro . Willing to restart Ozempic  if approved for PAP. Out of town/traveling the next ~6 weeks.  Current Regimen: None [employer] PAP application initiated for Ozempic    Future Consideration: GLP1-RA: Titration to max tolerated dose. If c/f side effects upon 0.5 mg titration, reasonable to take titrations more slowly.  SGLT2i: May be eligible for PAP. Reasonable consideration in setting of CKD w hx albuminuria.    2. HTN: controlled based on last clinic BP of 118/68 mmHg (08/21/23), goal <130/80 mmHg. Does not monitor BP at home. Denies lightheadedness, dizziness, SOB, CP, vision changes.  Current Regimen: olmesartan  40  mg/d, hydrochlorothiazide  25 mg/d Continue medications without changes.    3. ASCVD (primary prevention): Uncontrolled on last lipid panel with LDL 128 mg/dL, TG 848 mg/dL (3/69/74). LDL goal <70 mg/dL (primary prevention, diabetes).  Key risk factors include: diabetes, hypertension (well-controlled), hyperlipidemia, former smoker, and BMI >30 kg/m2 The 10-year ASCVD risk score (Arnett DK, et al., 2019) is: 26.9% indicated patient is at high risk.  Continue medications today without changes.  Future Consideration: Consider PSCK9i (may be eligible for [employer] if insurance approves PCSK9i therapy)  4. Healthcare Maintenance:  Pneumococcal - Current status: complete  Shingles - Current status: Second dose not documented - Due Influenza - Current status: Up to date  Due to receive the following vaccines: Shingrix   Follow Up Follow up with clinical pharmacist via phone in 6 weeks Patient given direct line for questions regarding medication therapy and MAP applications  Future Appointments  Date Time Provider Department Center  09/18/2023 10:00 AM Devra Lands, RN [facility] None  10/04/2023 10:30 AM [facility]  PHARMACIST [facility]    Manuelita FABIENE Kobs, PharmD Clinical Pharmacist [facility] [phone]

## 2023-08-29 ENCOUNTER — Telehealth: Payer: Self-pay | Admitting: General Practice

## 2023-08-29 NOTE — Telephone Encounter (Signed)
 Copied from CRM [id number]. Topic: General - Other >> Aug 29, 2023 10:27 AM [place] A wrote: Reason for CRM: [employer] called [facility] ) contact number [phone] Fax number [phone]; Is asking for Healthcare provider expiration date to complete the forms. Patient ID number  is [id number]

## 2023-08-29 NOTE — Telephone Encounter (Signed)
 Forms have been printed for provider to sign

## 2023-08-29 NOTE — Telephone Encounter (Signed)
 Forms have been faxed to [employer]

## 2023-09-01 MED ORDER — OMEPRAZOLE 40 MG PO CPDR: DELAYED_RELEASE_CAPSULE | ORAL | 1 refills | Status: DC

## 2023-09-01 NOTE — Addendum Note (Signed)
 Addended by: TENNIE RAISIN B on: 09/01/2023 11:26 AM   Modules accepted: Orders

## 2023-09-18 ENCOUNTER — Other Ambulatory Visit: Payer: Self-pay

## 2023-09-18 ENCOUNTER — Encounter: Payer: Self-pay | Admitting: Pharmacist

## 2023-09-18 NOTE — Patient Outreach (Signed)
 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  Visit completed with Patient  on the phone  Background:   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    GERD (gastroesophageal reflux disease)    takes Omeprazole  daily   History of kidney stones    Hyperlipidemia    Hypertension    takes Lisinopril  and Amlodipine  daily   Multiple allergies    takes Claritin  and Singulair  daily   PONV (postoperative nausea and vomiting)    with his longer surgeries   Vertigo 10/15/2019   peripheral vertigo last episode was in 2019    Assessment: Patient Reported Symptoms:  Cognitive Cognitive Status: Alert and oriented to person, place, and time, Insightful and able to interpret abstract concepts, Normal speech and language skills Cognitive/Intellectual Conditions Management [RPT]: None reported or documented in medical history or problem list   Health Maintenance Behaviors: Annual physical exam, Spiritual practice(s), Sleep adequate Healing Pattern: Unsure Health Facilitated by: Prayer/meditation  Neurological Neurological Review of Symptoms: No symptoms reported Neurological Management Strategies: Routine screening  HEENT HEENT Symptoms Reported: No symptoms reported HEENT Management Strategies: Routine screening    Cardiovascular Cardiovascular Symptoms Reported: No symptoms reported Cardiovascular Comment: occasioanl swelling in feet if sits too long, relief with moving around  Respiratory Respiratory Symptoms Reported: No symptoms reported Respiratory Management Strategies: Routine screening, Medication therapy  Endocrine Endocrine Symptoms Reported:  No symptoms reported Is patient diabetic?: Yes Is patient checking blood sugars at home?: No Endocrine Self-Management Outcome: 3 (uncertain) Endocrine Comment: occasionally checks BG - not regularly, patient declines, to check regulalry, will try 3X week FBG, upset has kidney disease - discussed connection between Kidneys and DM/HTN, upset being told morbidly obese when doctors have weight issues,  Gastrointestinal Gastrointestinal Symptoms Reported: No symptoms reported Gastrointestinal Management Strategies: Medication therapy Gastrointestinal Comment: 04/26/23 lap chole, still learning what he can tolerate, issues with spicy/fally/fried food - avoids    Genitourinary Genitourinary Symptoms Reported: No symptoms reported    Integumentary   Skin Management Strategies: Routine screening  Musculoskeletal Musculoskelatal Symptoms Reviewed: Back pain, Joint pain, Muscle pain Additional Musculoskeletal Details: multiple joint surgeries  causing chronic pain, daily pain rating depends on activity, when sitting minimal, shen up for long period doing activities 5/10, relief with rest Musculoskeletal Management Strategies: Adequate rest, Routine screening Falls in the past year?: No Number of falls in past year: 1 or less Was there an injury with Fall?: No Fall Risk Category Calculator: 0 Patient Fall Risk Level: Low Fall Risk Patient at Risk for Falls Due to: History of fall(s) (has not fallen in 4-5 years (last was fall down stairs with concussion)) Fall risk Follow up: Falls evaluation completed, Falls prevention discussed  Psychosocial Psychosocial Symptoms Reported: No symptoms reported Behavioral Management Strategies: Coping strategies Major Change/Loss/Stressor/Fears (CP): Denies Techniques to Cope with Loss/Stress/Change: Spiritual practice(s) Quality of Family Relationships: helpful, stressful, involved Do you feel physically threatened by others?: No      09/18/2023   10:42 AM   Depression screen PHQ 2/9  Decreased Interest 0  Down, Depressed, Hopeless 0  PHQ - 2 Score 0  There were no vitals filed for this visit.  Medications Reviewed Today     Reviewed by Devra Lands, RN (Registered Nurse) on 09/18/23 at 1023  Med List Status: <None>   Medication Order Taking? Sig Documenting Provider Last Dose Status Informant  amLODipine  (NORVASC ) 10 MG tablet [id number] Yes TAKE 1 TABLET BY MOUTH EVERY MORNING FOR BLOOD PRESSURE Vincente Shivers, NP  Active   Blood Glucose Monitoring Suppl DEVI [id number] Yes 1 each by Does not apply route in the morning, at noon, and at bedtime. May substitute to any manufacturer covered by patient's insurance. Vincente Shivers, NP  Active   Cholecalciferol (VITAMIN D3) 125 MCG (5000 UT) capsule [id number] Yes Take 2 capsules ( 10,000 units)  Daily  Patient taking differently: Take 5,000 Units by mouth daily.   Tonita Fallow, MD  Active Self  ezetimibe  (ZETIA ) 10 MG tablet [id number] Yes Take 1 tablet (10 mg total) by mouth daily. Laurice President, NP  Active Self  hydrochlorothiazide  (HYDRODIURIL ) 25 MG tablet [id number] Yes TAKE 1 TABLET BY MOUTH EVERY MORNING FOR FOR BLOOD PRESSURE AND FLUID RETENTION/ANKLE SWELLING Vincente Shivers, NP  Active   L-Lysine 500 MG TABS [id number] Yes Take 500 mg by mouth daily.  [provider]  Active Self  loratadine  (CLARITIN ) 10 MG tablet [id number] Yes Take 10 mg by mouth daily. [provider]  Active Self  minoxidil  (LONITEN ) 10 MG tablet [id number] Yes Take  1 tablet  at Night  for  BP Vincente Shivers, NP  Active   montelukast  (SINGULAIR ) 10 MG tablet [id number] Yes Take  1 tablet  Daily for Allergies                                /                                                                   TAKE                                         BY                                                 MOUTH Webb, Padonda B, FNP  Active   olmesartan  (BENICAR ) 40 MG tablet [id number] Yes TAKE 1 TABLET BY  MOUTH DAILY FOR BLOOD PRESSURE Vincente Shivers, NP  Active   omeprazole  (PRILOSEC) 40 MG capsule [id number] Yes Take  1 capsule  2 x /day  to Prevent Heartburn & Indigestion                                                  /  TAKE                                         BY                                                 MOUTH Vincente Shivers, NP  Active   tirzepatide  (MOUNJARO ) 2.5 MG/0.5ML Pen [id number]  Inject 2.5 mg into the skin once a week.  Patient not taking: Reported on 09/18/2023   Vincente Shivers, NP  Consider Medication Status and Discontinue             Recommendation:   PCP Follow-up  Follow Up Plan:   Closing From:  Complex Care Management Patient Declined Services at this time, PCP notified. Aware can notify PCP if would like to resume and agreed to call in 6 Emmanuelle Hibbitts. Information on DM and CKD sent via MyChart.   Nestora Duos, MSN, RN [facility], [facility] RN Care Manager Direct Dial: [phone] Fax: [phone]

## 2023-09-18 NOTE — Patient Instructions (Signed)
 Visit Information  Thank you for taking time to visit with me today. Please don't hesitate to contact me if I can be of assistance to you before our next scheduled appointment.  You have declined enrollment in RN Complex Care Management. I will call you in 6 Semaj Andrade to determine if you are interested in resuming services.     Following is a copy of your care plan:   Goals Addressed   None     Please call the Suicide and Crisis Lifeline: 988 call the [place]  National Suicide Prevention Lifeline: 5205044929 or TTY: 818-400-2962 TTY 940-810-7021) to talk to a trained counselor call 1-800-273-TALK (toll free, 24 hour hotline) go to [facility] [address], [place] [phone]) call 911 if you are experiencing a Mental Health or Behavioral Health Crisis or need someone to talk to.  Patient information provided in MyChart.   John Duos, MSN, RN [place]  [facility], [facility] RN Care Manager Direct Dial: [phone] Fax: [phone]  Chronic Kidney Disease in Adults: What to Know Chronic kidney disease (CKD) is when lasting damage happens to the kidneys slowly over time. The kidneys are two organs that do many important things in the body. These include: Taking waste and extra fluid out of the blood to make pee (urine). Making hormones. Keeping the right amount of fluids and chemicals in the body. A small amount of kidney damage may not cause problems. You must take steps to help keep the kidney damage from getting worse. A lot of damage may cause kidney failure. Kidney failure means the kidneys can no longer work right. What are the causes? Diabetes. High blood pressure. Diseases that affect the heart and blood vessels. Other kidney diseases. Diseases that affect the body's defense system (immune system). A problem with the flow of pee. This may be caused by: Kidney stones. Cancer. An enlarged  prostate, in males. A kidney infection or urinary tract infection (UTI) that keeps coming back. What increases the risk? Getting older. The chances of having CKD increase with age. A family history of kidney disease or kidney failure. Having a disease caused by genes. Taking medicines that can harm the kidneys. Being near or having contact with harmful substances. Being very overweight. Using tobacco now or in the past. What are the signs or symptoms? Common symptoms of CKD include: Feeling very tired and having less energy. Swelling of the face, legs, ankles, or feet. Throwing up or feeling like you may throw up. Not wanting to eat as much as normal. Being confused or not able to focus. Twitches and cramps in the leg muscles or other muscles. Dry, itchy skin. Other symptoms may include: Shortness of breath. Trouble sleeping. Making less pee, or making more pee, especially at night. A taste of metal in your mouth. You may also become anemic. Anemia means there's not enough red blood cells in your blood. You may get symptoms slowly. You may not notice them until the kidney damage gets very bad. How is this diagnosed? CKD may be diagnosed based on: Tests on your blood or pee. Imaging tests, like an ultrasound or a CT scan. A kidney biopsy. For this test, a sample of kidney tissue is removed to be looked at under a microscope. These tests will help to find out how serious the CKD is. How is this treated? Often, there's no cure for CKD. Treatment can help with symptoms and help keep the disease from getting worse. Treatment may  include: Treating other problems that are causing your CKD or making it worse. Diet changes. You may need to: Avoid alcohol. Avoid foods that are high in salt, potassium, phosphorous, and protein. Taking medicines for symptoms and to help control other conditions. Dialysis. This treatment gets harmful waste out of your body. It may be needed if you have  kidney failure. Follow these instructions at home: Medicines Take your medicines only as told. The amount of some medicines you take may need to be changed. Do not take any new medicines, vitamins, or supplements unless your health care provider says it's okay. These may make kidney damage worse. Lifestyle Do not smoke, vape, or use nicotine or tobacco. If you drink alcohol: Limit how much you have to: 0-1 drink a day if you're male. 0-2 drinks a day if you're male. Know how much alcohol is in your drink. In the [place] one drink is one 12 oz bottle of beer (355 mL), one 5 oz glass of wine (148 mL), or one 1 oz glass of hard liquor (44 mL). Stay at a healthy weight. If you need help, ask your provider. General instructions  Eat and drink as told. Track your blood pressure at home. Tell your provider about any changes. If you have diabetes, track your blood sugar as told. Exercise at least 30 minutes a day, 5 days a week. Keep your shots (vaccinations) up to date. Keep all follow-up visits. Your provider may need to change your treatments over time. Where to find support American Kidney Fund: [place].com.au Kidney School: kidneyschool.org American Association of Kidney Patients: https://www.miller-montoya.com/ Where to find more information National Kidney Foundation: kidney.org Centers for Disease Control and Prevention. To learn more: Go to DiningCalendar.de. Click Search. Type chronic kidney disease in the search box. Contact a health care provider if: You have new symptoms. You get symptoms of end-stage kidney disease. These include: Headaches. Numbness in your hands or feet. Leg cramps. Easy bruising. Get help right away if: You have a fever. You make less pee than usual. You have pain or bleeding when you pee or poop. You have chest pain. You have shortness of breath. These symptoms may be an emergency. Call 911 right away. Do not wait to see if the symptoms will go away. Do not drive yourself to  the hospital. This information is not intended to replace advice given to you by your health care provider. Make sure you discuss any questions you have with your health care provider. Document Revised: 12/20/2022 Document Reviewed: 08/12/2022 Elsevier Patient Education  2024 Elsevier Inc.  Diabetes: Self-Care  Diabetes mellitus, or diabetes, is a long-term (chronic) disease. It happens when your body doesn't properly use the sugar, or glucose, that's released from food after you eat. Diabetes may happen if: Your pancreas doesn't make enough of a hormone called insulin . Your body doesn't react like it should to the insulin  that it makes. Insulin  lets sugar go into the cells in your body. This gives you energy. If you have diabetes, the sugar can't get into the cells. This causes high blood sugar. How to treat and manage diabetes You may need to take insulin  or other medicines each day to keep your blood sugar in balance. If you need to take insulin , you'll learn how to give it to yourself as a shot. You may need to change the amount of insulin  you take based on the foods you eat. You'll need to check your blood sugar levels with a glucose monitor. The  readings will show if you have low or high blood sugar. In general, you should have these blood sugar levels: Before meals: 80-130 mg/dL (4.4-7.2 mmol/L). After meals: below 180 mg/dL (10 mmol/L). Hemoglobin A1c (HbA1c) level: less than 7%. Your health care provider will set treatment goals for you. Keep all follow-up visits. Your provider will watch you closely to make sure treatment is working. Follow these instructions at home: Diabetes medicines Take your medicines every day as told by your provider. List your medicines here: Name of medicine: ______________________________ Amount (dose): _______________ Time (a.m./p.m.): _______________ Notes: ___________________________________ Name of medicine: ______________________________ Amount  (dose): _______________ Time (a.m./p.m.): _______________ Notes: ___________________________________ Name of medicine: ______________________________ Amount (dose): _______________ Time (a.m./p.m.): _______________ Notes: ___________________________________ Insulin  If you use insulin , list the types of insulin  you use here: Insulin  type: ______________________________ Amount (dose): _______________ Time (a.m./p.m.): _______________Notes: ___________________________________ Insulin  type: ______________________________ Amount (dose): _______________ Time (a.m./p.m.): _______________ Notes: ___________________________________ Insulin  type: ______________________________ Amount (dose): _______________ Time (a.m./p.m.): _______________ Notes: ___________________________________ Insulin  type: ______________________________ Amount (dose): _______________ Time (a.m./p.m.): _______________ Notes: ___________________________________ Insulin  type: ______________________________ Amount (dose): _______________ Time (a.m./p.m.): _______________ Notes: ___________________________________ Managing blood sugar  Check your blood sugar levels with a glucose monitor as told by your provider. Write down the times that you check your levels here: Time: _______________ Notes: ___________________________________ Time: _______________ Notes: ___________________________________ Time: _______________ Notes: ___________________________________ Time: _______________ Notes: ___________________________________ Time: _______________ Notes: ___________________________________ Time: _______________ Notes: ___________________________________  Low blood sugar Low blood sugar is when the amount of sugar in your blood is at or below 70 mg/dL (3.9 mmol/L). Symptoms may include: Feeling: Hungry. Sweaty and clammy. Irritable or easily upset. Dizzy. Sleepy. Having: A fast heartbeat. A headache. A change in your  eyesight. Numbness around your mouth, lips, or tongue. Having trouble with: Moving your body, or coordination. Sleeping. Treating low blood sugar If you have low blood sugar, eat or drink something with sugar in it right away. If you can think clearly and swallow safely, follow the 15:15 rule: Take 15 gramsof a fast-acting carbohydrate (carb). Options include: 4 oz (120 mL) of fruit juice. 4 oz (120 mL) of soda (not diet soda). A few pieces of hard candy. Check food labels to see how many pieces to eat. 1 Tbsp (15 mL) of sugar or honey. 4 glucose tablets. 1 tube of glucose gel. Check your blood sugar 15 minutes after you take the carb. If your blood sugar is still at or below 70 mg/dL (3.9 mmol/L), take 15 grams of a carb again. If your blood sugar doesn't go above 70 mg/dL (3.9 mmol/L) after 3 tries, get help right away. After your blood sugar goes back to normal, eat a meal or a snack within 1 hour. Treating very low blood sugar If your blood sugar is less than 54 mg/dL (3 mmol/L), it's an emergency. Get help right away. If you can't eat or drink, you will need to be given glucagon. A family member or friend should learn how to check your blood sugar and give you glucagon. Ask your provider if you should keep a glucagon kit at home. You may also need to be treated in a hospital. Questions to ask your health care provider Should I talk with a diabetes educator? What tools will I need to care for myself at home? What medicines do I need? When should I take them? How often do I need to check my blood sugar levels? What number can I call if I have questions? When is my follow-up visit? Where  can I find a support group for people with diabetes? Where to find more information American Diabetes Association (ADA): diabetes.org Association of Diabetes Care and Education Specialists: diabeteseducator.org Contact a health care provider if: Your blood sugar is at or above 240 mg/dL (86.6  mmol/L) for 2 days in a row. You've been sick or have had a fever for 2 days or more, and you're not getting better. For more than 6 hours: You can't eat or drink. You feel nauseous. You throw up. You have diarrhea. Get help right away if: Your blood sugar is below 54 mg/dL (3 mmol/L). You get confused. You have trouble thinking clearly. You have trouble breathing. These symptoms may be an emergency. Get help right away. Call 911. Do not wait to see if the symptoms will go away. Do not drive yourself to the hospital. This information is not intended to replace advice given to you by your health care provider. Make sure you discuss any questions you have with your health care provider. Document Revised: 06/24/2022 Document Reviewed: 06/24/2022 Elsevier Patient Education  2024 Elsevier Inc.   Hyperglycemia Hyperglycemia is when the amount of sugar, or glucose, in your blood is too high. High blood sugar can happen if you have diabetes or if you don't have diabetes. It may be an emergency. What are the causes? If you have diabetes, high blood sugar may be caused by: Medicines that increase blood sugar. Not giving yourself enough insulin  (if you take it). Being less active than normal. Eating more than planned. Illness, an injury, or an infection. Having surgery. Stress. If you don't have diabetes, high blood sugar may be caused by: Certain medicines, such as steroids or thiazide diuretics. Stress. A bad illness or infection. Having surgery. Diseases of the pancreas. What increases the risk? You're more likely to have high blood sugar if: Someone in your family has diabetes. You're overweight. You aren't active. You have or have had: Prediabetes. Diabetes when pregnant. Polycystic ovarian syndrome (PCOS). What are the signs or symptoms? High blood sugar may not cause symptoms. If you do have symptoms, they may include: Feeling more thirsty than normal. Needing to pee  more often than normal. Hunger. Feeling very tired. Blurry eyesight. You may have other symptoms if your high blood sugar isn't treated. These may include: Pain in your belly. A headache. Weakness. Weight loss that's not planned. A tingling or numb feeling in your hands or feet. Cuts or bruises that heal slowly. How is this diagnosed?  High blood sugar is diagnosed with a blood test. This test tells you how much sugar is in your blood. It's done while you're having symptoms.  Your health care provider may also do a physical exam, look at your medical history, and do more blood tests. These tests may include: A fasting blood glucose (FBG) test. You can't eat for at least 8 hours before this test. An A1C test. A glucose tolerance test. How is this treated? Treatment may include: Taking medicine to control your blood sugar levels. Changing your medicine or how much you take if you take insulin  or other diabetes medicines. Checking your blood sugar more often. Making changes to your daily life. These may include: Being more active. Eating healthier foods. Losing weight. Treating an illness or infection. Stopping or taking less steroids. If your high blood sugar stays high, you may need to be treated in the hospital. Follow these instructions at home: If you have diabetes:  Know the symptoms of high blood  sugar. Follow your diabetes care plan. Make sure you: Take insulin  and medicines as told. Check your blood sugar as often as told. Eat on time. Do not skip meals. Check your blood sugar before and after you exercise. If you exercise longer or harder than normal, check your blood sugar more often. Follow your sick day plan when you can't eat or drink like normal. Make this plan ahead of time with your provider. Share your diabetes care plan with: Your work or school. The people you live with. Wear an alert bracelet or carry a card that says you have diabetes. General  instructions Take medicines only as told by your provider. Drink enough fluid to keep your pee (urine) pale yellow. Make sure you drink enough when you: Exercise. Get sick. Are in hot places. If you drink alcohol: Limit how much you have to: 0-1 drink a day if you're male. 0-2 drinks a day if you're male. Know how much alcohol is in your drink. In the [place] one drink is one 12 oz bottle of beer (355 mL), one 5 oz glass of wine (148 mL), or one 1 oz glass of hard liquor (44 mL). Manage stress. If you need help with this, ask your provider. Exercise as told. Try to stay at a healthy weight. Keep all follow-up visits. Your provider will want to make sure your high blood sugar is treated. Where to find more information American Diabetes Association (ADA): diabetes.org Contact a health care provider if: You have diabetes and have trouble keeping your blood sugar in the right range. Your blood sugar is at or above 240 mg/dL (86.6 mmol/L) for 2 days in a row. You have high blood sugar often. You have signs of illness, such as: Nausea or vomiting. A headache. A fever. You can't stop vomiting. Get help right away if: Your blood sugar monitor reads high even when you're taking insulin . You have trouble breathing. These symptoms may be an emergency. Call 911 right away. Do not wait to see if the symptoms will go away. Do not drive yourself to the hospital. This information is not intended to replace advice given to you by your health care provider. Make sure you discuss any questions you have with your health care provider. Document Revised: 11/10/2022 Document Reviewed: 04/27/2022 Elsevier Patient Education  2024 [employer].

## 2023-09-18 NOTE — Progress Notes (Signed)
 Documentation Reason: Patient assistance  Summary of Call: Missing Information  Called and spoke with [employer].  Provider SLN expiration date missing.  Provided to [employer] representative.   Patient has been approved through 02/21/2024.  Order should be delivered within 10-14 business days.    John Andrade, PharmD Clinical Pharmacist [facility] [phone]

## 2023-09-19 ENCOUNTER — Other Ambulatory Visit: Payer: Self-pay

## 2023-09-19 MED ORDER — SHINGRIX 50 MCG/0.5ML IM SUSR
INTRAMUSCULAR | 0 refills | Status: DC
  Filled 2023-09-19: qty 0.5, 1d supply, fill #0

## 2023-10-02 ENCOUNTER — Other Ambulatory Visit: Payer: Self-pay

## 2023-10-02 DIAGNOSIS — J3089 Other allergic rhinitis: Secondary | ICD-10-CM

## 2023-10-02 MED ORDER — MONTELUKAST SODIUM 10 MG PO TABS: ORAL_TABLET | ORAL | 1 refills | Status: DC

## 2023-10-03 NOTE — Progress Notes (Signed)
 10/04/2023 Name: John Andrade MRN: [id number] DOB: October 08, 1955  Subjective  Chief Complaint  Patient presents with   Diabetes   Reason for visit: ?  John Andrade is a 68 y.o. male with a history of diabetes (type 2), who presents today for an initial diabetes pharmacotherapy visit.? 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  Known DM Complications: nephropathy with hx microalbuminuria  Care Team: Primary Care Provider: Vincente Shivers, NP   Date of Last Diabetes Related Visit: with PCP on 08/21/23  Recent Summary of Change: 8/13: Pending Ozempic  delivery, PAP has been approved.   Medication Access/Adherence: Prescription drug coverage: Payor: Advertising copywriter MEDICARE / Plan: [employer]HC MEDICARE / Product Type: *No Product type* / .  - Current Patient Assistance: Ozempic  through [employer] (awaiting first shipment)  Since Last visit / History of Present Illness: ?  Patient has been approved for Ozempic  through [employer]. Pending delivery to clinic. Otherwise, patient has no concerns or questions at this time.   Reported DM Regimen: ?  N/A - Ozempic  approved. Expected delivery ~2 weeks    DM medications tried in the past:?  Metformin  (two trials. Both with diarrhea) Ozempic  (took for ~6 weeks, stopped in January d/t deductible. Tolerated well. Notes diarrhea after stopping)  08/24/23: Initiated PAP via [employer] for Ozempic . Approved through 01/2024 [employer]  (prescribed 2025, copay too expensive)  SMBG: Not checking    Hypo/Hyperglycemia: ?  Symptoms of hypoglycemia since last visit:? No  Symptoms of hyperglycemia since last visit:? No     _______________________________________________  Objective    Review of Systems:? Limited in the setting of virtual visit  GI:? No nausea, vomiting, constipation, diarrhea, abdominal pain, dyspepsia,  change in bowel habits  Endocrine:? No polyuria, polyphagia or blurred vision   Vitals:  Wt Readings from Last 3 Encounters:  08/21/23 248 lb (112.5 kg)  05/22/23 247 lb (112 kg)  04/26/23 249 lb 5.4 oz (113.1 kg)   BP Readings from Last 3 Encounters:  08/21/23 118/68  05/22/23 120/68  04/27/23 134/81   Pulse Readings from Last 3 Encounters:  08/21/23 80  05/22/23 72  04/27/23 78     Labs:?  Lab Results  Component Value Date   HGBA1C 7.5 (H) 08/21/2023   HGBA1C 7.0 (H) 04/21/2023   HGBA1C 7.7 (H) 01/30/2023   GLUCOSE 152 (H) 08/21/2023   MICRALBCREAT 194 (H) 01/30/2023   MICRALBCREAT 96 (H) 04/07/2017   MICRALBCREAT CANCELED 05/08/2015   CREATININE 1.20 08/21/2023   CREATININE 1.07 05/22/2023   CREATININE 1.16 04/27/2023   GFR 63.09 08/21/2023   GFR 72.52 05/22/2023    Lab Results  Component Value Date   CHOL 203 (H) 08/21/2023   LDLCALC 128 (H) 08/21/2023   LDLCALC 174 (H) 01/30/2023   LDLCALC 206 (H) 04/12/2022   HDL 44.00 08/21/2023   TRIG 151.0 (H) 08/21/2023   TRIG 125 01/30/2023   TRIG 177 (H) 04/12/2022   ALT 19 08/21/2023   ALT 17 05/22/2023   AST 16 08/21/2023   AST 15 05/22/2023      Chemistry      Component Value Date/Time   NA 138 08/21/2023 1109   K 4.1 08/21/2023 1109   CL 100 08/21/2023 1109   CO2 29 08/21/2023 1109   BUN 21 08/21/2023 1109   CREATININE 1.20 08/21/2023 1109   CREATININE 1.11 01/30/2023 1049      Component Value Date/Time  CALCIUM  9.7 08/21/2023 1109   ALKPHOS 88 08/21/2023 1109   AST 16 08/21/2023 1109   ALT 19 08/21/2023 1109   BILITOT 0.6 08/21/2023 1109     The 10-year ASCVD risk score (Arnett DK, et al., 2019) is: 26.9%  Assessment and Plan:   1. Diabetes, type 2: uncontrolled per last A1c of 7.5% (6/30), increased from previous 7.0% with goal <7% without hypoglycemia. Currently untreated due to med access concerns (high deductible). Approved for PAP for Ozempic  though first still awaiting delivery of  first order.   Current Regimen: None Pending Ozempic  delivery to via [employer] PAP Future Consideration: GLP1-RA: Titration to max tolerated dose. If c/f side effects upon 0.5 mg titration, reasonable to take titrations more slowly.  SGLT2i: May be eligible for PAP. Reasonable consideration in setting of CKD w hx albuminuria.    2. Healthcare Maintenance:  Pneumococcal - Current status: complete  Shingles - Current status: Second dose not documented - Due Influenza - Current status: Up to date  Due to receive the following vaccines: Shingrix    Follow Up Reminder set for ~2 weeks to ensure Novo shipment was received.  Patient given direct line for questions regarding medication therapy and MAP applications  No future appointments.   Woodie Jock, PharmD PGY1 Pharmacy Resident

## 2023-10-04 ENCOUNTER — Other Ambulatory Visit: Admitting: Pharmacist

## 2023-10-04 DIAGNOSIS — E1122 Type 2 diabetes mellitus with diabetic chronic kidney disease: Secondary | ICD-10-CM

## 2023-10-04 DIAGNOSIS — N182 Chronic kidney disease, stage 2 (mild): Secondary | ICD-10-CM

## 2023-10-17 NOTE — Telephone Encounter (Signed)
 Do we know the status of patients shipment?

## 2023-10-24 ENCOUNTER — Encounter: Payer: Self-pay | Admitting: Pharmacist

## 2023-10-24 NOTE — Progress Notes (Signed)
 Chart Review Reason: Patient Assistance   Summary: Novo enrollment began 09/19/23.  Have continued to follow status of first shipment of Ozempic  though has remained in process due to Novo delays.   As of 10/24/23, automated systems still states that the order is in process.  Requested Novo representative to clarify expected shipment date given it has been 5 weeks in process.   Novo rep states that they are experiencing delays. She is unable to provide projected delay or how long it will be until his order is processed. She states patient is signed up for text updates regarding order status and will be texted once order ships.    Manuelita FABIENE Kobs, PharmD Clinical Pharmacist [facility] [phone]

## 2023-10-26 ENCOUNTER — Ambulatory Visit: Payer: Medicare Other | Admitting: Nurse Practitioner

## 2023-11-02 ENCOUNTER — Telehealth: Payer: Self-pay

## 2023-11-02 NOTE — Telephone Encounter (Signed)
 Ozempic  shipment received; placed in fridge and patient notified ready for pickup.

## 2023-11-02 NOTE — Telephone Encounter (Signed)
 Patient picked up medication

## 2023-11-07 ENCOUNTER — Encounter: Payer: Self-pay | Admitting: Pharmacist

## 2023-11-07 NOTE — Progress Notes (Signed)
 Brief Telephone Documentation Reason for Call: Patient assistance follow up  Summary of Call: Patient has recent picked up his first Novo shipment of Ozempic .   Called patient today to ensure to questions/concerns with resumption.   Patient confirms he gave his first dose of 0.25 mg on Sunday. As of today, no concerns with side effects.   Advised patient to continue with lowest dose, 0.25 mg once weekly over the next 3 weeks.   Follow Up: PharmD follow up via phone in 3 weeks to assess appropriateness of titration  Manuelita FABIENE Kobs, PharmD Clinical Pharmacist [facility] [phone]

## 2023-11-23 ENCOUNTER — Encounter: Payer: Self-pay | Admitting: General Practice

## 2023-11-23 DIAGNOSIS — I10 Essential (primary) hypertension: Secondary | ICD-10-CM

## 2023-11-24 MED ORDER — AMLODIPINE BESYLATE 10 MG PO TABS: ORAL_TABLET | ORAL | 1 refills | Status: AC

## 2023-11-27 ENCOUNTER — Encounter: Payer: Self-pay | Admitting: Pharmacist

## 2023-12-29 ENCOUNTER — Other Ambulatory Visit: Payer: Self-pay | Admitting: General Practice

## 2023-12-29 NOTE — Progress Notes (Signed)
 Received form from patient assistance about refill for ozempic .  Increase dose from Ozempic  0.25 mg to 0.5 mg once weekly.  Form given back to [place].   Patient needs a follow up at the end of December.   -Carrol Aurora, DNP, AGNP-C 12/29/2023 3:25 PM

## 2024-01-26 ENCOUNTER — Other Ambulatory Visit: Payer: Self-pay | Admitting: General Practice

## 2024-01-26 DIAGNOSIS — I10 Essential (primary) hypertension: Secondary | ICD-10-CM

## 2024-01-30 ENCOUNTER — Encounter: Payer: Medicare Other | Admitting: Nurse Practitioner

## 2024-02-07 ENCOUNTER — Other Ambulatory Visit: Payer: Self-pay | Admitting: General Practice

## 2024-02-07 DIAGNOSIS — I10 Essential (primary) hypertension: Secondary | ICD-10-CM

## 2024-02-08 ENCOUNTER — Encounter: Payer: Self-pay | Admitting: General Practice

## 2024-02-19 ENCOUNTER — Ambulatory Visit: Admitting: General Practice

## 2024-02-20 ENCOUNTER — Telehealth: Payer: Self-pay

## 2024-02-20 NOTE — Telephone Encounter (Signed)
 I received a reorder form for patients ozempic  from novo [employer]ord[employer]. Are the forms still necessary now that the program is ending or do they still need this filled out?

## 2024-02-20 NOTE — Telephone Encounter (Signed)
 Not sure how expensive this will now be for patient at his pharmacy. Do we need to continue patient on this? Also, patient is supposed to call back to schedule his follow up when he gets back in town next week.

## 2024-02-20 NOTE — Telephone Encounter (Signed)
 Message has already been sent to patient to schedule appt; will wait and discuss once patient calls back to schedule appt.

## 2024-02-25 ENCOUNTER — Other Ambulatory Visit: Payer: Self-pay | Admitting: General Practice

## 2024-02-26 NOTE — Telephone Encounter (Signed)
 Please call patient and schedule diabetes follow up appt

## 2024-02-29 NOTE — Telephone Encounter (Signed)
"  Appt scheduled for 03/25/24  "

## 2024-03-22 ENCOUNTER — Encounter: Payer: Self-pay | Admitting: Pharmacist

## 2024-03-22 NOTE — Progress Notes (Signed)
 Patient Assistance Program (PAP) Application   Manufacturer: Secretary/administrator    (New enrollment) Medication(s): Trulicity  (no longer able to get ozempic )  Patient Portion of Application:  03/20/24: Completed with patient via online enrollment tool. Submitted. Uploaded to Media Tab.  [id number] Income Documentation: N/A - Electronic verification elected.  Provider Portion of Application:  Online [id number]. PCP to esign eRx once requested by Med Advocate team to finalize application  Prescription(s): eRx will need to be sent to [facility]    Next Steps: []    eRx not yet sent. Awaiting Med Advocate request  Patient Advocate PAP Spreadsheet updated and Pool cc'd as FYI (application WAS submitted by clinic online)

## 2024-03-25 ENCOUNTER — Other Ambulatory Visit: Payer: Self-pay | Admitting: Pharmacist

## 2024-03-25 ENCOUNTER — Telehealth: Payer: Self-pay | Admitting: General Practice

## 2024-03-25 VITALS — Ht 64.25 in

## 2024-03-25 DIAGNOSIS — J3089 Other allergic rhinitis: Secondary | ICD-10-CM | POA: Diagnosis not present

## 2024-03-25 DIAGNOSIS — E1169 Type 2 diabetes mellitus with other specified complication: Secondary | ICD-10-CM | POA: Diagnosis not present

## 2024-03-25 DIAGNOSIS — I10 Essential (primary) hypertension: Secondary | ICD-10-CM

## 2024-03-25 DIAGNOSIS — E785 Hyperlipidemia, unspecified: Secondary | ICD-10-CM

## 2024-03-25 DIAGNOSIS — E1122 Type 2 diabetes mellitus with diabetic chronic kidney disease: Secondary | ICD-10-CM

## 2024-03-25 DIAGNOSIS — N182 Chronic kidney disease, stage 2 (mild): Secondary | ICD-10-CM

## 2024-03-25 DIAGNOSIS — Z7985 Long-term (current) use of injectable non-insulin antidiabetic drugs: Secondary | ICD-10-CM

## 2024-03-25 DIAGNOSIS — K219 Gastro-esophageal reflux disease without esophagitis: Secondary | ICD-10-CM | POA: Diagnosis not present

## 2024-03-25 MED ORDER — OMEPRAZOLE 40 MG PO CPDR: DELAYED_RELEASE_CAPSULE | ORAL | 2 refills | Status: AC

## 2024-03-25 MED ORDER — MONTELUKAST SODIUM 10 MG PO TABS: ORAL_TABLET | ORAL | 1 refills | Status: AC

## 2024-03-25 NOTE — Addendum Note (Signed)
 Addended by: VINCENTE SHIVERS on: 03/25/2024 02:00 PM   Modules accepted: Level of Service

## 2024-03-25 NOTE — Assessment & Plan Note (Signed)
 Discussed the importance of healthy diet and exercise to affect sustainable weight loss.

## 2024-03-25 NOTE — Patient Instructions (Addendum)
 Call and schedule lab only appointment.   Follow up with pharmacist regarding trulicity  and the prescription assistance.   Follow up in office in three months.  It was a pleasure to see you today!

## 2024-03-25 NOTE — Progress Notes (Unsigned)
 [facility] application submitted previously.  eRx for Trulicity  > [facility] pharmacy ([place])  Pended to PCP for review/signature.  Trulicity  0.75 mg once weekly.  x3 month supply, 0 refills.  Depending on future A1c, titration reasonable. Refills > [facility] [employer] [employer] [facility] pharmacy)

## 2024-03-26 MED ORDER — TRULICITY 0.75 MG/0.5ML ~~LOC~~ SOAJ: 0.7500 mg | SUBCUTANEOUS | 0 refills | Status: AC

## 2024-03-29 ENCOUNTER — Telehealth: Payer: Self-pay

## 2024-03-29 ENCOUNTER — Other Ambulatory Visit

## 2024-03-29 ENCOUNTER — Ambulatory Visit: Payer: Self-pay | Admitting: General Practice

## 2024-03-29 DIAGNOSIS — E1169 Type 2 diabetes mellitus with other specified complication: Secondary | ICD-10-CM

## 2024-03-29 DIAGNOSIS — I10 Essential (primary) hypertension: Secondary | ICD-10-CM

## 2024-03-29 DIAGNOSIS — E1122 Type 2 diabetes mellitus with diabetic chronic kidney disease: Secondary | ICD-10-CM

## 2024-03-29 LAB — COMPREHENSIVE METABOLIC PANEL WITH GFR
ALT: 19 U/L (ref 3–53)
AST: 19 U/L (ref 5–37)
Albumin: 4.3 g/dL (ref 3.5–5.2)
Alkaline Phosphatase: 83 U/L (ref 39–117)
BUN: 24 mg/dL — ABNORMAL HIGH (ref 6–23)
CO2: 28 meq/L (ref 19–32)
Calcium: 9.7 mg/dL (ref 8.4–10.5)
Chloride: 102 meq/L (ref 96–112)
Creatinine, Ser: 1.22 mg/dL (ref 0.40–1.50)
GFR: 61.59 mL/min
Glucose, Bld: 191 mg/dL — ABNORMAL HIGH (ref 70–99)
Potassium: 4.2 meq/L (ref 3.5–5.1)
Sodium: 140 meq/L (ref 135–145)
Total Bilirubin: 0.5 mg/dL (ref 0.2–1.2)
Total Protein: 7.7 g/dL (ref 6.0–8.3)

## 2024-03-29 LAB — CBC
HCT: 42.9 % (ref 39.0–52.0)
Hemoglobin: 14.6 g/dL (ref 13.0–17.0)
MCHC: 33.9 g/dL (ref 30.0–36.0)
MCV: 85 fl (ref 78.0–100.0)
Platelets: 284 10*3/uL (ref 150.0–400.0)
RBC: 5.05 Mil/uL (ref 4.22–5.81)
RDW: 13.4 % (ref 11.5–15.5)
WBC: 7.9 10*3/uL (ref 4.0–10.5)

## 2024-03-29 LAB — LIPID PANEL
Cholesterol: 198 mg/dL (ref 28–200)
HDL: 40 mg/dL
LDL Cholesterol: 106 mg/dL — ABNORMAL HIGH (ref 10–99)
NonHDL: 157.5
Total CHOL/HDL Ratio: 5
Triglycerides: 256 mg/dL — ABNORMAL HIGH (ref 10.0–149.0)
VLDL: 51.2 mg/dL — ABNORMAL HIGH (ref 0.0–40.0)

## 2024-03-29 LAB — HEMOGLOBIN A1C: Hgb A1c MFr Bld: 7.4 % — ABNORMAL HIGH (ref 4.6–6.5)

## 2024-03-29 NOTE — Addendum Note (Signed)
 Addended by: ISADORA RAISIN on: 03/29/2024 04:32 PM   Modules accepted: Orders

## 2024-03-29 NOTE — Addendum Note (Signed)
 Addended by: HOPE VEVA PARAS on: 03/29/2024 04:10 PM   Modules accepted: Orders

## 2024-03-29 NOTE — Telephone Encounter (Signed)
 PAP: Application for Trulicity  has been submitted to [facility], online by [employer] 03/20/24

## 2024-06-24 ENCOUNTER — Ambulatory Visit: Admitting: General Practice
